# Patient Record
Sex: Male | Born: 1935 | Race: White | Hispanic: No | Smoking: Former smoker
Health system: Southern US, Community
[De-identification: ages and names within clinical notes are randomized; demographics above are authoritative.]

## PROBLEM LIST (undated history)

## (undated) DIAGNOSIS — N529 Male erectile dysfunction, unspecified: Secondary | ICD-10-CM

## (undated) DIAGNOSIS — E785 Hyperlipidemia, unspecified: Secondary | ICD-10-CM

## (undated) DIAGNOSIS — C679 Malignant neoplasm of bladder, unspecified: Secondary | ICD-10-CM

## (undated) DIAGNOSIS — I10 Essential (primary) hypertension: Secondary | ICD-10-CM

## (undated) DIAGNOSIS — R42 Dizziness and giddiness: Secondary | ICD-10-CM

## (undated) DIAGNOSIS — E7800 Pure hypercholesterolemia, unspecified: Secondary | ICD-10-CM

## (undated) DIAGNOSIS — N393 Stress incontinence (female) (male): Secondary | ICD-10-CM

## (undated) DIAGNOSIS — M1990 Unspecified osteoarthritis, unspecified site: Secondary | ICD-10-CM

## (undated) DIAGNOSIS — B029 Zoster without complications: Secondary | ICD-10-CM

## (undated) DIAGNOSIS — N135 Crossing vessel and stricture of ureter without hydronephrosis: Secondary | ICD-10-CM

## (undated) DIAGNOSIS — K219 Gastro-esophageal reflux disease without esophagitis: Secondary | ICD-10-CM

## (undated) DIAGNOSIS — R011 Cardiac murmur, unspecified: Secondary | ICD-10-CM

## (undated) DIAGNOSIS — C61 Malignant neoplasm of prostate: Secondary | ICD-10-CM

## (undated) DIAGNOSIS — F419 Anxiety disorder, unspecified: Secondary | ICD-10-CM

## (undated) HISTORY — DX: Pure hypercholesterolemia, unspecified: E78.00

## (undated) HISTORY — DX: Dizziness and giddiness: R42

## (undated) HISTORY — DX: Malignant neoplasm of bladder, unspecified: C67.9

## (undated) HISTORY — DX: Male erectile dysfunction, unspecified: N52.9

## (undated) HISTORY — DX: Hyperlipidemia, unspecified: E78.5

## (undated) HISTORY — PX: EYE SURGERY: SHX253

## (undated) HISTORY — PX: CYSTOSCOPY: SUR368

## (undated) HISTORY — DX: Malignant neoplasm of prostate: C61

## (undated) HISTORY — DX: Stress incontinence (female) (male): N39.3

## (undated) HISTORY — PX: COLONOSCOPY: SHX174

## (undated) HISTORY — PX: CYSTOSTOMY W/ BLADDER BIOPSY: SHX1431

## (undated) HISTORY — PX: INGUINAL HERNIA REPAIR: SUR1180

## (undated) HISTORY — PX: LYMPHADENECTOMY: SHX15

## (undated) HISTORY — DX: Zoster without complications: B02.9

## (undated) HISTORY — PX: BLADDER TUMOR EXCISION: SHX238

## (undated) HISTORY — DX: Essential (primary) hypertension: I10

## (undated) HISTORY — DX: Crossing vessel and stricture of ureter without hydronephrosis: N13.5

---

## 1991-06-20 DIAGNOSIS — C679 Malignant neoplasm of bladder, unspecified: Secondary | ICD-10-CM

## 1991-06-20 DIAGNOSIS — C61 Malignant neoplasm of prostate: Secondary | ICD-10-CM

## 1991-06-20 HISTORY — DX: Malignant neoplasm of prostate: C61

## 1991-06-20 HISTORY — PX: PROSTATECTOMY: SHX69

## 1991-06-20 HISTORY — DX: Malignant neoplasm of bladder, unspecified: C67.9

## 1997-12-18 ENCOUNTER — Other Ambulatory Visit: Admission: RE | Admit: 1997-12-18 | Discharge: 1997-12-18 | Payer: Self-pay | Admitting: Urology

## 2002-01-30 ENCOUNTER — Encounter (INDEPENDENT_AMBULATORY_CARE_PROVIDER_SITE_OTHER): Payer: Self-pay | Admitting: Specialist

## 2002-01-30 ENCOUNTER — Ambulatory Visit (HOSPITAL_BASED_OUTPATIENT_CLINIC_OR_DEPARTMENT_OTHER): Admission: RE | Admit: 2002-01-30 | Discharge: 2002-01-30 | Payer: Self-pay | Admitting: Urology

## 2003-01-02 HISTORY — PX: CARDIOVASCULAR STRESS TEST: SHX262

## 2003-08-06 ENCOUNTER — Ambulatory Visit (HOSPITAL_BASED_OUTPATIENT_CLINIC_OR_DEPARTMENT_OTHER): Admission: RE | Admit: 2003-08-06 | Discharge: 2003-08-06 | Payer: Self-pay | Admitting: Urology

## 2003-08-06 ENCOUNTER — Encounter (INDEPENDENT_AMBULATORY_CARE_PROVIDER_SITE_OTHER): Payer: Self-pay | Admitting: Specialist

## 2003-08-06 ENCOUNTER — Ambulatory Visit (HOSPITAL_COMMUNITY): Admission: RE | Admit: 2003-08-06 | Discharge: 2003-08-06 | Payer: Self-pay | Admitting: Urology

## 2007-09-17 ENCOUNTER — Encounter: Admission: RE | Admit: 2007-09-17 | Discharge: 2007-09-17 | Payer: Self-pay | Admitting: Urology

## 2007-09-19 ENCOUNTER — Ambulatory Visit (HOSPITAL_BASED_OUTPATIENT_CLINIC_OR_DEPARTMENT_OTHER): Admission: RE | Admit: 2007-09-19 | Discharge: 2007-09-19 | Payer: Self-pay | Admitting: Urology

## 2007-09-19 ENCOUNTER — Encounter (INDEPENDENT_AMBULATORY_CARE_PROVIDER_SITE_OTHER): Payer: Self-pay | Admitting: Urology

## 2007-10-03 ENCOUNTER — Encounter: Admission: RE | Admit: 2007-10-03 | Discharge: 2007-10-03 | Payer: Self-pay | Admitting: Gastroenterology

## 2008-01-20 ENCOUNTER — Ambulatory Visit (HOSPITAL_BASED_OUTPATIENT_CLINIC_OR_DEPARTMENT_OTHER): Admission: RE | Admit: 2008-01-20 | Discharge: 2008-01-20 | Payer: Self-pay | Admitting: Urology

## 2008-01-20 ENCOUNTER — Encounter (INDEPENDENT_AMBULATORY_CARE_PROVIDER_SITE_OTHER): Payer: Self-pay | Admitting: Urology

## 2008-08-13 ENCOUNTER — Encounter (INDEPENDENT_AMBULATORY_CARE_PROVIDER_SITE_OTHER): Payer: Self-pay | Admitting: Urology

## 2008-08-13 ENCOUNTER — Ambulatory Visit (HOSPITAL_BASED_OUTPATIENT_CLINIC_OR_DEPARTMENT_OTHER): Admission: RE | Admit: 2008-08-13 | Discharge: 2008-08-13 | Payer: Self-pay | Admitting: Urology

## 2010-01-20 ENCOUNTER — Ambulatory Visit: Payer: Self-pay | Admitting: Cardiology

## 2010-07-14 ENCOUNTER — Ambulatory Visit: Payer: Self-pay | Admitting: Cardiology

## 2010-10-04 LAB — POCT I-STAT, CHEM 8
BUN: 23 mg/dL (ref 6–23)
Calcium, Ion: 1.23 mmol/L (ref 1.12–1.32)
Creatinine, Ser: 1.4 mg/dL (ref 0.4–1.5)
Hemoglobin: 15.3 g/dL (ref 13.0–17.0)
TCO2: 24 mmol/L (ref 0–100)

## 2010-11-01 NOTE — Op Note (Signed)
NAME:  CHANDLER, SWIDERSKI NO.:  [id number]   MEDICAL RECORD NO.:  [id number]          PATIENT TYPE:  AMB   LOCATION:  [facility]                         FACILITY:  [facility]   PHYSICIAN:  Heloise Purpura, MD      DATE OF BIRTH:  10-Sep-1930   DATE OF PROCEDURE:  01/20/2008  DATE OF DISCHARGE:                               OPERATIVE REPORT   PREOPERATIVE DIAGNOSES:  1. Bladder cancer.  2. Urethral stricture.   POSTOPERATIVE DIAGNOSES:  1. Bladder cancer.  2. Urethral stricture.   PROCEDURE:  1. Cystoscopy.  2. Bilateral retrograde pyelography.  3. Bladder biopsy with removal of small bladder tumor transurethrally.   SURGEON:  Heloise Purpura, MD.   ANESTHESIA:  General.   COMPLICATIONS:  None.   SPECIMENS:  1. Bladder tumor.  2. Posterior bladder biopsy.   DISPOSITION OF SPECIMENS:  To pathology.   INDICATIONS:  Mr. 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  He recently underwent  surveillance cystoscopy in the office which demonstrated an erythematous  area within the bladder concerning for possible carcinoma in situ.  In  addition, he had a bladder washing for cytology that was found to be  suspicious for urothelial carcinoma.  It was therefore recommended that  he undergo a bladder biopsy.  The potential risks, complications, and  alternative options associated with this procedure were discussed with  the patient and informed consent was obtained.   DESCRIPTION OF PROCEDURE:  The patient was taken to the operating room  and a general anesthetic was administered.  He was given preoperative  antibiotics, placed in the dorsal lithotomy position, prepped and draped  in the usual sterile fashion.  Next, a preoperative time-out was  performed.  Cystourethroscopy was then performed.  The patient was noted  to have a small urethral stricture, although this was able to be  bypassed with the 22-French cystoscope  sheath.  The bladder was then  systematically examined and demonstrated some moderate-sized cellules,  as well as moderate trabeculation throughout the bladder.  There was  noted to be an erythematous area in the posterior aspect of the bladder  consistent with the patient's office cystoscopy.  However, in addition,  there was noted to be a very small papillary tumor off the right lateral  aspect of the bladder just above the trigone.  This tumor measured less  than 0.5 cm.  Bilateral retrograde pyelography was then performed and  the ureteral orifices were intubated with a cone-tip catheter.  The left  ureteral orifice was first intubated and contrast was injected which  demonstrated no evidence of filling defects or abnormalities of the  ureter.  The renal collecting system also demonstrated no evidence of  filling defects or dilation.  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  On the right side, contrast was injected through the cone-tip  catheter via the ureteral orifice and again, no ureteral filling defects  or abnormalities were identified.  There was noted to be some splaying  of the collecting system extrinsically, but  no filling defects on the  urothelial surface or other abnormalities except again, for what  appeared to be a bifid collecting system.  Attention then returned to  the bladder and the previously mentioned bladder tumor was  transurethrally resected with the cold cup biopsy forceps including deep  section to ensure muscle was resected below this area.  The previously  mentioned erythematous area was also biopsied.  The Bugbee electrode was  then used to fulgurate each of these areas along with a surrounding  erythema.  The bladder was emptied and re-inspected.  Hemostasis was  ensured.  The bladder was then emptied and the cystoscope was removed.  The patient tolerated the procedure well and without complications.  Although, I would of ideally liked to  administer intravesical mitomycin  C in this situation, it is not currently available due to a regional  shortage.  Therefore, the patient was transferred to the recovery unit  without a catheter and in stable condition.      Heloise Purpura, MD  Electronically Signed     LB/MEDQ  D:  01/20/2008  T:  01/20/2008  Job:  [id number]

## 2010-11-01 NOTE — Op Note (Signed)
NAMEMarland Kitchen  VIBHAV, Robert Bright NO.:  [id number]   MEDICAL RECORD NO.:  [id number]          PATIENT TYPE:  AMB   LOCATION:  [facility]                         FACILITY:  [facility]   PHYSICIAN:  Heloise Purpura, MD      DATE OF BIRTH:  17-Jul-1930   DATE OF PROCEDURE:  09/19/2007  DATE OF DISCHARGE:                               OPERATIVE REPORT   PREOPERATIVE DIAGNOSES:  1. Bladder cancer.  2. 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   POSTOPERATIVE DIAGNOSES:  1. Bladder cancer.  2. 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   PROCEDURES:  1. Cystoscopy.  2. Balloon dilation of bladder neck contracture.  3. Bilateral retrograde pyelography.  4. Transurethral resection of small bladder tumor.  5. Bladder washing for cytology.  6. Eamination under anesthesia.   SURGEON:  Heloise Purpura, M.D.   ANESTHESIA:  General.   COMPLICATIONS:  None.   ESTIMATED BLOOD LOSS:  Minimal.   SPECIMENS:  Left lateral bladder tumor.   DISPOSITION OF SPECIMEN:  To pathology.   INDICATIONS FOR PROCEDURE:  Robert Bright is a 77 year old gentleman with a  history of prostate and bladder cancer.  He last underwent a  transurethral resection of his bladder tumor in 2005.  He also has a  history of a severe bladder neck contracture as a result of his radical  prostatectomy.  He, therefore, is unable to undergo surveillance  cystoscopy in the office.  After evaluating him recently, I recommended  that we proceed with surveillance cystoscopy at this time and he,  therefore, gave informed consent after discussing the risks, benefits,  potential complications, and alternative options.   DESCRIPTION OF PROCEDURE:  The patient was taken to the operating room  and a general anesthetic was administered. He was given preoperative  antibiotics, placed in the dorsal lithotomy position and prepped and  draped in the usual sterile fashion.   Next, a preoperative time-out was  performed.  Urethroscopy was then performed which demonstrated a normal  anterior urethra.  On inspection of the bladder neck there was noted to  be a very tight bladder neck contracture.  A sensor guide wire was  advanced past this bladder neck contracture and into the bladder under  fluoroscopic guidance.  The Ultraxx balloon dilator was then used to  balloon dilate the bladder neck contracture to 24 [place].  This dilated  easily and without resistance.  The balloon was left inflated for  approximately 2-1/2 minutes. The balloon was then deflated and  urethroscopy revealed a patent bladder neck and entry into the bladder  was performed without difficulty.   The bladder was then systematically examined which demonstrated the  ureteral orifices in the expected positions and effluxing clear urine.  There was noted to be a little bit of a mucosal bridge in the midline of  the trigone, likely a postoperative change from his radical  prostatectomy.  On inspection of the bladder, there was noted to be what  appeared to be  a low-grade bladder tumor on the left lateral wall  measuring approximately 1.5 cm.  No other mucosal lesions were  identified. A saline bladder washing was obtained.  The left ureteral  orifice was intubated with a cone tipped catheter and contrast was  injected.  Retrograde pyelography demonstrated no evidence of any  filling defects or dilation within the renal collecting system or  ureter.  An identical procedure was then performed on the contralateral  side and again no filling defects or evidence of dilation was noted in  the renal collecting system or ureter.   Attention then returned to the bladder.  Using the cold cup forceps, the  patient's bladder tumor was transurethrally resected and removed in its  entirety.  The resection site was then fulgurated with the Bugbee  electrode. The bladder was emptied and reinspected and there  was no  evidence of any bleeding. Pelvic exam revealed no pelvic or bladder  masses. A 16 French Foley catheter was then replaced and 40 mg of  Mitomycin and 20 mL of sterile water was instilled into the patient's  bladder for postoperative instillation.  The patient tolerated the  procedure well and without complications.  He was able to be awakened  and transferred to the recovery unit in satisfactory condition.      Heloise Purpura, MD  Electronically Signed     LB/MEDQ  D:  09/19/2007  T:  09/19/2007  Job:  [id number]

## 2010-11-01 NOTE — Op Note (Signed)
NAME:  Robert Bright, Robert Bright NO.:  [id number]   MEDICAL RECORD NO.:  [id number]          PATIENT TYPE:  AMB   LOCATION:  [facility]                         [facility]ACILITY:  [facility]   PHYSICIAN:  Heloise Purpura, MD      DATE OF BIRTH:  [birth year]   DATE OF PROCEDURE:  08/13/2008  DATE OF DISCHARGE:                               OPERATIVE REPORT   PREOPERATIVE DIAGNOSES:  1. Bladder neck contracture.  2. Bladder cancer.   POSTOPERATIVE DIAGNOSES:  1. Bladder neck contracture.  2. Bladder cancer.   PROCEDURES:  1. Cystoscopy.  2. Saline bladder washing for cytology.  3. Bilateral retrograde pyelography with interpretation.  4. Bladder biopsies with fulguration.  5. Balloon dilation of bladder neck contracture.   SURGEON:  Heloise Purpura, MD.   ANESTHESIA:  General.   COMPLICATIONS:  None.   INDICATION:  Robert Bright is a 77 year old gentleman with a history of  prostate cancer, status post a radical retropubic prostatectomy, and  subsequent development of a bladder neck contracture.  He also has a  known history of low-grade TA urothelial carcinoma of the bladder.  Due  to his recurrent bladder neck contracture, he has been unable to undergo  recent office cystoscopic surveillance for his bladder cancer.  Therefore, it was recommended that he undergo balloon dilation of his  bladder neck contracture to allow surveillance cystoscopy and possible  bladder biopsies and evaluation of his urothelium as indicated.  The  potential risks, complications, and alternative treatment options  associated with the above procedures were discussed in detail with the  patient and informed consent was obtained.   DESCRIPTION OF PROCEDURE:  The patient was taken to the operating room  and a general anesthetic was administered.  He was given preoperative  antibiotics, placed in the dorsal lithotomy position, and prepped and  draped in the usual sterile fashion.  Next a preoperative time-out was  performed.  Cystourethroscopy was then attempted with a 22-French  cystoscope sheath.  The anterior urethra appeared normal.  The patient  was noted to have a high bladder neck contracture that would not allow  the 22-French cystoscope sheath to pass into the bladder.  Therefore, a  sensor guidewire was passed through the cystoscope and into the bladder  under fluoroscopic guidance.  The wire was confirmed to be in the  appropriate position and the 24-French Ultraxx balloon dilator was  passed over the wire and positioned at the bladder neck.  It was then  inflated to 14 atmospheres of pressure, which eliminated all waste from  the balloon.  This was left inflated for 5 minutes.  The balloon was  then deflated and the 22-French cystoscope sheath was able to be passed  easily into the bladder.  The bladder was then systematically examined.  The left ureteral orifice was noted to be in the normal anatomic  position and was effluxing clear urine.  The right ureteral orifice  appeared to be in its normal position, although somewhat wide mouthed.  No clear efflux was seen from this orifice.  A systematic examination of  the bladder revealed a  small papillary lesion at the trigone of the  bladder concerning for, although not necessarily diagnostic for a low-  grade bladder tumor.  This mass appeared to be approximately 1 cm.  There was also noted to be a slightly erythematous area over a previous  biopsy resection site off the left lateral bladder wall.  No other  suspicious lesions were noted.  A saline bladder washing was then  obtained.  A cone-tip catheter was then used to intubate the left  ureteral orifice and Omnipaque contrast was injected.  This demonstrated  no filling defects within the renal pelvis or ureter and no other  abnormalities or dilation appreciated.  Attention then turned to what  was thought to be the right ureteral orifice and this was found to be a  mucosal tissue  bridge without a true orifice.  On further inspection,  the actual ureteral orifice was noted to be displaced laterally, which  appeared to be related to his prior bladder tumor resections.  This was  able to be cannulated with the cone-tip catheter and contrast was  injected.  This demonstrated some splaying of the renal collecting  system as has been previously noted.  However, there were not noted to  be any filling defects or other abnormalities of the renal collecting  system or ureter.  Attention then returned to the bladder.  The  previously mentioned papillary lesion was biopsied, which allowed its  removal in its entirety.  The left lateral erythematous area was also  biopsied and both of these were sent for permanent pathologic analysis  as separate specimens.  These areas were then fulgurated with a Bugbee  electrode.  The bladder was then emptied and reinspected.  Hemostasis  appeared excellent.  Based on ease of the dilation of his bladder neck,  it was decided not to leave a Foley catheter.  Although the patient was  suspected to have a possible bladder cancer recurrence, this was not  definite diagnosis and I did not wish to administer postoperative  intravesical chemotherapy.  In addition, there is currently a shortage  of mitomycin C, which would have made this an impossibility at this  time.   DISPOSITION:  Robert Bright will follow up in the next 2-3 weeks for further  postoperative evaluation and to discuss his pathology results.      Heloise Purpura, MD  Electronically Signed     LB/MEDQ  D:  08/13/2008  T:  08/14/2008  Job:  [id number]

## 2010-11-04 NOTE — Op Note (Signed)
Robert Bright, Penley                ACCOUNT NO.:  [id number]   MEDICAL RECORD NO.:  [id number]                    PATIENT TYPE:   LOCATION:                                       FACILITY:   PHYSICIAN:  Claudette Laws, M.D.               DATE OF BIRTH:   DATE OF PROCEDURE:  01/30/2002  DATE OF DISCHARGE:                                 OPERATIVE REPORT   PREOPERATIVE DIAGNOSES:  1. Bladder neck contracture.  2. Hematuria.  3. Remote history of radical prostatectomy, rule out bladder tumor.   POSTOPERATIVE DIAGNOSES:  1. Bladder neck contracture.  2. Hematuria.  3. Remote history of radical prostatectomy, rule out bladder tumor.  4. Papillary bladder tumor posterior wall.   OPERATION:  Cystoscopy and dilate bladder neck contracture cystoscopy and  cold cup biopsy medium size bladder tumor along with Bugbee fulguration  bladder tumor.   SURGEON:  Claudette Laws, M.D.   HISTORY:  This is a 77 year old man who had a radical prostatectomy  approximately 10 years ago for prostate cancer. Since then, he has done well  although recently we noted some hematuria. Urine cytology was suggestive of  abnormal cells; however, cystoscopic evaluation in the office revealed a  bladder neck contracture and therefore he was brought in as an outpatient  for further evaluation.   DESCRIPTION OF PROCEDURE:  The patient was prepped and draped in the dorsal  lithotomy position under intubated general anesthesia. Cystoscopy showed a  normal anterior urethra but a surgically absent prostate and a bladder neck  contracture which I was able to intubate with a 0.38 guidewire. Over the  guidewire, I passed [employer] sounds and we were able to dilate him to a #26  [place]. There was some hang-up at the bladder neck.   Then I passed the 22 French cystoscope along side the guidewire and was able  to intubate the bladder then. Careful inspection revealed a markedly  trabeculated bladder and up  along the posterior wall to the right side was a  papillary bladder tumor. We took pictures and then using the cold cup biopsy  took some appropriate deep biopsies. I then converted to a Bugbee electrode  and at 40 coag fulgurated the area thoroughly. There was no other evidence  of any tumors or suspicious areas.   We then passed a 20 [place]-tip catheter over the guidewire into the  bladder, irrigated freely, urine was clear. A B&O suppository was placed per  rectum for anesthesia and the patient was then taken back to the recovery  room in satisfactory condition.                                               Claudette Laws, M.D.   RFS/MEDQ  D:  01/30/2002  T:  02/01/2002  Job:  [id number]

## 2010-11-04 NOTE — Op Note (Signed)
NAME:  Robert Bright, Robert Bright                         ACCOUNT NO.:  [id number]   MEDICAL RECORD NO.:  [id number]                   PATIENT TYPE:  AMB   LOCATION:  [facility]                                 [facility]ACILITY:  [facility]   PHYSICIAN:  Claudette Laws, M.D.               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   SURGEON:  Claudette Laws, M.D.   ASSISTANT:  Susanne Borders, MD   PROCEDURE:  Cystoscopy, balloon dilation of bladder neck contracture,  transurethral resection of bladder tumor, small, bladder biopsies x2.   ANESTHESIA:  Laryngeal mask airway.   COMPLICATIONS:  None.   SPECIMENS:  1. Bladder dome tumor.  2. Midline bladder biopsy.  3. Left lateral wall bladder biopsy.   ESTIMATED BLOOD LOSS:  Minimal.   DISPOSITION:  To recovery room in stable condition.   BRIEF INDICATIONS FOR PROCEDURE:  Robert Bright is a 77 year old male with a  history of radical retropubic prostatectomy several years ago.  He has a  known bladder neck contracture.  The patient also has a history of a low  grade superficial bladder tumor.  Recently the patient had recurrence of  microhematuria.  He has been unable to undergo flexible cystoscopy in the  office because of his bladder neck contracture. For this reason, he was set  up for balloon dilation of his bladder neck contracture in the operating  room with cystoscopy and possible transurethral resection of bladder tumor.  The patient has consented to this after understanding the risks, benefits,  and alternatives.   DESCRIPTION OF PROCEDURE:  The patient was brought to the operating room and  correctly identified by his identification bracelet. He was given  preoperative antibiotics and general endotracheal anesthesia.  He was placed  in dorsal lithotomy position and prepped and draped in typical sterile  fashion.  A 22 French cystoscope with 12 degree lens was used to enter the  patient's anterior urethra.  At the level of the membranous urethra, there  was an obvious bladder neck contracture at the anastomosis to his bladder.  The cystoscope could not be passed through this tight area.  A 0.38 mm  guidewire was carefully guided through the strictured area and into the  bladder. A balloon dilator was then threaded over the guidewire and inflated  at 15 mL of water for 2 minutes. The balloon was then released and the  balloon dilator was removed.  The cystoscope could then easily be placed  through the bladder neck contracture and into the  bladder.  The ureteral  orifices were identified in their normal anatomic location. The patient did  have some trabeculation throughout his bladder. On the left lateral wall,  there was an area of heaped up mucosa that had the appearance of cystitis  cystica.  There was also an erythematous area of mucosa directly posterior  in the midline.  At the dome of the bladder, there was a small papillary  lesion consistent with a low grade transitional cell carcinoma.  There were  no other noted abnormalities within the bladder.  Each of these areas was  carefully biopsied with the cold cup biopsy forceps. The tumor at the dome  of the bladder was grasped multiple times with the cold cup biopsy forceps.  The area at the dome of the bladder was then carefully fulgurated with the  Bugbee electrode. The entire surface area of this lesion was less than 2 cm.  The other areas were then carefully fulgurated with the Bugbee as well.  After fulguration, there was no bleeding noted  with the flow off. The  patient's bladder was then drained and the cystoscope was replaced within  the bladder and the lesions were carefully inspected for bleeding and none  was identified.  The patient's bladder was then drained and his urethra was  injected with 10 mL of viscus lidocaine. A B&O suppository was administered.  The patient was awakened from his anesthesia without complications and taken  to the post anesthesia care unit in stable condition.  Please note that Dr.  Etta Bright was present and participated in all aspects of this case as he is the  primary surgeon.     Susanne Borders, MD                           Claudette Laws, M.D.    DR/MEDQ  D:  08/06/2003  T:  08/06/2003  Job:  [id number]

## 2010-11-10 ENCOUNTER — Telehealth: Payer: Self-pay | Admitting: Cardiology

## 2010-11-10 NOTE — Telephone Encounter (Signed)
No answer

## 2010-11-10 NOTE — Telephone Encounter (Signed)
WANTS TO KNOW IF YOU CAN GET HIM A AUDIOLOGY REFERRAL AT [facility] FOR HIM.

## 2010-11-10 NOTE — Telephone Encounter (Signed)
Spoke with patient, will call in am to schedule

## 2010-11-17 ENCOUNTER — Telehealth: Payer: Self-pay | Admitting: *Deleted

## 2010-11-17 NOTE — Telephone Encounter (Signed)
Adv. Patient of audiology appt. 11/23/2010 at 3:00 with [facility] located [address].

## 2010-11-23 ENCOUNTER — Ambulatory Visit: Payer: Self-pay | Admitting: Unknown Physician Specialty

## 2010-12-27 ENCOUNTER — Ambulatory Visit: Payer: Medicare Other | Attending: Cardiology | Admitting: Audiology

## 2010-12-27 DIAGNOSIS — H903 Sensorineural hearing loss, bilateral: Secondary | ICD-10-CM | POA: Insufficient documentation

## 2011-01-16 ENCOUNTER — Encounter: Payer: Self-pay | Admitting: Cardiology

## 2011-01-17 ENCOUNTER — Encounter: Payer: Self-pay | Admitting: Cardiology

## 2011-01-18 ENCOUNTER — Encounter: Payer: Self-pay | Admitting: Cardiology

## 2011-01-18 ENCOUNTER — Ambulatory Visit (INDEPENDENT_AMBULATORY_CARE_PROVIDER_SITE_OTHER): Payer: Medicare Other | Admitting: Cardiology

## 2011-01-18 ENCOUNTER — Ambulatory Visit (INDEPENDENT_AMBULATORY_CARE_PROVIDER_SITE_OTHER): Payer: Medicare Other | Admitting: *Deleted

## 2011-01-18 DIAGNOSIS — C61 Malignant neoplasm of prostate: Secondary | ICD-10-CM

## 2011-01-18 DIAGNOSIS — E878 Other disorders of electrolyte and fluid balance, not elsewhere classified: Secondary | ICD-10-CM

## 2011-01-18 DIAGNOSIS — I119 Hypertensive heart disease without heart failure: Secondary | ICD-10-CM

## 2011-01-18 DIAGNOSIS — E785 Hyperlipidemia, unspecified: Secondary | ICD-10-CM

## 2011-01-18 DIAGNOSIS — I10 Essential (primary) hypertension: Secondary | ICD-10-CM

## 2011-01-18 DIAGNOSIS — E7800 Pure hypercholesterolemia, unspecified: Secondary | ICD-10-CM

## 2011-01-18 DIAGNOSIS — Z8551 Personal history of malignant neoplasm of bladder: Secondary | ICD-10-CM | POA: Insufficient documentation

## 2011-01-18 DIAGNOSIS — Z8546 Personal history of malignant neoplasm of prostate: Secondary | ICD-10-CM | POA: Insufficient documentation

## 2011-01-18 LAB — CBC WITH DIFFERENTIAL/PLATELET
Basophils Relative: 0.4 % (ref 0.0–3.0)
Eosinophils Relative: 3.3 % (ref 0.0–5.0)
HCT: 44 % (ref 39.0–52.0)
Hemoglobin: 14.8 g/dL (ref 13.0–17.0)
Lymphs Abs: 2.3 10*3/uL (ref 0.7–4.0)
MCV: 90.3 fl (ref 78.0–100.0)
Monocytes Absolute: 0.7 10*3/uL (ref 0.1–1.0)
Neutro Abs: 3.8 10*3/uL (ref 1.4–7.7)
Neutrophils Relative %: 53.5 % (ref 43.0–77.0)
RBC: 4.87 Mil/uL (ref 4.22–5.81)
WBC: 7.1 10*3/uL (ref 4.5–10.5)

## 2011-01-18 NOTE — Assessment & Plan Note (Signed)
The patient is seen for followup of his essential hypertension.  He has been feeling well.  He is not experiencing any dizziness or syncope.  He denies any chest pain or shortness of breath.  He is not having any side effects from his medication

## 2011-01-18 NOTE — Progress Notes (Signed)
Harriette Bouillon Date of Birth:  05/24/1931 [facility] / [facility] [address]   [address] [place], [place]  [address] [phone]           Fax   [phone]  History of Present Illness: This pleasant gentleman is seen for a six-month followup office visit.  He has a past history of essential hypertension and hypercholesterolemia.  He has not been expressing any recent chest pain or shortness of breath.  He does not have known ischemic heart disease and had a normal nuclear stress test in 2004.  Recently he's been having some problems with nasal obstruction of his left nostril.  We checked it and appears to have some swelling of his nasal turbinates and he may benefit from seeing an ENT physician.  He knows Dr. Suzanna Obey and he may call us to refer him to Dr. Jearld Fenton which will be fine.  At first the patient will try some over-the-counter Claritin to see if that will help.  Current Outpatient Prescriptions  Medication Sig Dispense Refill  . amLODipine (NORVASC) 5 MG tablet Take 5 mg by mouth daily.        [name] aspirin 81 MG tablet Take 81 mg by mouth daily.        [name] atorvastatin (LIPITOR) 80 MG tablet Take 80 mg by mouth daily.        [name] CALCIUM PO Take by mouth.        . Cholecalciferol (VITAMIN D PO) Take 1,000 mg by mouth daily.        . ferrous sulfate 325 (65 FE) MG tablet Take 325 mg by mouth daily.        . hydrochlorothiazide 25 MG tablet Take 25 mg by mouth every other day.        [name] LORazepam (ATIVAN) 0.5 MG tablet Take 0.5 mg by mouth as needed.        . MECLIZINE HCL PO Take by mouth.        . metoprolol tartrate (LOPRESSOR) 25 MG tablet Take 25 mg by mouth as needed.        . Multiple Vitamin (MULTIVITAMIN PO) Take by mouth.        . valsartan (DIOVAN) 80 MG tablet Take 80 mg by mouth daily.          Allergies  Allergen Reactions  . Altace     cough    Patient Active Problem List  Diagnoses  . Hypercholesterolemia  . Benign hypertensive heart  disease without heart failure  . Hx of bladder cancer  . History of prostate cancer    History  Smoking status  . Former Smoker  . Quit date: 01/16/1986  Smokeless tobacco  . Not on file    History  Alcohol Use No    No family history on file.  Review of Systems: Constitutional: no fever chills diaphoresis or fatigue or change in weight.  Head and neck: no hearing loss, no epistaxis, no photophobia or visual disturbance. Respiratory: No cough, shortness of breath or wheezing. Cardiovascular: No chest pain peripheral edema, palpitations. Gastrointestinal: No abdominal distention, no abdominal pain, no change in bowel habits hematochezia or melena. Genitourinary: No dysuria, no frequency, no urgency, no nocturia. Musculoskeletal:No arthralgias, no back pain, no gait disturbance or myalgias. Neurological: No dizziness, no headaches, no numbness, no seizures, no syncope, no weakness, no tremors. Hematologic: No lymphadenopathy, no easy bruising. Psychiatric: No confusion, no hallucinations, no sleep disturbance.    Physical Exam:  Filed Vitals:   01/18/11 0839  BP: 110/65  Pulse: 66  The general appearance reveals a well-developed well-nourished gentleman in no distress.Pupils equal and reactive.   Extraocular Movements are full.  There is no scleral icterus.The nose reveals that the turbinates appear to be somewhat swollen and there appears to be limited room for air passage particularly on the left  The mouth and pharynx are normal.  The neck is supple.  The carotids reveal no bruits.  The jugular venous pressure is normal.  The thyroid is not enlarged.  There is no lymphadenopathy.The chest is clear to percussion and auscultation. There are no rales or rhonchi. Expansion of the chest is symmetrical.  The precordium is quiet.  The first heart sound is normal.  The second heart sound is physiologically split.  There is no murmur gallop rub or click.  There is no abnormal lift or  heave.  The abdomen is soft and nontender. Bowel sounds are normal. The liver and spleen are not enlarged. There Are no abdominal masses. There are no bruits.  The pedal pulses are good.  There is no phlebitis or edema.  There is no cyanosis or clubbing.  Strength is normal and symmetrical in all extremities.  There is no lateralizing weakness.  There are no sensory deficits.       Assessment / Plan:  Continue same medication.  Lab work is pending.  Recheck in 6 months for office visit and fasting lab work

## 2011-01-18 NOTE — Assessment & Plan Note (Signed)
The patient has a history of bladder cancer as well as previous prostate cancer and both of these are being followed by his urologist Dr. Laverle Patter.  The patient states that his recent reports have been good.

## 2011-01-18 NOTE — Assessment & Plan Note (Signed)
The patient has a history of hypercholesterolemia.  He is on Lipitor 80 mg daily.  He is not having any myalgias or other side effects from his statin therapy.

## 2011-01-19 LAB — COMPREHENSIVE METABOLIC PANEL
Alkaline Phosphatase: 48 U/L (ref 39–117)
BUN: 29 mg/dL — ABNORMAL HIGH (ref 6–23)
Glucose, Bld: 78 mg/dL (ref 70–99)
Total Bilirubin: 0.8 mg/dL (ref 0.3–1.2)

## 2011-01-30 ENCOUNTER — Telehealth: Payer: Self-pay | Admitting: *Deleted

## 2011-01-30 NOTE — Telephone Encounter (Signed)
Advised patient of labs and mailed copy.Patient will have lipids done at Dec. appt

## 2011-02-24 ENCOUNTER — Other Ambulatory Visit: Payer: Self-pay | Admitting: Cardiology

## 2011-02-24 DIAGNOSIS — E7800 Pure hypercholesterolemia, unspecified: Secondary | ICD-10-CM

## 2011-02-24 DIAGNOSIS — I119 Hypertensive heart disease without heart failure: Secondary | ICD-10-CM

## 2011-03-14 LAB — POCT I-STAT, CHEM 8
Creatinine, Ser: 1.5
Glucose, Bld: 85
Hemoglobin: 15
Potassium: 4.3

## 2011-03-17 LAB — POCT I-STAT 4, (NA,K, GLUC, HGB,HCT): Glucose, Bld: 88

## 2011-05-22 ENCOUNTER — Ambulatory Visit: Payer: Medicare Other | Admitting: *Deleted

## 2011-05-22 ENCOUNTER — Ambulatory Visit (INDEPENDENT_AMBULATORY_CARE_PROVIDER_SITE_OTHER): Payer: Medicare Other | Admitting: Cardiology

## 2011-05-22 ENCOUNTER — Encounter: Payer: Self-pay | Admitting: Cardiology

## 2011-05-22 ENCOUNTER — Other Ambulatory Visit: Payer: Medicare Other | Admitting: *Deleted

## 2011-05-22 ENCOUNTER — Ambulatory Visit: Payer: Medicare Other | Admitting: Cardiology

## 2011-05-22 ENCOUNTER — Other Ambulatory Visit (INDEPENDENT_AMBULATORY_CARE_PROVIDER_SITE_OTHER): Payer: Medicare Other | Admitting: *Deleted

## 2011-05-22 VITALS — BP 134/70 | HR 66 | Wt 171.0 lb

## 2011-05-22 DIAGNOSIS — N529 Male erectile dysfunction, unspecified: Secondary | ICD-10-CM

## 2011-05-22 DIAGNOSIS — E7800 Pure hypercholesterolemia, unspecified: Secondary | ICD-10-CM

## 2011-05-22 DIAGNOSIS — F411 Generalized anxiety disorder: Secondary | ICD-10-CM

## 2011-05-22 DIAGNOSIS — E785 Hyperlipidemia, unspecified: Secondary | ICD-10-CM

## 2011-05-22 DIAGNOSIS — I119 Hypertensive heart disease without heart failure: Secondary | ICD-10-CM

## 2011-05-22 DIAGNOSIS — Z8546 Personal history of malignant neoplasm of prostate: Secondary | ICD-10-CM

## 2011-05-22 DIAGNOSIS — F419 Anxiety disorder, unspecified: Secondary | ICD-10-CM

## 2011-05-22 LAB — BASIC METABOLIC PANEL
GFR: 50.34 mL/min — ABNORMAL LOW (ref >60.00)
Glucose, Bld: 90 mg/dL (ref 70–99)
Potassium: 4.3 mEq/L (ref 3.5–5.1)
Sodium: 141 mEq/L (ref 135–145)

## 2011-05-22 LAB — HEPATIC FUNCTION PANEL
ALT: 24 U/L (ref 0–53)
Albumin: 3.9 g/dL (ref 3.5–5.2)
Total Bilirubin: 0.4 mg/dL (ref 0.3–1.2)
Total Protein: 7.1 g/dL (ref 6.0–8.3)

## 2011-05-22 LAB — LIPID PANEL
Cholesterol: 195 mg/dL (ref 0–200)
HDL: 59.1 mg/dL (ref >39.00)
VLDL: 28.8 mg/dL (ref 0.0–40.0)

## 2011-05-22 MED ORDER — SILDENAFIL CITRATE 50 MG PO TABS: 50.0000 mg | ORAL_TABLET | Freq: Every day | ORAL | Status: DC | PRN

## 2011-05-22 MED ORDER — LORAZEPAM 0.5 MG PO TABS: 0.5000 mg | ORAL_TABLET | ORAL | Status: DC | PRN

## 2011-05-22 MED ORDER — HYDROCHLOROTHIAZIDE 25 MG PO TABS: 25.0000 mg | ORAL_TABLET | ORAL | Status: DC

## 2011-05-22 MED ORDER — AMLODIPINE BESYLATE 5 MG PO TABS: 5.0000 mg | ORAL_TABLET | Freq: Every day | ORAL | Status: DC

## 2011-05-22 MED ORDER — ATORVASTATIN CALCIUM 40 MG PO TABS: 40.0000 mg | ORAL_TABLET | Freq: Every day | ORAL | Status: DC

## 2011-05-22 MED ORDER — VALSARTAN 80 MG PO TABS: 80.0000 mg | ORAL_TABLET | Freq: Every day | ORAL | Status: DC

## 2011-05-22 NOTE — Patient Instructions (Addendum)
Will obtain labs today and call you with the results Decrease Lipitor to 40 mg daily. Your physician wants you to follow-up in: 4 months with fasting labs You will receive a reminder letter in the mail two months in advance. If you don't receive a letter, please call our office to schedule the follow-up appointment.

## 2011-05-22 NOTE — Progress Notes (Signed)
Robert Bright Date of Birth:  Nov 21, 1930 [facility] / [facility] [address]   [address] [place], [place]  [address] [phone]           Fax   [phone]  HPI: This pleasant 77 year old gentleman is seen for a scheduled followup office visit.  He has a history of essential hypertension and history of hypercholesterolemia.  Since last visit has had no chest pain.  He's had no dizzy spells or shortness of breath.  He walks daily for exercise.  Current Outpatient Prescriptions  Medication Sig Dispense Refill  . amLODipine (NORVASC) 5 MG tablet Take 1 tablet (5 mg total) by mouth daily.  30 tablet  11  . aspirin 81 MG tablet Take 81 mg by mouth daily.        [name] atorvastatin (LIPITOR) 40 MG tablet Take 1 tablet (40 mg total) by mouth daily.  30 tablet  11  . CALCIUM PO Take by mouth.        . Cholecalciferol (VITAMIN D PO) Take 1,000 mg by mouth daily.        . ferrous sulfate 325 (65 FE) MG tablet Take 325 mg by mouth daily.        . hydrochlorothiazide (HYDRODIURIL) 25 MG tablet Take 1 tablet (25 mg total) by mouth every other day.  30 tablet  11  . LORazepam (ATIVAN) 0.5 MG tablet Take 0.5 mg by mouth as needed.        . MECLIZINE HCL PO Take by mouth.        . metoprolol tartrate (LOPRESSOR) 25 MG tablet Take 25 mg by mouth as needed.        . Multiple Vitamin (MULTIVITAMIN PO) Take by mouth.        . valsartan (DIOVAN) 80 MG tablet Take 1 tablet (80 mg total) by mouth daily.  30 tablet  11    Allergies  Allergen Reactions  . Altace     cough    Patient Active Problem List  Diagnoses  . Hypercholesterolemia  . Benign hypertensive heart disease without heart failure  . Hx of bladder cancer  . History of prostate cancer    History  Smoking status  . Former Smoker  . Quit date: 01/16/1986  Smokeless tobacco  . Not on file    History  Alcohol Use No    No family history on file.  Review of Systems: The patient denies any heat or cold  intolerance.  No weight gain or weight loss.  The patient denies headaches or blurry vision.  There is no cough or sputum production.  The patient denies dizziness.  There is no hematuria or hematochezia.  The patient denies any muscle aches or arthritis.  The patient denies any rash.  The patient denies frequent falling or instability.  There is no history of depression or anxiety.  All other systems were reviewed and are negative.   Physical Exam: Filed Vitals:   05/22/11 0901  BP: 134/70  Pulse: 66   Gen. appearance reveals a well-developed well-nourished gentleman in no distress.The head and neck exam reveals pupils equal and reactive.  Extraocular movements are full.  There is no scleral icterus.  The mouth and pharynx are normal.  The neck is supple.  The carotids reveal no bruits.  The jugular venous pressure is normal.  The  thyroid is not enlarged.  There is no lymphadenopathy.  The chest is clear to percussion and auscultation.  There are  no rales or rhonchi.  Expansion of the chest is symmetrical.  The precordium is quiet.  The first heart sound is normal.  The second heart sound is physiologically split.  There is no murmur gallop rub or click.  There is no abnormal lift or heave.  The abdomen is soft and nontender.  The bowel sounds are normal.  The liver and spleen are not enlarged.  There are no abdominal masses.  There are no abdominal bruits.  Extremities reveal good pedal pulses.  There is no phlebitis or edema.  There is no cyanosis or clubbing.  Strength is normal and symmetrical in all extremities.  There is no lateralizing weakness.  There are no sensory deficits.  The skin is warm and dry.  There is no rash.     Assessment / Plan: Continue same medication except we are decreasing his Lipitor from 80 mg to 40 mg daily.  Also trial of Viagra. Recheck in 4 months for followup office visit and fasting lab work

## 2011-05-22 NOTE — Assessment & Plan Note (Signed)
Patient is on Lipitor 40 mg daily.  He denies any myalgias or side effects from the Lipitor.

## 2011-05-22 NOTE — Assessment & Plan Note (Signed)
The patient has had some symptoms of erectile dysfunction.  He is requesting Viagra and we gave her a prescription for Viagra 50 mg tablets #6 with refills

## 2011-05-22 NOTE — Assessment & Plan Note (Signed)
No headaches or dizzy spells.  No symptoms of CHF.

## 2011-05-23 ENCOUNTER — Telehealth: Payer: Self-pay | Admitting: *Deleted

## 2011-05-23 NOTE — Telephone Encounter (Signed)
Message copied by Burnell Blanks on Tue May 23, 2011  5:55 PM ------      Message from: Cassell Clement      Created: Tue May 23, 2011  4:10 PM       Please report.  The kidney function is stable.  The liver tests are good.  The LDL cholesterol was borderline high at 107.  Continue same medication and watch diet careful

## 2011-05-23 NOTE — Telephone Encounter (Signed)
Advised of labs 

## 2011-05-31 ENCOUNTER — Telehealth: Payer: Self-pay | Admitting: Cardiology

## 2011-05-31 NOTE — Telephone Encounter (Signed)
Spoke with patient and he was concerned about his daughter and steroid injection.  He had already found out the answer when called him back.

## 2011-05-31 NOTE — Telephone Encounter (Signed)
He has questions about the steriods that where shipped in to [facility]

## 2011-06-05 ENCOUNTER — Telehealth: Payer: Self-pay | Admitting: Cardiology

## 2011-06-05 NOTE — Telephone Encounter (Signed)
New message:  Needs pres for ear drops called into [facility] on [address].  His number is [phone]

## 2011-06-07 NOTE — Telephone Encounter (Signed)
Just received this message today and called patient.  He stated  Dr. Patty Sermons had given him neomycin ear drops several years ago for earache.  Patient stated he no longer had earache, so not to worry about calling in ear drops.  Apologized no one got back to him sooner and if he ever called in and didn't receive call back same day to call again

## 2011-06-17 ENCOUNTER — Ambulatory Visit (INDEPENDENT_AMBULATORY_CARE_PROVIDER_SITE_OTHER): Payer: Medicare Other

## 2011-06-17 DIAGNOSIS — R05 Cough: Secondary | ICD-10-CM

## 2011-06-17 DIAGNOSIS — J811 Chronic pulmonary edema: Secondary | ICD-10-CM

## 2011-06-17 DIAGNOSIS — R937 Abnormal findings on diagnostic imaging of other parts of musculoskeletal system: Secondary | ICD-10-CM

## 2011-06-17 DIAGNOSIS — R059 Cough, unspecified: Secondary | ICD-10-CM

## 2011-09-10 ENCOUNTER — Encounter (HOSPITAL_COMMUNITY): Payer: Self-pay

## 2011-09-10 ENCOUNTER — Emergency Department (HOSPITAL_COMMUNITY)
Admission: EM | Admit: 2011-09-10 | Discharge: 2011-09-10 | Disposition: A | Discharge status: Home / Self Care | Payer: Medicare Other | Attending: Emergency Medicine | Admitting: Emergency Medicine

## 2011-09-10 DIAGNOSIS — R339 Retention of urine, unspecified: Secondary | ICD-10-CM | POA: Insufficient documentation

## 2011-09-10 DIAGNOSIS — I10 Essential (primary) hypertension: Secondary | ICD-10-CM | POA: Insufficient documentation

## 2011-09-10 DIAGNOSIS — E7800 Pure hypercholesterolemia, unspecified: Secondary | ICD-10-CM | POA: Insufficient documentation

## 2011-09-10 DIAGNOSIS — Z9889 Other specified postprocedural states: Secondary | ICD-10-CM | POA: Insufficient documentation

## 2011-09-10 DIAGNOSIS — E785 Hyperlipidemia, unspecified: Secondary | ICD-10-CM | POA: Insufficient documentation

## 2011-09-10 LAB — URINALYSIS, ROUTINE W REFLEX MICROSCOPIC
Glucose, UA: NEGATIVE mg/dL
Leukocytes, UA: NEGATIVE
Protein, ur: NEGATIVE mg/dL
Specific Gravity, Urine: 1.021 (ref 1.005–1.030)
pH: 6.5 (ref 5.0–8.0)

## 2011-09-10 LAB — URINE MICROSCOPIC-ADD ON

## 2011-09-10 NOTE — Discharge Instructions (Signed)
Acute Urinary Retention, Male You have been seen by a caregiver today because of your inability to urinate (pass your water). This is a common problem in elderly males. As men age their prostates become larger and block the flow of urine from the bladder. This is usually a problem that has come on gradually. It is often first noticed by having to get up at night to urinate. This is because as the prostate enlarges it is more difficult to empty the bladder completely. Treatment may involve a one time catheterization to empty the bladder. This is putting in a tube to drain your urine. Then you and your personal caregiver can decide at your earliest convenience how to handle this problem in the future. It may also be a problem that may not recur for years. Sometimes this problem can be caused by medications. In this case, all that is often necessary is to discontinue the offending agent. If you are to leave the foley catheter (a long, narrow, hollow tube) in and go home with a drainage system, you will need to discuss the best course of action with your caregiver. While the catheter is in, maintain a good intake of fluids. Keep the drainage bag emptied and lower than your catheter. This is so contaminated (infected) urine will not be flowing back into your bladder. This could lead to a urinary tract infection. Only take over-the-counter or prescription medicines for pain, discomfort, or fever as directed by your caregiver.  SEEK IMMEDIATE MEDICAL CARE IF:  You develop chills, fever, or show signs of generalized illness that occurs prior to seeing your caregiver. Document Released: 09/11/2000 Document Revised: 05/25/2011 Document Reviewed: 05/27/2008 [facility] Patient Information 2012 [place], [place]. You do not have a urinary tract infection at this time.  Please call Dr. Jake Seats office first thing on Monday morning to be seen, your nurse should have provided you with a leg bag and try to have to switch from  the overnight bedside back to leg bag

## 2011-09-10 NOTE — ED Notes (Signed)
Leg  Bag and instructions provided.

## 2011-09-10 NOTE — ED Provider Notes (Signed)
Medical screening examination/treatment/procedure(s) were performed by non-physician practitioner and as supervising physician I was immediately available for consultation/collaboration.   Elysa Womac, MD 09/10/11 0654 

## 2011-09-10 NOTE — ED Provider Notes (Signed)
History     CSN: [id number]  Arrival date & time 09/10/11  0146   First MD Initiated Contact with Patient 09/10/11 0158      Chief Complaint  Patient presents with  . Urinary Retention    (Consider location/radiation/quality/duration/timing/severity/associated sxs/prior treatment) HPI Comments: Robert Bright is a 78 year old gentleman, who 4 days ago, had a cystoscopy.  It was found that he had several small tumors at the bladder neck.  These were excised and biopsied.  He has been urinating freely.  Since that time until tonight when he had an acute onset of inability to urinate  The history is provided by the patient.    Past Medical History  Diagnosis Date  . Vertigo   . Hypertension     with a component of whitecoat syndrome  . Hyperlipidemia   . Hypercholesterolemia   . Bladder cancer 1993    Being followed by Dr. Laverle Patter  . Prostate cancer 1993    Hx ostatectomyof remote treated with radial pr    Past Surgical History  Procedure Date  . Prostatectomy 1993  . Cardiovascular stress test 01-02-2003    EF 68%  . Inguinal hernia repair     History reviewed. No pertinent family history.  History  Substance Use Topics  . Smoking status: Former Smoker    Quit date: 01/16/1986  . Smokeless tobacco: Not on file  . Alcohol Use: No      Review of Systems  Constitutional: Negative for fever.  Cardiovascular: Negative for leg swelling.  Gastrointestinal: Positive for abdominal distention.  Genitourinary: Positive for decreased urine volume. Negative for hematuria.  Musculoskeletal: Negative for myalgias and back pain.    Allergies  Altace  Home Medications   Current Outpatient Rx  Name Route Sig Dispense Refill  . AMLODIPINE BESYLATE 5 MG PO TABS Oral Take 1 tablet (5 mg total) by mouth daily. 30 tablet 11    Dispense as written.  . ASPIRIN 81 MG PO TABS Oral Take 81 mg by mouth daily.      . ATORVASTATIN CALCIUM 40 MG PO TABS Oral Take 1 tablet (40 mg total) by  mouth daily. 30 tablet 11  . CALCIUM PO Oral Take by mouth.      [name] VITAMIN D PO Oral Take 1,000 mg by mouth daily.      [name] FERROUS SULFATE 325 (65 FE) MG PO TABS Oral Take 325 mg by mouth daily.      [name] HYDROCHLOROTHIAZIDE 25 MG PO TABS Oral Take 1 tablet (25 mg total) by mouth every other day. 30 tablet 11  . LORAZEPAM 0.5 MG PO TABS Oral Take 1 tablet (0.5 mg total) by mouth as needed. 30 tablet 5  . MULTIVITAMIN PO Oral Take by mouth.      . VALSARTAN 80 MG PO TABS Oral Take 1 tablet (80 mg total) by mouth daily. 30 tablet 11    BP 176/100  Pulse 97  Temp(Src) 97.6 F (36.4 C) (Oral)  Resp 18  SpO2 100%  Physical Exam  Constitutional: He is oriented to person, place, and time. He appears well-developed and well-nourished.  HENT:  Head: Normocephalic.  Eyes: Pupils are equal, round, and reactive to light.  Neck: Normal range of motion.  Cardiovascular: Normal rate.   Pulmonary/Chest: Effort normal.  Abdominal: He exhibits distension.  Musculoskeletal: Normal range of motion.  Neurological: He is alert and oriented to person, place, and time.  Skin: Skin is warm and dry.    ED Course  Procedures (including critical care time)   Labs Reviewed  URINALYSIS, ROUTINE W REFLEX MICROSCOPIC   No results found.   No diagnosis found.    MDM  Urinary  retention.  We'll place a Foley teaching patient how to change from overnight back to leg bag and have him follow up with Dr. Laverle Patter, his urologist on Monday Since Foley catheter is in place.  Patient is no longer having any abdominal distention or discomfort        Arman Filter, NP 09/10/11 0354

## 2011-09-21 ENCOUNTER — Telehealth: Payer: Self-pay | Admitting: Cardiology

## 2011-09-21 NOTE — Telephone Encounter (Signed)
New msg Pt said he has been having pain on his left side. He thinks it might be a hernia. Please call

## 2011-09-21 NOTE — Telephone Encounter (Signed)
Per Dr. Patty Sermons, pt advised to follow up with urologist.  Pt agrees.

## 2011-10-26 ENCOUNTER — Ambulatory Visit (INDEPENDENT_AMBULATORY_CARE_PROVIDER_SITE_OTHER): Payer: Medicare Other | Admitting: Cardiology

## 2011-10-26 ENCOUNTER — Encounter: Payer: Self-pay | Admitting: Cardiology

## 2011-10-26 VITALS — BP 134/78 | HR 57 | Ht 70.0 in | Wt 169.0 lb

## 2011-10-26 DIAGNOSIS — Z8551 Personal history of malignant neoplasm of bladder: Secondary | ICD-10-CM

## 2011-10-26 DIAGNOSIS — E7800 Pure hypercholesterolemia, unspecified: Secondary | ICD-10-CM

## 2011-10-26 DIAGNOSIS — R109 Unspecified abdominal pain: Secondary | ICD-10-CM

## 2011-10-26 DIAGNOSIS — I119 Hypertensive heart disease without heart failure: Secondary | ICD-10-CM

## 2011-10-26 LAB — CBC WITH DIFFERENTIAL/PLATELET
Basophils Relative: 0.3 % (ref 0.0–3.0)
Eosinophils Absolute: 0.4 10*3/uL (ref 0.0–0.7)
Lymphocytes Relative: 29.9 % (ref 12.0–46.0)
MCHC: 33.4 g/dL (ref 30.0–36.0)
Neutrophils Relative %: 52.1 % (ref 43.0–77.0)
RBC: 4.74 Mil/uL (ref 4.22–5.81)
WBC: 7.1 10*3/uL (ref 4.5–10.5)

## 2011-10-26 LAB — HEPATIC FUNCTION PANEL
ALT: 24 U/L (ref 0–53)
AST: 26 U/L (ref 0–37)
Bilirubin, Direct: 0.1 mg/dL (ref 0.0–0.3)
Total Bilirubin: 0.7 mg/dL (ref 0.3–1.2)

## 2011-10-26 LAB — LIPID PANEL
LDL Cholesterol: 43 mg/dL (ref 0–99)
Total CHOL/HDL Ratio: 2

## 2011-10-26 LAB — BASIC METABOLIC PANEL
BUN: 31 mg/dL — ABNORMAL HIGH (ref 6–23)
Chloride: 101 mEq/L (ref 96–112)
Potassium: 3.1 mEq/L — ABNORMAL LOW (ref 3.5–5.1)

## 2011-10-26 NOTE — Assessment & Plan Note (Signed)
Patient is on Lipitor for his cholesterol.  He is not having any myalgias from Lipitor.  Blood work today is pending

## 2011-10-26 NOTE — Assessment & Plan Note (Signed)
The patient has not been having any headaches or dizzy spells or symptoms from his blood pressure

## 2011-10-26 NOTE — Progress Notes (Signed)
Robert Bright Date of Birth:  [birth year] [facility] [address] [place], [place]  [address] [phone]         Fax   [phone]  History of Present Illness: This 78 year old gentleman is seen for a followup office visit.  He has a history of essential hypertension and a history of hypercholesterolemia.  He does not have any history of chest pain or ischemic heart disease.  He had a normal nuclear stress test in 2004.  He does have a past history of prostate cancer and a more recent history of bladder cancer.  Dr. Laverle Patter is his urologist.  Patient recently has been experiencing some low suprapubic abdominal discomfort.  Current Outpatient Prescriptions  Medication Sig Dispense Refill  . amLODipine (NORVASC) 5 MG tablet Take 1 tablet (5 mg total) by mouth daily.  30 tablet  11  . aspirin 81 MG tablet Take 81 mg by mouth daily.        [name] atorvastatin (LIPITOR) 40 MG tablet Take 1 tablet (40 mg total) by mouth daily.  30 tablet  11  . CALCIUM PO Take by mouth.        . Cholecalciferol (VITAMIN D PO) Take 1,000 mg by mouth daily.        . ferrous sulfate 325 (65 FE) MG tablet Take 325 mg by mouth daily.        . hydrochlorothiazide (HYDRODIURIL) 25 MG tablet Take 1 tablet (25 mg total) by mouth every other day.  30 tablet  11  . LORazepam (ATIVAN) 0.5 MG tablet Take 1 tablet (0.5 mg total) by mouth as needed.  30 tablet  5  . Multiple Vitamin (MULTIVITAMIN PO) Take by mouth.        . valsartan (DIOVAN) 80 MG tablet Take 1 tablet (80 mg total) by mouth daily.  30 tablet  11  . DISCONTD: metoprolol tartrate (LOPRESSOR) 25 MG tablet Take 25 mg by mouth as needed.          Allergies  Allergen Reactions  . Ramipril     cough    Patient Active Problem List  Diagnoses  . Hypercholesterolemia  . Benign hypertensive heart disease without heart failure  . Hx of bladder cancer  . History of prostate cancer    History  Smoking status  . Former Smoker  .  Quit date: 01/16/1986  Smokeless tobacco  . Not on file    History  Alcohol Use No    No family history on file.  Review of Systems: Constitutional: no fever chills diaphoresis or fatigue or change in weight.  Head and neck: no hearing loss, no epistaxis, no photophobia or visual disturbance. Respiratory: No cough, shortness of breath or wheezing. Cardiovascular: No chest pain peripheral edema, palpitations. Gastrointestinal: No abdominal distention, no abdominal pain, no change in bowel habits hematochezia or melena. Genitourinary: No dysuria, no frequency, no urgency, no nocturia. Musculoskeletal:No arthralgias, no back pain, no gait disturbance or myalgias. Neurological: No dizziness, no headaches, no numbness, no seizures, no syncope, no weakness, no tremors. Hematologic: No lymphadenopathy, no easy bruising. Psychiatric: No confusion, no hallucinations, no sleep disturbance.    Physical Exam: Filed Vitals:   10/26/11 1046  BP: 134/78  Pulse: 57   the general appearance reveals a well-developed well-nourished gentleman in no distress.The head and neck exam reveals pupils equal and reactive.  Extraocular movements are full.  There is no scleral icterus.  The mouth and pharynx are normal.  The neck  is supple.  The carotids reveal no bruits.  The jugular venous pressure is normal.  The  thyroid is not enlarged.  There is no lymphadenopathy.  The chest is clear to percussion and auscultation.  There are no rales or rhonchi.  Expansion of the chest is symmetrical.  The precordium is quiet.  The first heart sound is normal.  The second heart sound is physiologically split.  There is no murmur gallop rub or click.  There is no abnormal lift or heave.  The abdomen is soft and nontender.  The bowel sounds are normal.  The liver and spleen are not enlarged.  There are no abdominal masses.  There are no abdominal bruits.  Extremities reveal good pedal pulses.  There is no phlebitis or edema.   There is no cyanosis or clubbing.  Strength is normal and symmetrical in all extremities.  There is no lateralizing weakness.  There are no sensory deficits.  The skin is warm and dry.  There is no rash.     Assessment / Plan: Continue same medication.  If lower pelvic discomfort persists he will call Dr. Laverle Patter and they may plan to get a CT scan of his pelvis.  We are checking lab work today including a CBC and fasting chemistries and lipid panel

## 2011-10-26 NOTE — Assessment & Plan Note (Signed)
The patient has been experiencing lower abdominal discomfort.  No change in bowel movements.  He did have a recent problem with transient bladder obstruction requiring a Foley catheter.  His last colonoscopy was 4 years ago by Dr. Evette Cristal.  He states that he had some x-rays of his left hip about 6 weeks ago at [facility] orthoped[facility] center.

## 2011-10-26 NOTE — Patient Instructions (Signed)
Your physician recommends that you continue on your current medications as directed. Please refer to the Current Medication list given to you today.  Your physician recommends that you return for lab work in: today    Your physician recommends that you schedule a follow-up appointment in: 4 months with fasting labs

## 2011-10-28 NOTE — Progress Notes (Signed)
Quick Note:  Please report to patient. The recent labs are stable. Continue same medication and careful diet. No anemia. WBC normal. Kidneys drier. Drink more water. Lipids good. Potassium too low. Add Kdur 20 one daily. ______

## 2011-11-01 ENCOUNTER — Telehealth: Payer: Self-pay | Admitting: *Deleted

## 2011-11-01 DIAGNOSIS — E876 Hypokalemia: Secondary | ICD-10-CM

## 2011-11-01 MED ORDER — POTASSIUM CHLORIDE CRYS ER 20 MEQ PO TBCR: 20.0000 meq | EXTENDED_RELEASE_TABLET | Freq: Every day | ORAL | Status: DC

## 2011-11-01 NOTE — Telephone Encounter (Signed)
Advised patient and sent Rx to pharmacy. 

## 2011-11-01 NOTE — Telephone Encounter (Signed)
Message copied by Burnell Blanks on Wed Nov 01, 2011  4:52 PM ------      Message from: Cassell Clement      Created: Sat Oct 28, 2011  5:38 PM       Please report to patient.  The recent labs are stable. Continue same medication and careful diet. No anemia. WBC normal. Kidneys drier.  Drink more water. Lipids good.      Potassium too low.  Add Kdur 20 one daily.

## 2011-11-02 ENCOUNTER — Telehealth: Payer: Self-pay | Admitting: Cardiology

## 2011-11-02 NOTE — Telephone Encounter (Signed)
Please return call to patient at [phone]   Patient has an ear ache and would like a prescription called in.  He can be reached at [phone].

## 2011-11-02 NOTE — Telephone Encounter (Signed)
Pt c/o earache and wants [place] to call in med--per dr brackbill--please have pt go to nearest urgent care--pt agrees--nt

## 2012-02-20 ENCOUNTER — Other Ambulatory Visit: Payer: Medicare Other

## 2012-02-20 ENCOUNTER — Ambulatory Visit: Payer: Medicare Other | Admitting: Cardiology

## 2012-03-19 ENCOUNTER — Other Ambulatory Visit: Payer: Medicare Other

## 2012-03-19 ENCOUNTER — Ambulatory Visit: Payer: Medicare Other | Admitting: Cardiology

## 2012-05-02 ENCOUNTER — Other Ambulatory Visit (INDEPENDENT_AMBULATORY_CARE_PROVIDER_SITE_OTHER): Payer: Medicare Other

## 2012-05-02 ENCOUNTER — Encounter: Payer: Self-pay | Admitting: Cardiology

## 2012-05-02 ENCOUNTER — Ambulatory Visit (INDEPENDENT_AMBULATORY_CARE_PROVIDER_SITE_OTHER): Payer: Medicare Other | Admitting: Cardiology

## 2012-05-02 VITALS — BP 128/70 | HR 74 | Resp 18 | Ht 70.0 in | Wt 169.0 lb

## 2012-05-02 DIAGNOSIS — I119 Hypertensive heart disease without heart failure: Secondary | ICD-10-CM

## 2012-05-02 DIAGNOSIS — E7800 Pure hypercholesterolemia, unspecified: Secondary | ICD-10-CM

## 2012-05-02 DIAGNOSIS — R109 Unspecified abdominal pain: Secondary | ICD-10-CM

## 2012-05-02 DIAGNOSIS — M5430 Sciatica, unspecified side: Secondary | ICD-10-CM | POA: Insufficient documentation

## 2012-05-02 LAB — HEPATIC FUNCTION PANEL
ALT: 19 U/L (ref 0–53)
AST: 24 U/L (ref 0–37)
Albumin: 4.2 g/dL (ref 3.5–5.2)
Alkaline Phosphatase: 48 U/L (ref 39–117)
Bilirubin, Direct: 0.1 mg/dL (ref 0.0–0.3)
Total Bilirubin: 0.7 mg/dL (ref 0.3–1.2)
Total Protein: 7.5 g/dL (ref 6.0–8.3)

## 2012-05-02 LAB — BASIC METABOLIC PANEL
BUN: 27 mg/dL — ABNORMAL HIGH (ref 6–23)
CO2: 24 mEq/L (ref 19–32)
Calcium: 9.3 mg/dL (ref 8.4–10.5)
Chloride: 105 mEq/L (ref 96–112)
Creatinine, Ser: 1.5 mg/dL (ref 0.4–1.5)
GFR: 48.66 mL/min — ABNORMAL LOW (ref >60.00)
Glucose, Bld: 90 mg/dL (ref 70–99)
Potassium: 4.1 mEq/L (ref 3.5–5.1)
Sodium: 137 mEq/L (ref 135–145)

## 2012-05-02 LAB — LIPID PANEL
Cholesterol: 182 mg/dL (ref 0–200)
LDL Cholesterol: 100 mg/dL — ABNORMAL HIGH (ref 0–99)
Triglycerides: 120 mg/dL (ref 0.0–149.0)
VLDL: 24 mg/dL (ref 0.0–40.0)

## 2012-05-02 NOTE — Progress Notes (Signed)
Quick Note:  Please report to patient. The recent labs are stable. Continue same medication and careful diet. ______ 

## 2012-05-02 NOTE — Assessment & Plan Note (Signed)
The patient has a history of hypercholesterolemia.  He is on Lipitor 40 mg daily.  Is not having any myalgias.

## 2012-05-02 NOTE — Assessment & Plan Note (Signed)
The patient has been having some discomfort in his left thigh radiating down to the inner aspect of the left calf.  The pain is worse when he first gets up after sitting a long time.  Walking actually makes it better.  He noted that his orthopedist in the past had x-rayed his left hip and that x-ray apparently was unremarkable.  He is requesting a followup appointment to see Dr. Simonne Come in January and we will request that.

## 2012-05-02 NOTE — Patient Instructions (Addendum)
Your physician recommends that you continue on your current medications as directed. Please refer to the Current Medication list given to you today.   Will obtain labs today and call you with the results (lp/bmet/hfp)  Appointment with Dr Simonne Come June 24, 2012 at 10:00 am, will receive a call 24 to 48 hours prior for a reminder.  If you need to reschedule the pone number is [phone]  Your physician wants you to follow-up in: 6 months You will receive a reminder letter in the mail two months in advance. If you don't receive a letter, please call our office to schedule the follow-up appointment.

## 2012-05-02 NOTE — Assessment & Plan Note (Signed)
The patient has not been experiencing any exertional chest pain. No symptoms of CHF. No dizziness or syncope. 

## 2012-05-02 NOTE — Progress Notes (Signed)
Robert Bright Date of Birth:  July 03, 1930 [facility] [address] [place], [place]  [address] [phone]         Fax   [phone]  History of Present Illness: This 78 year old gentleman is seen for a followup office visit. He has a history of essential hypertension and a history of hypercholesterolemia. He does not have any history of chest pain or ischemic heart disease. He had a normal nuclear stress test in 2004. He does have a past history of prostate cancer and a more recent history of bladder cancer. Dr. Laverle Patter is his urologist.  Recently the patient has been experiencing some sciatica pain involving his left leg with radiation down to the left calf.   Current Outpatient Prescriptions  Medication Sig Dispense Refill  . amLODipine (NORVASC) 5 MG tablet Take 1 tablet (5 mg total) by mouth daily.  30 tablet  11  . aspirin 81 MG tablet Take 81 mg by mouth daily.        [name] atorvastatin (LIPITOR) 40 MG tablet Take 1 tablet (40 mg total) by mouth daily.  30 tablet  11  . CALCIUM PO Take by mouth.        . Cholecalciferol (VITAMIN D PO) Take 1,000 mg by mouth daily.        . ferrous sulfate 325 (65 FE) MG tablet Take 325 mg by mouth daily.        . hydrochlorothiazide (HYDRODIURIL) 25 MG tablet Take 1 tablet (25 mg total) by mouth every other day.  30 tablet  11  . LORazepam (ATIVAN) 0.5 MG tablet Take 1 tablet (0.5 mg total) by mouth as needed.  30 tablet  5  . Multiple Vitamin (MULTIVITAMIN PO) Take by mouth.        . potassium chloride SA (K-DUR,KLOR-CON) 20 MEQ tablet Take 1 tablet (20 mEq total) by mouth daily.  30 tablet  5  . valsartan (DIOVAN) 80 MG tablet Take 1 tablet (80 mg total) by mouth daily.  30 tablet  11  . [DISCONTINUED] metoprolol tartrate (LOPRESSOR) 25 MG tablet Take 25 mg by mouth as needed.          Allergies  Allergen Reactions  . Ramipril     cough    Patient Active Problem List  Diagnosis  . Hypercholesterolemia  . Benign  hypertensive heart disease without heart failure  . Hx of bladder cancer  . History of prostate cancer  . Sciatica neuralgia    History  Smoking status  . Former Smoker  . Quit date: 01/16/1986  Smokeless tobacco  . Not on file    History  Alcohol Use No    No family history on file.  Review of Systems: Constitutional: no fever chills diaphoresis or fatigue or change in weight.  Head and neck: no hearing loss, no epistaxis, no photophobia or visual disturbance. Respiratory: No cough, shortness of breath or wheezing. Cardiovascular: No chest pain peripheral edema, palpitations. Gastrointestinal: No abdominal distention, no abdominal pain, no change in bowel habits hematochezia or melena. Genitourinary: No dysuria, no frequency, no urgency, no nocturia. Musculoskeletal:No arthralgias, no back pain, no gait disturbance or myalgias. Neurological: No dizziness, no headaches, no numbness, no seizures, no syncope, no weakness, no tremors. Hematologic: No lymphadenopathy, no easy bruising. Psychiatric: No confusion, no hallucinations, no sleep disturbance.    Physical Exam: Filed Vitals:   05/02/12 1209  BP: 128/70  Pulse: 74  Resp: 18   the general appearance reveals a  well-developed well-nourished elderly gentleman in no distress.The head and neck exam reveals pupils equal and reactive.  Extraocular movements are full.  There is no scleral icterus.  The mouth and pharynx are normal.  The neck is supple.  The carotids reveal no bruits.  The jugular venous pressure is normal.  The  thyroid is not enlarged.  There is no lymphadenopathy.  The chest is clear to percussion and auscultation.  There are no rales or rhonchi.  Expansion of the chest is symmetrical.  The precordium is quiet.  The first heart sound is normal.  The second heart sound is physiologically split.  There is no murmur gallop rub or click.  There is no abnormal lift or heave.  The abdomen is soft and nontender.  The  bowel sounds are normal.  The liver and spleen are not enlarged.  There are no abdominal masses.  There are no abdominal bruits.  Extremities reveal good pedal pulses.  There is no phlebitis or edema.  There is no cyanosis or clubbing.  Strength is normal and symmetrical in all extremities.  There is no lateralizing weakness.  There are no sensory deficits.  The skin is warm and dry.  There is no rash.     Assessment / Plan: There is no evidence of any peripheral arterial insufficiency.  He has good pedal pulses.  We will refer him to see Dr. Leslee Home in January at his request. He is not due for a colonoscopy yet but will be due for one in another year and he would like to see Dr. Leone Payor who sees his wife. From the cardiac standpoint we will continue same medication.  Lab work today is pending.  Recheck in 6 months for office visit and lab work

## 2012-05-03 ENCOUNTER — Telehealth: Payer: Self-pay | Admitting: *Deleted

## 2012-05-03 NOTE — Telephone Encounter (Signed)
Message copied by Burnell Blanks on Fri May 03, 2012 12:33 PM ------      Message from: Cassell Clement      Created: Thu May 02, 2012  8:46 PM       Please report to patient.  The recent labs are stable. Continue same medication and careful diet.

## 2012-05-03 NOTE — Telephone Encounter (Signed)
Advised patient of lab results and mailed copy 

## 2012-05-17 ENCOUNTER — Other Ambulatory Visit: Payer: Self-pay | Admitting: *Deleted

## 2012-05-17 ENCOUNTER — Telehealth: Payer: Self-pay | Admitting: Cardiology

## 2012-05-17 DIAGNOSIS — E876 Hypokalemia: Secondary | ICD-10-CM

## 2012-05-17 MED ORDER — VALSARTAN 80 MG PO TABS: 80.0000 mg | ORAL_TABLET | Freq: Every day | ORAL | Status: DC

## 2012-05-17 MED ORDER — ATORVASTATIN CALCIUM 40 MG PO TABS: 40.0000 mg | ORAL_TABLET | Freq: Every day | ORAL | Status: DC

## 2012-05-17 MED ORDER — POTASSIUM CHLORIDE CRYS ER 20 MEQ PO TBCR: 20.0000 meq | EXTENDED_RELEASE_TABLET | Freq: Every day | ORAL | Status: DC

## 2012-05-17 MED ORDER — HYDROCHLOROTHIAZIDE 25 MG PO TABS: 25.0000 mg | ORAL_TABLET | ORAL | Status: DC

## 2012-05-17 MED ORDER — AMLODIPINE BESYLATE 5 MG PO TABS: 5.0000 mg | ORAL_TABLET | Freq: Every day | ORAL | Status: DC

## 2012-05-17 NOTE — Telephone Encounter (Signed)
New Problem:    Patient called in needing a refill of all of his medications sent in to [facility] on [address] phone- [phone].  Please call back if you have any questions.

## 2012-07-17 ENCOUNTER — Telehealth: Payer: Self-pay | Admitting: Cardiology

## 2012-07-17 NOTE — Telephone Encounter (Signed)
New PRoblem:    Patient called in needing a refill of the patient's LORazepam (ATIVAN) 0.5 MG tablet.

## 2012-07-18 ENCOUNTER — Other Ambulatory Visit: Payer: Self-pay | Admitting: *Deleted

## 2012-07-18 DIAGNOSIS — F419 Anxiety disorder, unspecified: Secondary | ICD-10-CM

## 2012-07-18 MED ORDER — LORAZEPAM 0.5 MG PO TABS: 0.5000 mg | ORAL_TABLET | ORAL | Status: DC | PRN

## 2012-07-19 NOTE — Telephone Encounter (Signed)
Advised patient Rx called in yesterday. Advised to call back if any problems

## 2012-08-07 ENCOUNTER — Telehealth: Payer: Self-pay | Admitting: Cardiology

## 2012-08-07 DIAGNOSIS — Z139 Encounter for screening, unspecified: Secondary | ICD-10-CM

## 2012-08-07 DIAGNOSIS — K5900 Constipation, unspecified: Secondary | ICD-10-CM

## 2012-08-07 DIAGNOSIS — R109 Unspecified abdominal pain: Secondary | ICD-10-CM

## 2012-08-07 DIAGNOSIS — E876 Hypokalemia: Secondary | ICD-10-CM

## 2012-08-07 NOTE — Telephone Encounter (Signed)
New problem    Referral to Dr. Leone Payor because his wife sees him .

## 2012-08-07 NOTE — Telephone Encounter (Signed)
Needs colonoscopy and would like to see him. Advised would call in am when they are open

## 2012-08-08 NOTE — Telephone Encounter (Signed)
March 18 at 3:00  Patient has has some stomach discomfort and constipation. Will call again tomorrow

## 2012-08-09 ENCOUNTER — Encounter: Payer: Self-pay | Admitting: Internal Medicine

## 2012-08-09 MED ORDER — POTASSIUM CHLORIDE CRYS ER 20 MEQ PO TBCR: 20.0000 meq | EXTENDED_RELEASE_TABLET | Freq: Every day | ORAL | Status: DC

## 2012-08-09 MED ORDER — HYDROCHLOROTHIAZIDE 25 MG PO TABS: 25.0000 mg | ORAL_TABLET | ORAL | Status: DC

## 2012-08-09 MED ORDER — ATORVASTATIN CALCIUM 40 MG PO TABS: 40.0000 mg | ORAL_TABLET | Freq: Every day | ORAL | Status: DC

## 2012-08-09 NOTE — Telephone Encounter (Signed)
Advised patient of appointment

## 2012-08-09 NOTE — Telephone Encounter (Signed)
March 20 at 1:45 appointment

## 2012-08-17 ENCOUNTER — Ambulatory Visit (INDEPENDENT_AMBULATORY_CARE_PROVIDER_SITE_OTHER): Payer: Medicare Other | Admitting: Internal Medicine

## 2012-08-17 VITALS — BP 138/70 | HR 61 | Temp 98.1°F | Resp 18 | Ht 70.0 in | Wt 176.9 lb

## 2012-08-17 DIAGNOSIS — B029 Zoster without complications: Secondary | ICD-10-CM

## 2012-08-17 DIAGNOSIS — H5710 Ocular pain, unspecified eye: Secondary | ICD-10-CM

## 2012-08-17 DIAGNOSIS — R519 Headache, unspecified: Secondary | ICD-10-CM

## 2012-08-17 DIAGNOSIS — R51 Headache: Secondary | ICD-10-CM

## 2012-08-17 DIAGNOSIS — H10029 Other mucopurulent conjunctivitis, unspecified eye: Secondary | ICD-10-CM

## 2012-08-17 DIAGNOSIS — H10022 Other mucopurulent conjunctivitis, left eye: Secondary | ICD-10-CM

## 2012-08-17 DIAGNOSIS — H5712 Ocular pain, left eye: Secondary | ICD-10-CM

## 2012-08-17 MED ORDER — HYDROCODONE-ACETAMINOPHEN 5-325 MG PO TABS: 1.0000 | ORAL_TABLET | Freq: Four times a day (QID) | ORAL | Status: DC | PRN

## 2012-08-17 MED ORDER — MOXIFLOXACIN HCL 0.5 % OP SOLN: OPHTHALMIC | Status: DC

## 2012-08-17 MED ORDER — VALACYCLOVIR HCL 1 G PO TABS: 1000.0000 mg | ORAL_TABLET | Freq: Three times a day (TID) | ORAL | Status: DC

## 2012-08-17 MED ORDER — GANCICLOVIR 0.15 % OP GEL: OPHTHALMIC | Status: DC

## 2012-08-17 NOTE — Progress Notes (Signed)
  Subjective:    Patient ID: Robert Bright, male    DOB: [birth year], 79 y.o.   MRN: [id number]  HPI 3-4d ago pain started leftt side of face, orbit and scalp. Has gotten worse and now lids swollen with rash on scalp and face He called his eye doc on Wed appt made for next week. He had shingles vaccine 2007. Lesions are painful.   Review of Systems Dr. Marrion Coy    Objective:   Physical Exam  Vitals reviewed. Constitutional: He appears well-developed and well-nourished.  Eyes: EOM are normal. Pupils are equal, round, and reactive to light. Right eye exhibits no discharge. Left eye exhibits chemosis, discharge and exudate. Left eye exhibits no hordeolum. No foreign body present in the left eye. Left conjunctiva is injected. No scleral icterus. Left eye exhibits normal extraocular motion and no nystagmus.    Linear corneal uptake inferiorly, Purulent discharge   Opthaine,fluricein, woods lamp, ocuflox   Left face, orbit, lids red swollen moderately, some eye discharge       Assessment & Plan:  Shingles left face and orbit Corneal uptake appears to be abrasion Purulent conjunctivitis Pain face Valtrex 1g tid/Contact his opthamology group/vigamox q 4hrs Close f/up [facility] eye doc [facility] returned call and Dr. Jettie Pagan prescribed Zirgan drops 5x day and to see him Monday am

## 2012-08-17 NOTE — Patient Instructions (Signed)

## 2012-08-21 ENCOUNTER — Telehealth: Payer: Self-pay | Admitting: Cardiology

## 2012-08-21 NOTE — Telephone Encounter (Signed)
Since pt is not currently taking a beta blocker he is ok to use the prescribed eye drops.

## 2012-08-21 NOTE — Telephone Encounter (Signed)
New Problem:    Patient called in because he has shingles that affected his eye pressure and had to go to his optimologist.  Patient was prescribed eye drops (combigan solution .2%) that warn that he needs to be careful if he is taking beta blockers.  Please call back.

## 2012-08-25 ENCOUNTER — Telehealth: Payer: Self-pay

## 2012-08-25 NOTE — Telephone Encounter (Signed)
Pt states he saw dr guest recently for shingles and received an rx. It is helping a lot but he is almost out and still has some areas that arent totally gone. States he is healing and very happy with the rx. States he has one refill left and wants to know if he should request to fill it or rtc for a recheck?  Best: [phone]  bf

## 2012-08-26 NOTE — Telephone Encounter (Signed)
There is no benefit to continuing the Valtrex, is he asking for renewal on the Hydrocodone, fine to take this prn.

## 2012-08-26 NOTE — Telephone Encounter (Signed)
Pt Robert Bright and Angela p/up pt's call. She advised pt that 7 days of the Valtrex should have been enough. Also advised pt that he should continue the drops ophthalmologist Rxd until he returns for his f/up with the ophthalmologist and they can decide whether it is necessary to continue beyond that time. Pt agreed.

## 2012-09-05 ENCOUNTER — Ambulatory Visit: Payer: Medicare Other | Admitting: Internal Medicine

## 2013-03-20 ENCOUNTER — Ambulatory Visit (INDEPENDENT_AMBULATORY_CARE_PROVIDER_SITE_OTHER): Payer: Medicare Other | Admitting: Internal Medicine

## 2013-03-20 ENCOUNTER — Encounter: Payer: Self-pay | Admitting: Internal Medicine

## 2013-03-20 VITALS — BP 140/80 | HR 60 | Ht 70.0 in | Wt 172.1 lb

## 2013-03-20 DIAGNOSIS — K219 Gastro-esophageal reflux disease without esophagitis: Secondary | ICD-10-CM

## 2013-03-20 MED ORDER — LANSOPRAZOLE 15 MG PO CPDR: 15.0000 mg | DELAYED_RELEASE_CAPSULE | Freq: Every day | ORAL | Status: DC

## 2013-03-20 NOTE — Patient Instructions (Addendum)
We have sent the following medications to your pharmacy for you to pick up at your convenience: Lansoprazole  Please follow up as needed.  Call our office in around 2 months for an appointment for your wife.  I appreciate the opportunity to care for you.

## 2013-03-20 NOTE — Progress Notes (Signed)
Subjective:    Patient ID: Robert Bright, male    DOB: 10/04/1930, 79 y.o.   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 He has noted that if he bends over at times she will have hot fluid regurgitate into his esophagus which causes him burning chest pain. Otherwise he does not have dysphagia, unintentional weight loss or bleeding or anemia them aware of. He does have occasional constipation and this is relieved with milk of magnesia. Allergies  Allergen Reactions  . Ramipril     cough   Outpatient Prescriptions Prior to Visit  Medication Sig Dispense Refill  . amLODipine (NORVASC) 5 MG tablet Take 1 tablet (5 mg total) by mouth daily.  30 tablet  11  . aspirin 81 MG tablet Take 81 mg by mouth daily.        [name] atorvastatin (LIPITOR) 40 MG tablet Take 1 tablet (40 mg total) by mouth daily.  30 tablet  11  . CALCIUM PO Take by mouth.        . Cholecalciferol (VITAMIN D PO) Take 1,000 mg by mouth daily.        . ferrous sulfate 325 (65 FE) MG tablet Take 325 mg by mouth daily.        . hydrochlorothiazide (HYDRODIURIL) 25 MG tablet Take 1 tablet (25 mg total) by mouth every other day.  30 tablet  11  . LORazepam (ATIVAN) 0.5 MG tablet Take 1 tablet (0.5 mg total) by mouth as needed.  30 tablet  5  . moxifloxacin (VIGAMOX) 0.5 % ophthalmic solution One-two drops left eye q 2hrs till better  3 mL  1  . Multiple Vitamin (MULTIVITAMIN PO) Take by mouth.        . potassium chloride SA (K-DUR,KLOR-CON) 20 MEQ tablet Take 1 tablet (20 mEq total) by mouth daily.  30 tablet  11  . valsartan (DIOVAN) 80 MG tablet Take 1 tablet (80 mg total) by mouth daily.  30 tablet  11  . Ganciclovir (ZIRGAN) 0.15 % GEL One drop q 4hrs 5 doses per day  5 Tube  1  . HYDROcodone-acetaminophen (NORCO/VICODIN) 5-325 MG per tablet Take 1 tablet by mouth every 6 (six) hours as needed for pain.  30 tablet  0  . valACYclovir  (VALTREX) 1000 MG tablet Take 1 tablet (1,000 mg total) by mouth 3 (three) times daily.  30 tablet  1   No facility-administered medications prior to visit.   Past Medical History  Diagnosis Date  . Vertigo   . Hypertension     with a component of whitecoat syndrome  . Hyperlipidemia   . Hypercholesterolemia   . Bladder cancer 1993    Being followed by Dr. Laverle Patter  . Prostate cancer 1993    Hx ostatectomyof remote treated with radial pr  . Shingles    Past Surgical History  Procedure Laterality Date  . Prostatectomy  1993  . Cardiovascular stress test  01-02-2003    EF 68%  . Inguinal hernia repair    . Colonoscopy     History   Social History  . Marital Status: Married    Spouse Name: N/A    Number of Children: N/A  . Years of Education: N/A   Social History Main Topics  . Smoking status: Former Smoker    Quit date: 01/16/1986  . Smokeless tobacco: Never Used  . Alcohol Use: No  . Drug  Use: No  . Sexual Activity: None   Other Topics Concern  . 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 GERD (gastroesophageal reflux disease)    I will start lansoprazole 15 mg daily. This will help his reflux but he is on a baby aspirin daily need 87 years old who is at increased risk of ulcer disease so I think that's a reasonable prophylactic agent. This is the main reason for starting it. He'll return to me as needed.

## 2013-04-08 ENCOUNTER — Encounter: Payer: Self-pay | Admitting: Cardiology

## 2013-04-08 ENCOUNTER — Ambulatory Visit (INDEPENDENT_AMBULATORY_CARE_PROVIDER_SITE_OTHER): Payer: Medicare Other | Admitting: Cardiology

## 2013-04-08 VITALS — BP 143/74 | HR 50 | Wt 170.0 lb

## 2013-04-08 DIAGNOSIS — F419 Anxiety disorder, unspecified: Secondary | ICD-10-CM

## 2013-04-08 DIAGNOSIS — F411 Generalized anxiety disorder: Secondary | ICD-10-CM

## 2013-04-08 DIAGNOSIS — E785 Hyperlipidemia, unspecified: Secondary | ICD-10-CM

## 2013-04-08 DIAGNOSIS — Z8551 Personal history of malignant neoplasm of bladder: Secondary | ICD-10-CM

## 2013-04-08 DIAGNOSIS — E7800 Pure hypercholesterolemia, unspecified: Secondary | ICD-10-CM

## 2013-04-08 DIAGNOSIS — E876 Hypokalemia: Secondary | ICD-10-CM

## 2013-04-08 DIAGNOSIS — I119 Hypertensive heart disease without heart failure: Secondary | ICD-10-CM

## 2013-04-08 LAB — LIPID PANEL
HDL: 58.4 mg/dL (ref >39.00)
Triglycerides: 107 mg/dL (ref 0.0–149.0)

## 2013-04-08 LAB — BASIC METABOLIC PANEL
Calcium: 9.7 mg/dL (ref 8.4–10.5)
Creatinine, Ser: 1.5 mg/dL (ref 0.4–1.5)
GFR: 46.71 mL/min — ABNORMAL LOW (ref >60.00)
Glucose, Bld: 87 mg/dL (ref 70–99)
Sodium: 137 mEq/L (ref 135–145)

## 2013-04-08 LAB — HEPATIC FUNCTION PANEL
ALT: 25 U/L (ref 0–53)
AST: 30 U/L (ref 0–37)
Albumin: 4.1 g/dL (ref 3.5–5.2)
Alkaline Phosphatase: 45 U/L (ref 39–117)
Total Bilirubin: 0.9 mg/dL (ref 0.3–1.2)

## 2013-04-08 MED ORDER — POTASSIUM CHLORIDE CRYS ER 20 MEQ PO TBCR: 20.0000 meq | EXTENDED_RELEASE_TABLET | Freq: Every day | ORAL | Status: DC

## 2013-04-08 MED ORDER — VALSARTAN 160 MG PO TABS: 160.0000 mg | ORAL_TABLET | Freq: Every day | ORAL | Status: DC

## 2013-04-08 MED ORDER — HYDROCHLOROTHIAZIDE 25 MG PO TABS: 25.0000 mg | ORAL_TABLET | ORAL | Status: DC

## 2013-04-08 MED ORDER — AMLODIPINE BESYLATE 5 MG PO TABS: 5.0000 mg | ORAL_TABLET | Freq: Every day | ORAL | Status: DC

## 2013-04-08 MED ORDER — ATORVASTATIN CALCIUM 40 MG PO TABS
40.0000 mg | ORAL_TABLET | Freq: Every day | ORAL | Status: DC
Start: 2013-04-08 — End: 2013-07-10

## 2013-04-08 MED ORDER — LORAZEPAM 0.5 MG PO TABS: 0.5000 mg | ORAL_TABLET | ORAL | Status: DC | PRN

## 2013-04-08 NOTE — Assessment & Plan Note (Signed)
Patient has a history of bladder cancer.  His most recent cystoscopy showed no new tumors.

## 2013-04-08 NOTE — Progress Notes (Signed)
Quick Note:  Please report to patient. The recent labs are stable. Continue same medication and careful diet. ______ 

## 2013-04-08 NOTE — Progress Notes (Signed)
Robert Bright Date of Birth:  [birth year] [address] [place], [place]  [address] [phone]         Fax   [phone]  History of Present Illness: This 79 year old gentleman is seen for a followup office visit. He has a history of essential hypertension and a history of hypercholesterolemia. He does not have any history of chest pain or ischemic heart disease. He had a normal nuclear stress test in 2004. He does have a past history of prostate cancer and a more recent history of bladder cancer. Dr. Laverle Patter is his urologist.    Current Outpatient Prescriptions  Medication Sig Dispense Refill  . amLODipine (NORVASC) 5 MG tablet Take 1 tablet (5 mg total) by mouth daily.  30 tablet  11  . aspirin 81 MG tablet Take 81 mg by mouth daily.        [name] atorvastatin (LIPITOR) 40 MG tablet Take 1 tablet (40 mg total) by mouth daily.  30 tablet  11  . ferrous sulfate 325 (65 FE) MG tablet Take 325 mg by mouth daily.        . hydrochlorothiazide (HYDRODIURIL) 25 MG tablet Take 1 tablet (25 mg total) by mouth every other day.  30 tablet  11  . LORazepam (ATIVAN) 0.5 MG tablet Take 1 tablet (0.5 mg total) by mouth as needed.  30 tablet  5  . moxifloxacin (VIGAMOX) 0.5 % ophthalmic solution One-two drops left eye q 2hrs till better  3 mL  1  . Multiple Vitamin (MULTIVITAMIN PO) Take by mouth.        . potassium chloride SA (K-DUR,KLOR-CON) 20 MEQ tablet Take 1 tablet (20 mEq total) by mouth daily.  30 tablet  11  . valsartan (DIOVAN) 160 MG tablet Take 1 tablet (160 mg total) by mouth daily.  30 tablet  11  . [DISCONTINUED] metoprolol tartrate (LOPRESSOR) 25 MG tablet Take 25 mg by mouth as needed.         No current facility-administered medications for this visit.    Allergies  Allergen Reactions  . Ramipril     cough    Patient Active Problem List   Diagnosis Date Noted  . Sciatica neuralgia 05/02/2012  . Hypercholesterolemia 01/18/2011  . Benign hypertensive  heart disease without heart failure 01/18/2011  . Hx of bladder cancer 01/18/2011  . History of prostate cancer 01/18/2011    History  Smoking status  . Former Smoker  . Quit date: 01/16/1986  Smokeless tobacco  . Never Used    History  Alcohol Use No    No family history on file.  Review of Systems: Constitutional: no fever chills diaphoresis or fatigue or change in weight.  Head and neck: no hearing loss, no epistaxis, no photophobia or visual disturbance. Respiratory: No cough, shortness of breath or wheezing. Cardiovascular: No chest pain peripheral edema, palpitations. Gastrointestinal: No abdominal distention, no abdominal pain, no change in bowel habits hematochezia or melena. Genitourinary: No dysuria, no frequency, no urgency, no nocturia. Musculoskeletal:No arthralgias, no back pain, no gait disturbance or myalgias. Neurological: No dizziness, no headaches, no numbness, no seizures, no syncope, no weakness, no tremors. Hematologic: No lymphadenopathy, no easy bruising. Psychiatric: No confusion, no hallucinations, no sleep disturbance.    Physical Exam: Filed Vitals:   04/08/13 1353  BP: 143/74  Pulse: 50   the general appearance reveals a well-developed well-nourished elderly gentleman in no distress.The head and neck exam reveals pupils equal and reactive.  Extraocular  movements are full.  There is no scleral icterus.  The mouth and pharynx are normal.  The neck is supple.  The carotids reveal no bruits.  The jugular venous pressure is normal.  The  thyroid is not enlarged.  There is no lymphadenopathy.  The chest is clear to percussion and auscultation.  There are no rales or rhonchi.  Expansion of the chest is symmetrical.  The precordium is quiet.  The first heart sound is normal.  The second heart sound is physiologically split.  There is no murmur gallop rub or click.  There is no abnormal lift or heave.  The abdomen is soft and nontender.  The bowel sounds are  normal.  The liver and spleen are not enlarged.  There are no abdominal masses.  There are no abdominal bruits.  Extremities reveal good pedal pulses.  There is no phlebitis or edema.  There is no cyanosis or clubbing.  Strength is normal and symmetrical in all extremities.  There is no lateralizing weakness.  There are no sensory deficits.  The skin is warm and dry.  There is no rash.     Assessment / Plan: Continue on same medication except increase Diovan up 260 mg daily.  Recheck in 6 months for followup office visit lipid panel hepatic function panel and basal metabolic panel

## 2013-04-08 NOTE — Assessment & Plan Note (Signed)
His blood pressure has been running slightly high.  We will increase his Diovan up to 160 mg daily.

## 2013-04-08 NOTE — Assessment & Plan Note (Signed)
Patient has a history of hypercholesterolemia.  He has been taking Lipitor.  Is not having any myalgias.  We are checking lab work today results pending

## 2013-04-08 NOTE — Patient Instructions (Signed)
Will obtain labs today and call you with the results (lp/bmet/hfp)  INCREASE YOUR DIOVAN TO 160 MG DAILY  Your physician wants you to follow-up in: 6 months with fasting labs (lp/bmet/hfp) You will receive a reminder letter in the mail two months in advance. If you don't receive a letter, please call our office to schedule the follow-up appointment.

## 2013-04-09 ENCOUNTER — Telehealth: Payer: Self-pay | Admitting: *Deleted

## 2013-04-09 NOTE — Telephone Encounter (Signed)
Mailed copy of labs and left message to call if any questions  

## 2013-04-09 NOTE — Telephone Encounter (Signed)
Message copied by Burnell Blanks on Wed Apr 09, 2013 11:27 AM ------      Message from: Cassell Clement      Created: Tue Apr 08, 2013  8:34 PM       Please report to patient.  The recent labs are stable. Continue same medication and careful diet. ------

## 2013-04-23 ENCOUNTER — Telehealth: Payer: Self-pay | Admitting: *Deleted

## 2013-04-23 NOTE — Telephone Encounter (Signed)
Patient returned call and would like to continue NB Lipitor, gets it free

## 2013-04-23 NOTE — Telephone Encounter (Signed)
Received a fax from [facility] requesting generic substitution for generic Lipitor. Reviewed by  Dr. Patty Sermons and ok to change if the patients would like to change. Left message to call back

## 2013-05-05 ENCOUNTER — Ambulatory Visit: Payer: Medicare Other | Admitting: Cardiology

## 2013-07-10 ENCOUNTER — Other Ambulatory Visit: Payer: Self-pay | Admitting: *Deleted

## 2013-07-10 MED ORDER — ATORVASTATIN CALCIUM 40 MG PO TABS: 40.0000 mg | ORAL_TABLET | Freq: Every day | ORAL | Status: DC

## 2013-10-14 ENCOUNTER — Encounter: Payer: Self-pay | Admitting: Cardiology

## 2013-10-14 ENCOUNTER — Other Ambulatory Visit: Payer: Medicare Other

## 2013-10-14 ENCOUNTER — Ambulatory Visit (INDEPENDENT_AMBULATORY_CARE_PROVIDER_SITE_OTHER): Payer: Medicare Other | Admitting: Cardiology

## 2013-10-14 VITALS — BP 130/68 | HR 57 | Ht 70.0 in | Wt 169.0 lb

## 2013-10-14 DIAGNOSIS — I4510 Unspecified right bundle-branch block: Secondary | ICD-10-CM

## 2013-10-14 DIAGNOSIS — E7800 Pure hypercholesterolemia, unspecified: Secondary | ICD-10-CM

## 2013-10-14 DIAGNOSIS — I119 Hypertensive heart disease without heart failure: Secondary | ICD-10-CM

## 2013-10-14 DIAGNOSIS — E785 Hyperlipidemia, unspecified: Secondary | ICD-10-CM

## 2013-10-14 LAB — BASIC METABOLIC PANEL
BUN: 24 mg/dL — AB (ref 6–23)
CALCIUM: 9.4 mg/dL (ref 8.4–10.5)
CO2: 24 mEq/L (ref 19–32)
Chloride: 105 mEq/L (ref 96–112)
Creatinine, Ser: 1.6 mg/dL — ABNORMAL HIGH (ref 0.4–1.5)
GFR: 45.62 mL/min — AB (ref >60.00)
GLUCOSE: 77 mg/dL (ref 70–99)
POTASSIUM: 3.6 meq/L (ref 3.5–5.1)
Sodium: 138 mEq/L (ref 135–145)

## 2013-10-14 LAB — HEPATIC FUNCTION PANEL
ALT: 20 U/L (ref 0–53)
AST: 22 U/L (ref 0–37)
Albumin: 4.1 g/dL (ref 3.5–5.2)
Alkaline Phosphatase: 54 U/L (ref 39–117)
BILIRUBIN TOTAL: 0.7 mg/dL (ref 0.3–1.2)
Bilirubin, Direct: 0.1 mg/dL (ref 0.0–0.3)
TOTAL PROTEIN: 7.3 g/dL (ref 6.0–8.3)

## 2013-10-14 LAB — LIPID PANEL
CHOLESTEROL: 134 mg/dL (ref 0–200)
HDL: 54.2 mg/dL (ref >39.00)
LDL Cholesterol: 61 mg/dL (ref 0–99)
Total CHOL/HDL Ratio: 2
Triglycerides: 93 mg/dL (ref 0.0–149.0)
VLDL: 18.6 mg/dL (ref 0.0–40.0)

## 2013-10-14 NOTE — Assessment & Plan Note (Signed)
Patient has a history of hypercholesterolemia.  He is on Lipitor 40 mg daily.  He is not having any myalgias.

## 2013-10-14 NOTE — Assessment & Plan Note (Signed)
Blood pressure is staying normal on current therapy.  No chest pain or shortness of breath or palpitations

## 2013-10-14 NOTE — Assessment & Plan Note (Signed)
EKG today shows right bundle branch block.  We do not have any prior tracings for comparison.  He has not been having any dizzy spells or syncope

## 2013-10-14 NOTE — Patient Instructions (Signed)
Will obtain labs today and call you with the results (lp/bmet/hfp)  Your physician recommends that you continue on your current medications as directed. Please refer to the Current Medication list given to you today.  Your physician wants you to follow-up in: 6 months with fasting labs (lp/bmet/hfp)  You will receive a reminder letter in the mail two months in advance. If you don't receive a letter, please call our office to schedule the follow-up appointment.  

## 2013-10-14 NOTE — Progress Notes (Signed)
Robert Bright Date of Birth:  08-03-1930 [facility] [address] [place], [place]  [address] [phone]        Fax   [phone]   History of Present Illness: This 80 year old gentleman is seen for a followup office visit. He has a history of essential hypertension and a history of hypercholesterolemia. He does not have any history of chest pain or ischemic heart disease. He had a normal nuclear stress test in 2004. He does have a past history of prostate cancer and a more recent history of bladder cancer. Dr. Alinda Bright is his urologist.  Since last visit he has been feeling well except he has developed a fresh cold with coryza.    Current Outpatient Prescriptions  Medication Sig Dispense Refill  . ALPHAGAN P 0.1 % SOLN As directed      . amLODipine (NORVASC) 5 MG tablet Take 1 tablet (5 mg total) by mouth daily.  30 tablet  11  . aspirin 81 MG tablet Take 81 mg by mouth daily.        [name] atorvastatin (LIPITOR) 40 MG tablet Take 1 tablet (40 mg total) by mouth daily.  30 tablet  10  . ferrous sulfate 325 (65 FE) MG tablet Take 325 mg by mouth daily.        . hydrochlorothiazide (HYDRODIURIL) 25 MG tablet Take 1 tablet (25 mg total) by mouth every other day.  30 tablet  11  . LORazepam (ATIVAN) 0.5 MG tablet Take 1 tablet (0.5 mg total) by mouth as needed.  30 tablet  5  . moxifloxacin (VIGAMOX) 0.5 % ophthalmic solution One-two drops left eye q 2hrs till better  3 mL  1  . Multiple Vitamin (MULTIVITAMIN PO) Take by mouth.        . potassium chloride SA (K-DUR,KLOR-CON) 20 MEQ tablet Take 1 tablet (20 mEq total) by mouth daily.  30 tablet  11  . valsartan (DIOVAN) 160 MG tablet Take 1 tablet (160 mg total) by mouth daily.  30 tablet  11  . VIAGRA 100 MG tablet As needed      . sildenafil (VIAGRA) 50 MG tablet Take 1 tablet (50 mg total) by mouth daily as needed for erectile dysfunction.  10 tablet  0  . [DISCONTINUED] metoprolol tartrate (LOPRESSOR) 25 MG  tablet Take 25 mg by mouth as needed.         No current facility-administered medications for this visit.    Allergies  Allergen Reactions  . Ramipril     cough    Patient Active Problem List   Diagnosis Date Noted  . Sciatica neuralgia 05/02/2012  . Hypercholesterolemia 01/18/2011  . Benign hypertensive heart disease without heart failure 01/18/2011  . Hx of bladder cancer 01/18/2011  . History of prostate cancer 01/18/2011    History  Smoking status  . Former Smoker  . Quit date: 01/16/1986  Smokeless tobacco  . Never Used    History  Alcohol Use No    No family history on file.  Review of Systems: Constitutional: no fever chills diaphoresis or fatigue or change in weight.  Head and neck: no hearing loss, no epistaxis, no photophobia or visual disturbance. Respiratory: No cough, shortness of breath or wheezing. Cardiovascular: No chest pain peripheral edema, palpitations. Gastrointestinal: No abdominal distention, no abdominal pain, no change in bowel habits hematochezia or melena. Genitourinary: No dysuria, no frequency, no urgency, no nocturia. Musculoskeletal:No arthralgias, no back pain, no gait disturbance  or myalgias. Neurological: No dizziness, no headaches, no numbness, no seizures, no syncope, no weakness, no tremors. Hematologic: No lymphadenopathy, no easy bruising. Psychiatric: No confusion, no hallucinations, no sleep disturbance.    Physical Exam: Filed Vitals:   10/14/13 1034  BP: 130/68  Pulse: 57   the general appearance reveals a well-developed well-nourished gentleman in no distress.The head and neck exam reveals pupils equal and reactive.  Extraocular movements are full.  There is no scleral icterus.  The mouth and pharynx are normal.  The neck is supple.  The carotids reveal no bruits.  The jugular venous pressure is normal.  The  thyroid is not enlarged.  There is no lymphadenopathy.  The chest is clear to percussion and auscultation.   There are no rales or rhonchi.  Expansion of the chest is symmetrical.  The precordium is quiet.  The first heart sound is normal.  The second heart sound is physiologically split.  There is no murmur gallop rub or click.  There is no abnormal lift or heave.  The abdomen is soft and nontender.  The bowel sounds are normal.  The liver and spleen are not enlarged.  There are no abdominal masses.  There are no abdominal bruits.  Extremities reveal good pedal pulses.  There is no phlebitis or edema.  There is no cyanosis or clubbing.  Strength is normal and symmetrical in all extremities.  There is no lateralizing weakness.  There are no sensory deficits.  The skin is warm and dry.  There is no rash.  EKG shows normal sinus rhythm with right bundle branch block.  No acute ischemic changes.  No prior tracings for comparison.   Assessment / Plan: 1. essential hypertension controlled on medication 2. Hyperlipidemia 3. right bundle branch block asymptomatic  Plan: Continue current medication.  Lab work today pending.  Recheck in 6 months for office visit lipid panel hepatic function panel and basal metabolic panel. At his request gave him a prescription for Prevnar 13 pneumococcal vaccine

## 2013-10-15 NOTE — Progress Notes (Signed)
Quick Note:  Please report to patient. The recent labs are stable. Continue same medication and careful diet. ______ 

## 2014-03-05 ENCOUNTER — Other Ambulatory Visit: Payer: Self-pay | Admitting: *Deleted

## 2014-03-05 DIAGNOSIS — I119 Hypertensive heart disease without heart failure: Secondary | ICD-10-CM

## 2014-03-05 DIAGNOSIS — F419 Anxiety disorder, unspecified: Secondary | ICD-10-CM

## 2014-03-05 MED ORDER — LORAZEPAM 0.5 MG PO TABS: 0.5000 mg | ORAL_TABLET | ORAL | Status: DC | PRN

## 2014-03-05 MED ORDER — AMLODIPINE BESYLATE 5 MG PO TABS: 5.0000 mg | ORAL_TABLET | Freq: Every day | ORAL | Status: DC

## 2014-03-05 MED ORDER — VALSARTAN 160 MG PO TABS: 160.0000 mg | ORAL_TABLET | Freq: Every day | ORAL | Status: DC

## 2014-04-20 ENCOUNTER — Ambulatory Visit (INDEPENDENT_AMBULATORY_CARE_PROVIDER_SITE_OTHER): Payer: Medicare Other | Admitting: Cardiology

## 2014-04-20 ENCOUNTER — Encounter: Payer: Self-pay | Admitting: Cardiology

## 2014-04-20 VITALS — BP 142/64 | HR 50 | Ht 70.0 in | Wt 161.0 lb

## 2014-04-20 DIAGNOSIS — F419 Anxiety disorder, unspecified: Secondary | ICD-10-CM

## 2014-04-20 DIAGNOSIS — E7800 Pure hypercholesterolemia, unspecified: Secondary | ICD-10-CM

## 2014-04-20 DIAGNOSIS — I119 Hypertensive heart disease without heart failure: Secondary | ICD-10-CM

## 2014-04-20 DIAGNOSIS — E785 Hyperlipidemia, unspecified: Secondary | ICD-10-CM

## 2014-04-20 DIAGNOSIS — E876 Hypokalemia: Secondary | ICD-10-CM

## 2014-04-20 DIAGNOSIS — R634 Abnormal weight loss: Secondary | ICD-10-CM

## 2014-04-20 LAB — HEPATIC FUNCTION PANEL
ALT: 21 U/L (ref 0–53)
AST: 21 U/L (ref 0–37)
Albumin: 3.8 g/dL (ref 3.5–5.2)
Alkaline Phosphatase: 55 U/L (ref 39–117)
Bilirubin, Direct: 0.2 mg/dL (ref 0.0–0.3)
Total Bilirubin: 1 mg/dL (ref 0.2–1.2)
Total Protein: 7.6 g/dL (ref 6.0–8.3)

## 2014-04-20 LAB — BASIC METABOLIC PANEL
BUN: 20 mg/dL (ref 6–23)
CALCIUM: 9.6 mg/dL (ref 8.4–10.5)
CO2: 24 mEq/L (ref 19–32)
Chloride: 103 mEq/L (ref 96–112)
Creatinine, Ser: 1.5 mg/dL (ref 0.4–1.5)
GFR: 48.41 mL/min — AB (ref >60.00)
Glucose, Bld: 87 mg/dL (ref 70–99)
POTASSIUM: 4 meq/L (ref 3.5–5.1)
SODIUM: 134 meq/L — AB (ref 135–145)

## 2014-04-20 LAB — CBC WITH DIFFERENTIAL/PLATELET
Basophils Absolute: 0 10*3/uL (ref 0.0–0.1)
Basophils Relative: 0.2 % (ref 0.0–3.0)
EOS PCT: 3.5 % (ref 0.0–5.0)
Eosinophils Absolute: 0.3 10*3/uL (ref 0.0–0.7)
HCT: 42 % (ref 39.0–52.0)
Hemoglobin: 13.8 g/dL (ref 13.0–17.0)
LYMPHS PCT: 28.6 % (ref 12.0–46.0)
Lymphs Abs: 2.4 10*3/uL (ref 0.7–4.0)
MCHC: 32.8 g/dL (ref 30.0–36.0)
MCV: 92.1 fl (ref 78.0–100.0)
MONOS PCT: 8.4 % (ref 3.0–12.0)
Monocytes Absolute: 0.7 10*3/uL (ref 0.1–1.0)
NEUTROS PCT: 59.3 % (ref 43.0–77.0)
Neutro Abs: 5 10*3/uL (ref 1.4–7.7)
PLATELETS: 201 10*3/uL (ref 150.0–400.0)
RBC: 4.57 Mil/uL (ref 4.22–5.81)
RDW: 14.2 % (ref 11.5–15.5)
WBC: 8.4 10*3/uL (ref 4.0–10.5)

## 2014-04-20 LAB — LIPID PANEL
CHOLESTEROL: 136 mg/dL (ref 0–200)
HDL: 57.8 mg/dL (ref >39.00)
LDL CALC: 55 mg/dL (ref 0–99)
NonHDL: 78.2
Total CHOL/HDL Ratio: 2
Triglycerides: 116 mg/dL (ref 0.0–149.0)
VLDL: 23.2 mg/dL (ref 0.0–40.0)

## 2014-04-20 MED ORDER — POTASSIUM CHLORIDE CRYS ER 20 MEQ PO TBCR: 20.0000 meq | EXTENDED_RELEASE_TABLET | Freq: Every day | ORAL | Status: DC

## 2014-04-20 MED ORDER — LORAZEPAM 0.5 MG PO TABS: 0.5000 mg | ORAL_TABLET | ORAL | Status: DC | PRN

## 2014-04-20 MED ORDER — VALSARTAN 160 MG PO TABS: 160.0000 mg | ORAL_TABLET | Freq: Every day | ORAL | Status: DC

## 2014-04-20 MED ORDER — AMLODIPINE BESYLATE 5 MG PO TABS: 5.0000 mg | ORAL_TABLET | Freq: Every day | ORAL | Status: DC

## 2014-04-20 MED ORDER — ATORVASTATIN CALCIUM 40 MG PO TABS: 40.0000 mg | ORAL_TABLET | Freq: Every day | ORAL | Status: DC

## 2014-04-20 MED ORDER — HYDROCHLOROTHIAZIDE 25 MG PO TABS: 25.0000 mg | ORAL_TABLET | ORAL | Status: DC

## 2014-04-20 NOTE — Assessment & Plan Note (Signed)
Patient has a past history of hypercholesterolemia and is on Lipitor 40 mg daily.  We are checking lab work today fasting.

## 2014-04-20 NOTE — Assessment & Plan Note (Signed)
His weight is down 8 pounds since last visit.  He thinks that some of this may be related to some dental problems with a new partial plate.  We are checking lab work including CBC today.  He has not been aware of any change in bowel habits hematochezia or melena.

## 2014-04-20 NOTE — Progress Notes (Signed)
Robert Bright Date of Birth:  03-16-1931 [facility] [address] [place], [place]  [address] [phone]        Fax   [phone]   History of Present Illness: This 80 year old gentleman is seen for a followup office visit. He has a history of essential hypertension and a history of hypercholesterolemia. He does not have any history of chest pain or ischemic heart disease. He had a normal nuclear stress test in 2004. He does have a past history of prostate cancer and a more recent history of bladder cancer. [name] is his urologist.  Since last visit he has been feeling well . He has had unexplained 8 pound weight loss.   Current Outpatient Prescriptions  Medication Sig Dispense Refill  . ALPHAGAN P 0.1 % SOLN As directed    . amLODipine (NORVASC) 5 MG tablet Take 1 tablet (5 mg total) by mouth daily. 30 tablet 11  . aspirin 81 MG tablet Take 81 mg by mouth daily.      [name] atorvastatin (LIPITOR) 40 MG tablet Take 1 tablet (40 mg total) by mouth daily. 30 tablet 11  . ferrous sulfate 325 (65 FE) MG tablet Take 325 mg by mouth daily.      . hydrochlorothiazide (HYDRODIURIL) 25 MG tablet Take 1 tablet (25 mg total) by mouth every other day. 30 tablet 11  . LORazepam (ATIVAN) 0.5 MG tablet Take 1 tablet (0.5 mg total) by mouth as needed. 30 tablet 5  . moxifloxacin (VIGAMOX) 0.5 % ophthalmic solution One-two drops left eye q 2hrs till better 3 mL 1  . Multiple Vitamin (MULTIVITAMIN PO) Take by mouth.      . valsartan (DIOVAN) 160 MG tablet Take 1 tablet (160 mg total) by mouth daily. 30 tablet 11  . VIAGRA 100 MG tablet As needed    . potassium chloride SA (K-DUR,KLOR-CON) 20 MEQ tablet Take 1 tablet (20 mEq total) by mouth daily. 30 tablet 11  . sildenafil (VIAGRA) 50 MG tablet Take 1 tablet (50 mg total) by mouth daily as needed for erectile dysfunction. 10 tablet 0  . [DISCONTINUED] metoprolol tartrate (LOPRESSOR) 25 MG tablet Take 25 mg by mouth as  needed.       No current facility-administered medications for this visit.    Allergies  Allergen Reactions  . Ramipril     cough    Patient Active Problem List   Diagnosis Date Noted  . Weight loss, unintentional 04/20/2014  . Right bundle branch block 10/14/2013  . Sciatica neuralgia 05/02/2012  . Hypercholesterolemia 01/18/2011  . Benign hypertensive heart disease without heart failure 01/18/2011  . Hx of bladder cancer 01/18/2011  . History of prostate cancer 01/18/2011    History  Smoking status  . Former Smoker  . Quit date: 01/16/1986  Smokeless tobacco  . Never Used    History  Alcohol Use No    No family history on file.  Review of Systems: Constitutional: no fever chills diaphoresis or fatigue or change in weight.  Head and neck: no hearing loss, no epistaxis, no photophobia or visual disturbance. Respiratory: No cough, shortness of breath or wheezing. Cardiovascular: No chest pain peripheral edema, palpitations. Gastrointestinal: No abdominal distention, no abdominal pain, no change in bowel habits hematochezia or melena. Genitourinary: No dysuria, no frequency, no urgency, no nocturia. Musculoskeletal:No arthralgias, no back pain, no gait disturbance or myalgias. Neurological: No dizziness, no headaches, no numbness, no seizures, no syncope, no weakness, no  tremors. Hematologic: No lymphadenopathy, no easy bruising. Psychiatric: No confusion, no hallucinations, no sleep disturbance.    Physical Exam: Filed Vitals:   04/20/14 1500  BP: 142/64  Pulse: 50   the general appearance reveals a well-developed well-nourished gentleman in no distress.The head and neck exam reveals pupils equal and reactive.  Extraocular movements are full.  There is no scleral icterus.  The mouth and pharynx are normal.  The neck is supple.  The carotids reveal no bruits.  The jugular venous pressure is normal.  The  thyroid is not enlarged.  There is no lymphadenopathy.  The  chest is clear to percussion and auscultation.  There are no rales or rhonchi.  Expansion of the chest is symmetrical.  The precordium is quiet.  The first heart sound is normal.  The second heart sound is physiologically split.  There is no murmur gallop rub or click.  There is no abnormal lift or heave.  The abdomen is soft and nontender.  The bowel sounds are normal.  The liver and spleen are not enlarged.  There are no abdominal masses.  There are no abdominal bruits.  Extremities reveal good pedal pulses.  There is no phlebitis or edema.  There is no cyanosis or clubbing.  Strength is normal and symmetrical in all extremities.  There is no lateralizing weakness.  There are no sensory deficits.  The skin is warm and dry.  There is no rash.     Assessment / Plan: 1. essential hypertension controlled on medication 2. Hyperlipidemia 3. right bundle branch block asymptomatic 4. Weight loss, unintentional  Plan: Continue current medication.  Lab work today pending.  Recheck in 6 months for office visit lipid panel hepatic function panel and basal metabolic panel.

## 2014-04-20 NOTE — Assessment & Plan Note (Signed)
No chest pain or shortness of breath. 

## 2014-04-20 NOTE — Patient Instructions (Signed)
Will obtain labs today and call you with the results (lp/bmet/hfp/cbc)  Your physician recommends that you continue on your current medications as directed. Please refer to the Current Medication list given to you today.  Your physician wants you to follow-up in: 6 months with fasting labs (lp/bmet/hfp) You will receive a reminder letter in the mail two months in advance. If you don't receive a letter, please call our office to schedule the follow-up appointment.

## 2014-04-21 NOTE — Progress Notes (Signed)
Quick Note:  Please report to patient. The recent labs are stable. Continue same medication and careful diet. ______ 

## 2014-04-26 ENCOUNTER — Other Ambulatory Visit: Payer: Self-pay | Admitting: Cardiology

## 2014-05-02 ENCOUNTER — Other Ambulatory Visit: Payer: Self-pay | Admitting: Cardiology

## 2014-05-20 ENCOUNTER — Telehealth: Payer: Self-pay | Admitting: *Deleted

## 2014-05-20 NOTE — Telephone Encounter (Signed)
New message   Left vm for pt concerning flu shot.

## 2014-07-03 ENCOUNTER — Telehealth: Payer: Self-pay | Admitting: Cardiology

## 2014-07-03 NOTE — Telephone Encounter (Signed)
New Message        Pt calling wanting to know when he is due to see [name] again. No recall in Epic. Please call back and advise.

## 2014-07-03 NOTE — Telephone Encounter (Signed)
Left message should be getting recall letter in March

## 2014-11-02 ENCOUNTER — Ambulatory Visit: Payer: Self-pay | Admitting: Cardiology

## 2014-12-15 ENCOUNTER — Ambulatory Visit (INDEPENDENT_AMBULATORY_CARE_PROVIDER_SITE_OTHER): Payer: Medicare Other | Admitting: Cardiology

## 2014-12-15 ENCOUNTER — Encounter: Payer: Self-pay | Admitting: Cardiology

## 2014-12-15 VITALS — BP 122/86 | HR 53 | Ht 70.0 in | Wt 163.1 lb

## 2014-12-15 DIAGNOSIS — Z8551 Personal history of malignant neoplasm of bladder: Secondary | ICD-10-CM

## 2014-12-15 DIAGNOSIS — E785 Hyperlipidemia, unspecified: Secondary | ICD-10-CM

## 2014-12-15 DIAGNOSIS — I119 Hypertensive heart disease without heart failure: Secondary | ICD-10-CM | POA: Diagnosis not present

## 2014-12-15 DIAGNOSIS — I4510 Unspecified right bundle-branch block: Secondary | ICD-10-CM | POA: Diagnosis not present

## 2014-12-15 DIAGNOSIS — E7800 Pure hypercholesterolemia, unspecified: Secondary | ICD-10-CM

## 2014-12-15 LAB — LIPID PANEL
Cholesterol: 133 mg/dL (ref 0–200)
HDL: 51.4 mg/dL (ref >39.00)
LDL CALC: 64 mg/dL (ref 0–99)
NONHDL: 81.6
Total CHOL/HDL Ratio: 3
Triglycerides: 90 mg/dL (ref 0.0–149.0)
VLDL: 18 mg/dL (ref 0.0–40.0)

## 2014-12-15 LAB — BASIC METABOLIC PANEL
BUN: 29 mg/dL — AB (ref 6–23)
CALCIUM: 9.6 mg/dL (ref 8.4–10.5)
CO2: 26 meq/L (ref 19–32)
CREATININE: 1.53 mg/dL — AB (ref 0.40–1.50)
Chloride: 104 mEq/L (ref 96–112)
GFR: 46.52 mL/min — ABNORMAL LOW (ref >60.00)
GLUCOSE: 89 mg/dL (ref 70–99)
Potassium: 4.3 mEq/L (ref 3.5–5.1)
Sodium: 136 mEq/L (ref 135–145)

## 2014-12-15 LAB — CBC WITH DIFFERENTIAL/PLATELET
Basophils Absolute: 0 10*3/uL (ref 0.0–0.1)
Basophils Relative: 0.2 % (ref 0.0–3.0)
EOS ABS: 0.3 10*3/uL (ref 0.0–0.7)
Eosinophils Relative: 4 % (ref 0.0–5.0)
HCT: 41.2 % (ref 39.0–52.0)
Hemoglobin: 13.6 g/dL (ref 13.0–17.0)
LYMPHS ABS: 2.1 10*3/uL (ref 0.7–4.0)
Lymphocytes Relative: 27.1 % (ref 12.0–46.0)
MCHC: 33.1 g/dL (ref 30.0–36.0)
MCV: 90.4 fl (ref 78.0–100.0)
MONO ABS: 0.7 10*3/uL (ref 0.1–1.0)
Monocytes Relative: 9.1 % (ref 3.0–12.0)
Neutro Abs: 4.6 10*3/uL (ref 1.4–7.7)
Neutrophils Relative %: 59.6 % (ref 43.0–77.0)
Platelets: 174 10*3/uL (ref 150.0–400.0)
RBC: 4.56 Mil/uL (ref 4.22–5.81)
RDW: 14.6 % (ref 11.5–15.5)
WBC: 7.8 10*3/uL (ref 4.0–10.5)

## 2014-12-15 LAB — HEPATIC FUNCTION PANEL
ALT: 15 U/L (ref 0–53)
AST: 18 U/L (ref 0–37)
Albumin: 4.1 g/dL (ref 3.5–5.2)
Alkaline Phosphatase: 53 U/L (ref 39–117)
Bilirubin, Direct: 0 mg/dL (ref 0.0–0.3)
TOTAL PROTEIN: 7.4 g/dL (ref 6.0–8.3)
Total Bilirubin: 0.6 mg/dL (ref 0.2–1.2)

## 2014-12-15 MED ORDER — DOXYCYCLINE HYCLATE 100 MG PO CAPS: 100.0000 mg | ORAL_CAPSULE | Freq: Two times a day (BID) | ORAL | Status: DC

## 2014-12-15 NOTE — Progress Notes (Signed)
Cardiology Office Note   Date:  12/15/2014   ID:  Robert Bright, DOB 1931-02-05, MRN [id number]  PCP:  Warren Danes, MD  Cardiologist: Darlin Coco MD  Chief Complaint  Patient presents with  . Labs Only      History of Present Illness: Robert Bright is a 81 y.o. male who presents for a six-month follow-up visit.  [name] He has a history of essential hypertension and a history of hypercholesterolemia. He does not have any history of chest pain or ischemic heart disease. He had a normal nuclear stress test in 2004. He does have a past history of prostate cancer and a more recent history of bladder cancer. [name] is his urologist. Since last visit he has been feeling well . his appetite is stable and he has gained 2 pounds since last visit .  he will be [place] to [place] later this summer and will be doing a lot of yard work on his property there.  At his request we gave him a prescription for doxycycline milligrams twice a day to be used in case of tick bites.  The area is highly infested with ticks.  Past Medical History  Diagnosis Date  . Vertigo   . Hypertension     with a component of whitecoat syndrome  . Hyperlipidemia   . Hypercholesterolemia   . Bladder cancer 1993    Being followed by [name]  . Prostate cancer 1993    Hx ostatectomyof remote treated with radial pr  . Shingles     Past Surgical History  Procedure Laterality Date  . Prostatectomy  1993  . Cardiovascular stress test  01-02-2003    EF 68%  . Inguinal hernia repair    . Colonoscopy       Current Outpatient Prescriptions  Medication Sig Dispense Refill  . amLODipine (NORVASC) 5 MG tablet Take 1 tablet (5 mg total) by mouth daily. 30 tablet 11  . aspirin 81 MG tablet Take 81 mg by mouth daily.      [name] atorvastatin (LIPITOR) 40 MG tablet Take 1 tablet (40 mg total) by mouth daily. 30 tablet 11  . ferrous sulfate 325 (65 FE) MG tablet Take 325 mg by mouth  daily.      . hydrochlorothiazide (HYDRODIURIL) 25 MG tablet Take 1 tablet (25 mg total) by mouth every other day. 30 tablet 11  . LORazepam (ATIVAN) 0.5 MG tablet Take 0.5 mg by mouth at bedtime as needed for anxiety (sleep).    . moxifloxacin (VIGAMOX) 0.5 % ophthalmic solution One-two drops left eye q 2hrs till better 3 mL 1  . Multiple Vitamin (MULTIVITAMIN PO) Take 1 tablet by mouth daily.     . potassium chloride SA (K-DUR,KLOR-CON) 20 MEQ tablet Take 1 tablet (20 mEq total) by mouth daily. 30 tablet 11  . valsartan (DIOVAN) 160 MG tablet Take 1 tablet (160 mg total) by mouth daily. 30 tablet 11  . doxycycline (VIBRAMYCIN) 100 MG capsule Take 1 capsule (100 mg total) by mouth 2 (two) times daily. 20 capsule 0  . sildenafil (VIAGRA) 50 MG tablet Take 1 tablet (50 mg total) by mouth daily as needed for erectile dysfunction. 10 tablet 0  . [DISCONTINUED] metoprolol tartrate (LOPRESSOR) 25 MG tablet Take 25 mg by mouth as needed.       No current facility-administered medications for this visit.    Allergies:   Ramipril    Social History:  The patient  reports that he quit smoking about 28 years ago. He has never used smokeless tobacco. He reports that he does not drink alcohol or use illicit drugs.   Family History:  The patient's family history includes Arthritis/Rheumatoid in his father; Dementia in his mother; Healthy in his sister and sister.    ROS:  Please see the history of present illness.   Otherwise, review of systems are positive for none.   All other systems are reviewed and negative.    PHYSICAL EXAM: VS:  BP 122/86 mmHg  Pulse 53  Ht 5\' 10"  (1.778 m)  Wt 163 lb 1.9 oz (73.991 kg)  BMI 23.41 kg/m2 , BMI Body mass index is 23.41 kg/(m^2). GEN: Well nourished, well developed, in no acute distress HEENT: normal Neck: no JVD, carotid bruits, or masses Cardiac: RRR; no murmurs, rubs, or gallops,no edema  Respiratory:  clear to auscultation bilaterally, normal work of  breathing GI: soft, nontender, nondistended, + BS MS: no deformity or atrophy Skin: warm and dry, no rash Neuro:  Strength and sensation are intact Psych: euthymic mood, full affect   EKG:  EKG is ordered today. The ekg ordered today demonstrates  normal sinus rhythm with occasional PACs, right bundle branch block, LVH with secondary ST-T wave changes.  Since previous tracing of 10/14/13, no significant change.  ent Labs: 04/20/2014: ALT 21; BUN 20; Creatinine, Ser 1.5; Hemoglobin 13.8; Platelets 201.0; Potassium 4.0; Sodium 134*    Lipid Panel    Component Value Date/Time   CHOL 136 04/20/2014 1554   TRIG 116.0 04/20/2014 1554   HDL 57.80 04/20/2014 1554   CHOLHDL 2 04/20/2014 1554   VLDL 23.2 04/20/2014 1554   LDLCALC 55 04/20/2014 1554      Wt Readings from Last 3 Encounters:  12/15/14 163 lb 1.9 oz (73.991 kg)  04/20/14 161 lb (73.029 kg)  10/14/13 169 lb (76.658 kg)        ASSESSMENT AND PLAN:  1. essential hypertension controlled on medication 2. Hyperlipidemia 3. right bundle branch block asymptomatic 4. Weight loss, , Resolved 5.  History of prostate cancer and history of bladder cancer, both followed by [name]  Plan: Continue current medication. Lab work today pending. Recheck in 6 months for office visit lipid panel hepatic function panel and basal metabolic panel.   Current medicines are reviewed at length with the patient today.  The patient does not have concerns regarding medicines.  The following changes have been made:  no change  Labs/ tests ordered today include:   Orders Placed This Encounter  Procedures  . CBC with Differential/Platelet  . Lipid panel  . Hepatic function panel  . Basic metabolic panel  . EKG 12-Lead     Signed, Darlin Coco MD 12/15/2014 1:29 PM    [facility] [place], [place],   [address] Phone: [phone]; Fax: [phone]

## 2014-12-15 NOTE — Progress Notes (Signed)
Quick Note:  Please report to patient. The recent labs are stable. Continue same medication and careful diet. ______ 

## 2014-12-15 NOTE — Patient Instructions (Addendum)
Medication Instructions:  Rx for Doxycycline 100 mg twice a day for 10 days printed   Labwork: Lp/bmet/hfp/cbc  Testing/Procedures: none  Follow-Up: Your physician wants you to follow-up in: 6 months with fasting labs (lp/bmet/hfp)  You will receive a reminder letter in the mail two months in advance. If you don't receive a letter, please call our office to schedule the follow-up appointment.

## 2015-02-26 ENCOUNTER — Other Ambulatory Visit: Payer: Self-pay | Admitting: *Deleted

## 2015-02-26 DIAGNOSIS — I119 Hypertensive heart disease without heart failure: Secondary | ICD-10-CM

## 2015-02-26 MED ORDER — VALSARTAN 160 MG PO TABS: 160.0000 mg | ORAL_TABLET | Freq: Every day | ORAL | Status: DC

## 2015-02-26 MED ORDER — AMLODIPINE BESYLATE 5 MG PO TABS: 5.0000 mg | ORAL_TABLET | Freq: Every day | ORAL | Status: DC

## 2015-02-26 MED ORDER — ATORVASTATIN CALCIUM 40 MG PO TABS: 40.0000 mg | ORAL_TABLET | Freq: Every day | ORAL | Status: DC

## 2015-03-23 ENCOUNTER — Other Ambulatory Visit: Payer: Self-pay | Admitting: Cardiology

## 2015-03-23 DIAGNOSIS — F419 Anxiety disorder, unspecified: Secondary | ICD-10-CM

## 2015-03-24 NOTE — Telephone Encounter (Signed)
[name] printed Rx, called to pharmacy with refill Verbal ok given by  [name]

## 2015-04-29 ENCOUNTER — Other Ambulatory Visit: Payer: Self-pay | Admitting: Cardiology

## 2015-06-22 ENCOUNTER — Telehealth: Payer: Self-pay | Admitting: Cardiology

## 2015-06-22 DIAGNOSIS — E7800 Pure hypercholesterolemia, unspecified: Secondary | ICD-10-CM

## 2015-06-22 NOTE — Telephone Encounter (Signed)
New message    Patient wants to discuss new Physicians. Patient aware that nurse is not in the office today.

## 2015-06-23 NOTE — Telephone Encounter (Signed)
Left message to call back  

## 2015-06-23 NOTE — Telephone Encounter (Signed)
Spoke with patient and moved his appointment up to be on the same day with his wife

## 2015-07-02 ENCOUNTER — Other Ambulatory Visit (INDEPENDENT_AMBULATORY_CARE_PROVIDER_SITE_OTHER): Payer: Medicare Other | Admitting: *Deleted

## 2015-07-02 DIAGNOSIS — E7800 Pure hypercholesterolemia, unspecified: Secondary | ICD-10-CM

## 2015-07-02 LAB — BASIC METABOLIC PANEL
BUN: 25 mg/dL (ref 7–25)
CHLORIDE: 106 mmol/L (ref 98–110)
CO2: 24 mmol/L (ref 20–31)
Calcium: 9.4 mg/dL (ref 8.6–10.3)
Creat: 1.63 mg/dL — ABNORMAL HIGH (ref 0.70–1.11)
GLUCOSE: 76 mg/dL (ref 65–99)
POTASSIUM: 4.1 mmol/L (ref 3.5–5.3)
SODIUM: 138 mmol/L (ref 135–146)

## 2015-07-02 LAB — HEPATIC FUNCTION PANEL
ALBUMIN: 3.9 g/dL (ref 3.6–5.1)
ALK PHOS: 49 U/L (ref 40–115)
ALT: 19 U/L (ref 9–46)
AST: 23 U/L (ref 10–35)
BILIRUBIN DIRECT: 0.2 mg/dL (ref <=0.2)
BILIRUBIN TOTAL: 0.7 mg/dL (ref 0.2–1.2)
Indirect Bilirubin: 0.5 mg/dL (ref 0.2–1.2)
Total Protein: 6.8 g/dL (ref 6.1–8.1)

## 2015-07-02 LAB — LIPID PANEL
CHOL/HDL RATIO: 2.5 ratio (ref <=5.0)
Cholesterol: 138 mg/dL (ref 125–200)
HDL: 55 mg/dL (ref >=40)
LDL Cholesterol: 62 mg/dL (ref <130)
Triglycerides: 103 mg/dL (ref <150)
VLDL: 21 mg/dL (ref <30)

## 2015-07-02 NOTE — Addendum Note (Signed)
Addended by: Eulis Foster on: 07/02/2015 08:56 AM   Modules accepted: Orders

## 2015-07-03 NOTE — Progress Notes (Signed)
Quick Note:  Please make copy of labs for patient visit. ______ 

## 2015-07-06 ENCOUNTER — Other Ambulatory Visit: Payer: Self-pay | Admitting: Cardiology

## 2015-07-07 ENCOUNTER — Ambulatory Visit (INDEPENDENT_AMBULATORY_CARE_PROVIDER_SITE_OTHER): Payer: Medicare Other | Admitting: Cardiology

## 2015-07-07 ENCOUNTER — Encounter: Payer: Self-pay | Admitting: Cardiology

## 2015-07-07 DIAGNOSIS — I119 Hypertensive heart disease without heart failure: Secondary | ICD-10-CM

## 2015-07-07 DIAGNOSIS — E7800 Pure hypercholesterolemia, unspecified: Secondary | ICD-10-CM | POA: Diagnosis not present

## 2015-07-07 DIAGNOSIS — I4510 Unspecified right bundle-branch block: Secondary | ICD-10-CM

## 2015-07-07 NOTE — Progress Notes (Signed)
Cardiology Office Note   Date:  07/07/2015   ID:  Robert Bright, DOB 1931/05/28, MRN [id number]  PCP:  Warren Danes, MD  Cardiologist: Darlin Coco MD  Chief Complaint  Patient presents with  . routine follow up    Denies chest pain,shortness of breath, le edema, any claudication      History of Present Illness: Robert Bright is a 82 y.o. male who presents for scheduled 6 month follow-up visit.  He is a retired IT trainer.[name] He has a history of essential hypertension and a history of hypercholesterolemia. He does not have any history of chest pain or ischemic heart disease. He had a normal nuclear stress test in 2004. He does have a past history of prostate cancer and a more recent history of bladder cancer. [name] is his urologist. Since last visit he has been feeling well .  When we last saw him [place]  However he states that the events to many doctors visits scheduled that they never got to the beach.  He is disappointed in that. From the cardiac standpoint he has not been experiencing chest pain or shortness of breath.  We reviewed his lab work which is satisfactory.  Past Medical History  Diagnosis Date  . Vertigo   . Hypertension     with a component of whitecoat syndrome  . Hyperlipidemia   . Hypercholesterolemia   . Bladder cancer [facility]) 1993    Being followed by [name]  . Prostate cancer ([place]) 1993    Hx ostatectomyof remote treated with radial pr  . Shingles     Past Surgical History  Procedure Laterality Date  . Prostatectomy  1993  . Cardiovascular stress test  01-02-2003    EF 68%  . Inguinal hernia repair    . Colonoscopy       Current Outpatient Prescriptions  Medication Sig Dispense Refill  . amLODipine (NORVASC) 5 MG tablet Take 1 tablet (5 mg total) by mouth daily. 90 tablet 3  . aspirin 81 MG tablet Take 81 mg by mouth daily.      [name] atorvastatin (LIPITOR) 40  MG tablet Take 1 tablet (40 mg total) by mouth daily. 90 tablet 3  . doxycycline (VIBRAMYCIN) 100 MG capsule Take 100 mg by mouth 2 (two) times daily as needed (tick bites).    . ferrous sulfate 325 (65 FE) MG tablet Take 325 mg by mouth daily.      . hydrochlorothiazide (HYDRODIURIL) 25 MG tablet Take 1 tablet (25 mg total) by mouth every other day. 30 tablet 11  . LORazepam (ATIVAN) 0.5 MG tablet Take 0.5 mg by mouth daily as needed for anxiety.    . moxifloxacin (VIGAMOX) 0.5 % ophthalmic solution One-two drops left eye q 2hrs till better 3 mL 1  . Multiple Vitamin (MULTIVITAMIN PO) Take 1 tablet by mouth daily.     . potassium chloride SA (K-DUR,KLOR-CON) 20 MEQ tablet TAKE 1 TABLET BY MOUTH EVERY DAY 90 tablet 0  . valsartan (DIOVAN) 160 MG tablet Take 1 tablet (160 mg total) by mouth daily. 90 tablet 3  . sildenafil (VIAGRA) 50 MG tablet Take 1 tablet (50 mg total) by mouth daily as needed for erectile dysfunction. 10 tablet 0  . [DISCONTINUED] metoprolol tartrate (LOPRESSOR) 25 MG tablet Take 25 mg by mouth as needed.       No current facility-administered medications for this visit.  Allergies:   Ramipril    Social History:  The patient  reports that he quit smoking about 29 years ago. He has never used smokeless tobacco. He reports that he does not drink alcohol or use illicit drugs.   Family History:  The patient's family history includes Arthritis/Rheumatoid in his father; Dementia in his mother; Healthy in his sister and sister.    ROS:  Please see the history of present illness.   Otherwise, review of systems are positive for none.   All other systems are reviewed and negative.    PHYSICAL EXAM: VS:  BP 118/60 mmHg  Pulse 60  Ht 5\' 10"  (1.778 m)  Wt 171 lb 12.8 oz (77.928 kg)  BMI 24.65 kg/m2 , BMI Body mass index is 24.65 kg/(m^2). GEN: Well nourished, well developed, in no acute distress HEENT: normal Neck: no JVD, carotid bruits, or masses Cardiac: RRR; no  murmurs, rubs, or gallops,no edema  Respiratory:  clear to auscultation bilaterally, normal work of breathing GI: soft, nontender, nondistended, + BS MS: no deformity or atrophy Skin: warm and dry, no rash Neuro:  Strength and sensation are intact Psych: euthymic mood, full affect   EKG:  EKG is not ordered today.    Recent Labs: 12/15/2014: Hemoglobin 13.6; Platelets 174.0 07/02/2015: ALT 19; BUN 25; Creat 1.63*; Potassium 4.1; Sodium 138    Lipid Panel    Component Value Date/Time   CHOL 138 07/02/2015 0857   TRIG 103 07/02/2015 0857   HDL 55 07/02/2015 0857   CHOLHDL 2.5 07/02/2015 0857   VLDL 21 07/02/2015 0857   LDLCALC 62 07/02/2015 0857      Wt Readings from Last 3 Encounters:  07/07/15 171 lb 12.8 oz (77.928 kg)  12/15/14 163 lb 1.9 oz (73.991 kg)  04/20/14 161 lb (73.029 kg)        ASSESSMENT AND PLAN:  1. essential hypertension controlled on medication 2. Hyperlipidemia 3. right bundle branch block asymptomatic 4. Weight loss, , Resolved 5. History of prostate cancer and history of bladder cancer, both followed by [name]  Plan: Continue current medication. Lab work today pending. Recheck in 6 months for office visit lipid panel hepatic function panel and basal metabolic panel.   Current medicines are reviewed at length with the patient today.  The patient does not have concerns regarding medicines.  The following changes have been made:  no change  Labs/ tests ordered today include:   Orders Placed This Encounter  Procedures  . Lipid panel  . Hepatic function panel  . Basic metabolic panel    Disposition: Continue current medication.  Recheck in 6 months for office visit with Dr. Acie Fredrickson.  Berna Spare MD 07/07/2015 6:25 PM    [address], [place], [place]  [address] Phone: [phone]; Fax: [phone]

## 2015-07-07 NOTE — Patient Instructions (Signed)
Medication Instructions:  Your physician recommends that you continue on your current medications as directed. Please refer to the Current Medication list given to you today.  Labwork: NONE  Testing/Procedures: NONE  Follow-Up: Your physician wants you to follow-up in: 6 months with fasting labs (lp/bmet/hfp) WITH DR Acie Fredrickson  You will receive a reminder letter in the mail two months in advance. If you don't receive a letter, please call our office to schedule the follow-up appointment.  If you need a refill on your cardiac medications before your next appointment, please call your pharmacy.

## 2015-07-09 ENCOUNTER — Telehealth: Payer: Self-pay | Admitting: Cardiology

## 2015-07-09 NOTE — Telephone Encounter (Signed)
Spoke with patient regarding follow up appointment/refills with Dr Acie Fredrickson

## 2015-07-09 NOTE — Telephone Encounter (Signed)
Left message to call back  

## 2015-07-09 NOTE — Telephone Encounter (Signed)
F/u  Pt returning RN phone call 

## 2015-07-09 NOTE — Telephone Encounter (Signed)
New message      Talk to the nurse about medical care after Dr Wendall Papa

## 2015-07-28 ENCOUNTER — Ambulatory Visit: Payer: Medicare Other | Admitting: Cardiology

## 2015-10-01 ENCOUNTER — Other Ambulatory Visit: Payer: Self-pay

## 2016-01-04 ENCOUNTER — Encounter: Payer: Self-pay | Admitting: Cardiovascular Disease

## 2016-01-04 ENCOUNTER — Ambulatory Visit (INDEPENDENT_AMBULATORY_CARE_PROVIDER_SITE_OTHER): Payer: Medicare Other | Admitting: Cardiovascular Disease

## 2016-01-04 VITALS — BP 140/80 | HR 46 | Ht 70.0 in | Wt 170.4 lb

## 2016-01-04 DIAGNOSIS — I119 Hypertensive heart disease without heart failure: Secondary | ICD-10-CM | POA: Diagnosis not present

## 2016-01-04 DIAGNOSIS — I10 Essential (primary) hypertension: Secondary | ICD-10-CM

## 2016-01-04 DIAGNOSIS — E7800 Pure hypercholesterolemia, unspecified: Secondary | ICD-10-CM

## 2016-01-04 LAB — COMPREHENSIVE METABOLIC PANEL
ALBUMIN: 4 g/dL (ref 3.6–5.1)
ALK PHOS: 49 U/L (ref 40–115)
ALT: 16 U/L (ref 9–46)
AST: 20 U/L (ref 10–35)
BILIRUBIN TOTAL: 0.7 mg/dL (ref 0.2–1.2)
BUN: 28 mg/dL — ABNORMAL HIGH (ref 7–25)
CALCIUM: 9.5 mg/dL (ref 8.6–10.3)
CO2: 22 mmol/L (ref 20–31)
Chloride: 105 mmol/L (ref 98–110)
Creat: 1.74 mg/dL — ABNORMAL HIGH (ref 0.70–1.11)
Glucose, Bld: 82 mg/dL (ref 65–99)
Potassium: 4.4 mmol/L (ref 3.5–5.3)
Sodium: 138 mmol/L (ref 135–146)
TOTAL PROTEIN: 7 g/dL (ref 6.1–8.1)

## 2016-01-04 LAB — LIPID PANEL
CHOLESTEROL: 137 mg/dL (ref 125–200)
HDL: 61 mg/dL (ref >=40)
LDL Cholesterol: 57 mg/dL (ref <130)
TRIGLYCERIDES: 96 mg/dL (ref <150)
Total CHOL/HDL Ratio: 2.2 Ratio (ref <=5.0)
VLDL: 19 mg/dL (ref <30)

## 2016-01-04 MED ORDER — VIAGRA 50 MG PO TABS
50.0000 mg | ORAL_TABLET | Freq: Every day | ORAL | Status: DC | PRN
Start: 2016-01-04 — End: 2021-04-05

## 2016-01-04 MED ORDER — VALSARTAN 160 MG PO TABS: 160.0000 mg | ORAL_TABLET | Freq: Every day | ORAL | Status: DC

## 2016-01-04 MED ORDER — ATORVASTATIN CALCIUM 40 MG PO TABS: 40.0000 mg | ORAL_TABLET | Freq: Every day | ORAL | Status: DC

## 2016-01-04 MED ORDER — POTASSIUM CHLORIDE CRYS ER 20 MEQ PO TBCR: 20.0000 meq | EXTENDED_RELEASE_TABLET | Freq: Every day | ORAL | Status: DC

## 2016-01-04 MED ORDER — HYDROCHLOROTHIAZIDE 25 MG PO TABS: 25.0000 mg | ORAL_TABLET | ORAL | Status: DC

## 2016-01-04 MED ORDER — AMLODIPINE BESYLATE 5 MG PO TABS: 5.0000 mg | ORAL_TABLET | Freq: Every day | ORAL | Status: DC

## 2016-01-04 NOTE — Patient Instructions (Signed)
Medication Instructions:  None  Labwork: CMET and Lipids today  Testing/Procedures: None  Follow-Up: Your physician wants you to follow-up in: 6 months with Dr. Nahser. You will receive a reminder letter in the mail two months in advance. If you don't receive a letter, please call our office to schedule the follow-up appointment.   Any Other Special Instructions Will Be Listed Below (If Applicable).     If you need a refill on your cardiac medications before your next appointment, please call your pharmacy.   

## 2016-01-04 NOTE — Progress Notes (Signed)
Cardiology Office Note   Date:  01/04/2016   ID:  Robert Bright, DOB 07-30-1930, MRN [id number]  PCP:  No PCP Per Patient  Cardiologist:  Previous Robert Coco MD,  Now Robert Bright   Chief Complaint  Patient presents with  . Follow-up    hyperlipidemia      Notes from Dr. Jama Bright Robert Bright is a 82 y.o. male who presents for scheduled 6 month follow-up visit.  He is a retired IT trainer.[name] He has a history of essential hypertension and a history of hypercholesterolemia. He does not have any history of chest pain or ischemic heart disease. He had a normal nuclear stress test in 2004. He does have a past history of prostate cancer and a more recent history of bladder cancer. Dr. Alinda Bright is his urologist. Since last visit he has been feeling well .  When we last saw him he was [place]  However he states that the events to many doctors visits scheduled that they never got to the beach.  He is disappointed in that. From the cardiac standpoint he has not been experiencing chest pain or shortness of breath.  We reviewed his lab work which is satisfactory.  January 04, 2016:  Seen for the first time today .  Transfer from Dr. 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 Vertigo   . Hypertension     with a component of whitecoat syndrome  . Hyperlipidemia   . Hypercholesterolemia   . Bladder cancer [facility]) 1993    Being followed by Dr. Alinda Bright  . Prostate cancer ([place]) 1993    Hx ostatectomyof remote treated with radial pr  . Shingles   . ED (erectile dysfunction)   . Stress incontinence   . Ureteral stricture     Past Surgical History  Procedure  Laterality Date  . Prostatectomy  1993  . Cardiovascular stress test  01-02-2003    EF 68%  . Inguinal hernia repair    . Colonoscopy    . Cystostomy w/ bladder biopsy    . Cystoscopy    . Eye surgery    . Lymphadenectomy    . Bladder tumor excision       Current Outpatient Prescriptions  Medication Sig Dispense Refill  . amLODipine (NORVASC) 5 MG tablet Take 1 tablet (5 mg total) by mouth daily. 90 tablet 3  . aspirin 81 MG tablet Take 81 mg by mouth daily.      [name] atorvastatin (LIPITOR) 40 MG tablet Take 1 tablet (40 mg total) by mouth daily. 90 tablet 3  . brimonidine-timolol (COMBIGAN) 0.2-0.5 % ophthalmic solution Place 1 drop into both eyes every 12 (twelve) hours.    . cholecalciferol (VITAMIN D) 1000 units tablet Take 1,000 Units by mouth daily.    [name] doxycycline (VIBRAMYCIN) 100 MG capsule Take 100 mg by mouth 2 (two) times daily as needed (tick bites).    . ferrous sulfate 325 (65 FE) MG tablet Take 325 mg by mouth daily.      . hydrochlorothiazide (HYDRODIURIL) 25 MG tablet Take 1 tablet (25 mg total) by  mouth every other day. 30 tablet 11  . LORazepam (ATIVAN) 0.5 MG tablet Take 0.5 mg by mouth daily as needed for anxiety.    . moxifloxacin (VIGAMOX) 0.5 % ophthalmic solution One-two drops left eye q 2hrs till better 3 mL 1  . Multiple Vitamin (MULTIVITAMIN PO) Take 1 tablet by mouth daily.     . potassium chloride SA (K-DUR,KLOR-CON) 20 MEQ tablet TAKE 1 TABLET BY MOUTH EVERY DAY 90 tablet 0  . sildenafil (VIAGRA) 50 MG tablet Take 1 tablet (50 mg total) by mouth daily as needed for erectile dysfunction. 10 tablet 0  . valsartan (DIOVAN) 160 MG tablet Take 1 tablet (160 mg total) by mouth daily. 90 tablet 3  . [DISCONTINUED] metoprolol tartrate (LOPRESSOR) 25 MG tablet Take 25 mg by mouth as needed.       No current facility-administered medications for this visit.    Allergies:   Ramipril    Social History:  The patient  reports that he quit smoking about 29 years  ago. He has never used smokeless tobacco. He reports that he does not drink alcohol or use illicit drugs.   Family History:  The patient's family history includes Arthritis/Rheumatoid in his father; Dementia in his mother; Healthy in his sister and sister.    ROS:  Please see the history of present illness.   Otherwise, review of systems are positive for none.   All other systems are reviewed and negative.    PHYSICAL EXAM: VS:  BP 140/80 mmHg  Pulse 46  Ht 5\' 10"  (1.778 m)  Wt 170 lb 6.4 oz (77.293 kg)  BMI 24.45 kg/m2 , BMI Body mass index is 24.45 kg/(m^2). GEN: Well nourished, well developed, in no acute distress HEENT: normal Neck: no JVD, carotid bruits, or masses Cardiac: RRR; no murmurs, rubs, or gallops,no edema  Respiratory:  clear to auscultation bilaterally, normal work of breathing GI: soft, nontender, nondistended, + BS MS: no deformity or atrophy Skin: warm and dry, no rash Neuro:  Strength and sensation are intact Psych: euthymic mood, full affect   EKG:  EKG is ordered today.   Sinus brady at 46.   RBBB. No ST or T wave changes.     Recent Labs: 07/02/2015: ALT 19; BUN 25; Creat 1.63*; Potassium 4.1; Sodium 138    Lipid Panel    Component Value Date/Time   CHOL 138 07/02/2015 0857   TRIG 103 07/02/2015 0857   HDL 55 07/02/2015 0857   CHOLHDL 2.5 07/02/2015 0857   VLDL 21 07/02/2015 0857   LDLCALC 62 07/02/2015 0857      Wt Readings from Last 3 Encounters:  01/04/16 170 lb 6.4 oz (77.293 kg)  07/07/15 171 lb 12.8 oz (77.928 kg)  12/15/14 163 lb 1.9 oz (73.991 kg)        ASSESSMENT AND PLAN:  1. essential hypertension:    Stable  2. Hyperlipidemia -   Will check labs today  3. right bundle branch block asymptomatic  4. Sinus bradycardia:   Stable no symptoms    Current medicines are reviewed at length with the patient today.  The patient does not have concerns regarding medicines.  The following changes have been made:  no  change  Labs/ tests ordered today include:   No orders of the defined types were placed in this encounter.

## 2016-04-07 ENCOUNTER — Other Ambulatory Visit: Payer: Self-pay | Admitting: Urology

## 2016-04-19 ENCOUNTER — Encounter (HOSPITAL_COMMUNITY): Payer: Self-pay

## 2016-04-19 NOTE — Patient Instructions (Addendum)
Robert Bright  04/19/2016   Your procedure is scheduled on: 04/24/2016    Report to [facility]  Entrance take [facility]  elevators to 3rd floor to  [name] at     130pm  Call this number if you have problems the morning of surgery [phone]   Remember: ONLY 1 PERSON MAY GO WITH YOU TO SHORT STAY TO GET  READY MORNING OF YOUR SURGERY.  Do not eat food or drink liquids :After Midnight.     Take these medicines the morning of surgery with A SIP OF WATER: Amlodiopine ( Norvasc) , Ativan if needed                                 You may not have any metal on your body including hair pins and              piercings  Do not wear jewelry, , lotions, powders or perfumes, deodorant                        Men may shave face and neck.   Do not bring valuables to the hospital. Robert Bright.  Contacts, dentures or bridgework may not be worn into surgery.       Patients discharged the day of surgery will not be allowed to drive home.  Name and phone number of your driver:  Special Instructions: N/A              Please read over the following fact sheets you were given: _____________________________________________________________________             [facility] - Preparing for Surgery Before surgery, you can play an important role.  Because skin is not sterile, your skin needs to be as free of germs as possible.  You can reduce the number of germs on your skin by washing with CHG (chlorahexidine gluconate) soap before surgery.  CHG is an antiseptic cleaner which kills germs and bonds with the skin to continue killing germs even after washing. Please DO NOT use if you have an allergy to CHG or antibacterial soaps.  If your skin becomes reddened/irritated stop using the CHG and inform your nurse when you arrive at Short Stay. Do not shave (including legs and underarms) for at least 48 hours prior to the first  CHG shower.  You may shave your face/neck. Please follow these instructions carefully:  1.  Shower with CHG Soap the night before surgery and the  morning of Surgery.  2.  If you choose to wash your hair, wash your hair first as usual with your  normal  shampoo.  3.  After you shampoo, rinse your hair and body thoroughly to remove the  shampoo.                           4.  Use CHG as you would any other liquid soap.  You can apply chg directly  to the skin and wash                       Gently with a scrungie or clean washcloth.  5.  Apply the CHG Soap to your body ONLY FROM THE NECK DOWN.   Do not use on face/ open                           Wound or open sores. Avoid contact with eyes, ears mouth and genitals (private parts).                       Wash face,  Genitals (private parts) with your normal soap.             6.  Wash thoroughly, paying special attention to the area where your surgery  will be performed.  7.  Thoroughly rinse your body with warm water from the neck down.  8.  DO NOT shower/wash with your normal soap after using and rinsing off  the CHG Soap.                9.  Pat yourself dry with a clean towel.            10.  Wear clean pajamas.            11.  Place clean sheets on your bed the night of your first shower and do not  sleep with pets. Day of Surgery : Do not apply any lotions/deodorants the morning of surgery.  Please wear clean clothes to the hospital/surgery center.  FAILURE TO FOLLOW THESE INSTRUCTIONS MAY RESULT IN THE CANCELLATION OF YOUR SURGERY PATIENT SIGNATURE_________________________________  NURSE SIGNATURE__________________________________  ________________________________________________________________________    CLEAR LIQUID DIET   Foods Allowed                                                                     Foods Excluded  Coffee and tea, regular and decaf                             liquids that you cannot  Plain Jell-O in any flavor                                              see through such as: Fruit ices (not with fruit pulp)                                     milk, soups, orange juice  Iced Popsicles                                    All solid food Carbonated beverages, regular and diet                                    Cranberry, grape and apple juices Sports drinks like Gatorade Lightly seasoned clear broth or consume(fat free) Sugar, honey syrup  Sample Menu Breakfast  Lunch                                     Supper Cranberry juice                    Beef broth                            Chicken broth Jell-O                                     Grape juice                           Apple juice Coffee or tea                        Jell-O                                      Popsicle                                                Coffee or tea                        Coffee or tea  _____________________________________________________________________

## 2016-04-20 ENCOUNTER — Encounter (HOSPITAL_COMMUNITY): Payer: Self-pay

## 2016-04-20 ENCOUNTER — Encounter (HOSPITAL_COMMUNITY)
Admission: RE | Admit: 2016-04-20 | Discharge: 2016-04-20 | Disposition: A | Discharge status: Home / Self Care | Payer: Medicare Other | Source: Ambulatory Visit | Attending: Urology | Admitting: Urology

## 2016-04-20 DIAGNOSIS — Z01818 Encounter for other preprocedural examination: Secondary | ICD-10-CM | POA: Diagnosis present

## 2016-04-20 DIAGNOSIS — C679 Malignant neoplasm of bladder, unspecified: Secondary | ICD-10-CM | POA: Insufficient documentation

## 2016-04-20 HISTORY — DX: Cardiac murmur, unspecified: R01.1

## 2016-04-20 HISTORY — DX: Gastro-esophageal reflux disease without esophagitis: K21.9

## 2016-04-20 HISTORY — DX: Anxiety disorder, unspecified: F41.9

## 2016-04-20 LAB — CBC
HCT: 42.8 % (ref 39.0–52.0)
Hemoglobin: 14.2 g/dL (ref 13.0–17.0)
MCH: 29.8 pg (ref 26.0–34.0)
MCHC: 33.2 g/dL (ref 30.0–36.0)
MCV: 89.9 fL (ref 78.0–100.0)
PLATELETS: 217 10*3/uL (ref 150–400)
RBC: 4.76 MIL/uL (ref 4.22–5.81)
RDW: 13.8 % (ref 11.5–15.5)
WBC: 10.8 10*3/uL — AB (ref 4.0–10.5)

## 2016-04-20 LAB — BASIC METABOLIC PANEL
ANION GAP: 8 (ref 5–15)
BUN: 25 mg/dL — ABNORMAL HIGH (ref 6–20)
CALCIUM: 9.5 mg/dL (ref 8.9–10.3)
CO2: 24 mmol/L (ref 22–32)
Chloride: 104 mmol/L (ref 101–111)
Creatinine, Ser: 1.63 mg/dL — ABNORMAL HIGH (ref 0.61–1.24)
GFR calc Af Amer: 43 mL/min — ABNORMAL LOW (ref >60)
GFR calc non Af Amer: 37 mL/min — ABNORMAL LOW (ref >60)
GLUCOSE: 98 mg/dL (ref 65–99)
Potassium: 4.3 mmol/L (ref 3.5–5.1)
Sodium: 136 mmol/L (ref 135–145)

## 2016-04-20 NOTE — Progress Notes (Addendum)
EKG- 01/04/16- [facility]  LOV- 01/04/16- Cardiology- [facility]

## 2016-04-20 NOTE — Progress Notes (Signed)
BMP done 04/20/16 faxed via [facility] to Dr Alinda Money.

## 2016-04-21 NOTE — H&P (Signed)
Office Visit Report     04/05/2016   --------------------------------------------------------------------------------   Robert Bright  MRN: [id number]  PRIMARY CARE:  Marcello Moores A. Mare Ferrari, MD  DOB: 09-19-1930, 82 year old Male  REFERRING:    SSN: [id number]  PROVIDER:  Raynelle Bring, M.D.    LOCATION:  [facility] [phone]   --------------------------------------------------------------------------------   CC/HPI: Bladder cancer   He follows up today for surveillance of his low risk bladder cancer. He has a known tumor just beyond the right ureteral orifice that has been monitored. He denies any hematuria or change in his overall medical history since his last visit.     ALLERGIES: No Allergies    MEDICATIONS: Viagra 100 mg tablet 1 tablet PO prn as directed  Alphagan P 0.1 % Ophthalmic Solution Ophthalmic  Aspirin 81 MG TABS Oral  Combigan 0.2-0.5 % Ophthalmic Solution Ophthalmic  Daily Multiple Vitamins TABS Oral  Diovan 160 MG Oral Tablet Oral  HydroCHLOROthiazide 25 MG Oral Tablet Oral  Lipitor 40 MG Oral Tablet Oral  LORazepam 0.5 MG Oral Tablet Oral  Norvasc 5 MG Oral Tablet Oral  Vitamin D TABS Oral     GU PSH: Cysto Bladder Ureth Biopsy - 2009 Cysto Dilate Stricture (M or F) - 2010 Cystoscopy - 12/08/2015 Cystoscopy TURBT <2 cm - 2010 Radical Prostatectomy - 2008      PSH Notes: Eye Surgery, Cystoscopy For Urethral Stricture, Cystoscopy With Fulguration Small Lesion (5-32mm), Cystoscopy With Biopsy, Prostatect Retropubic Radical W/ Bilat Pelv Lymphadenectomy, Cystoscopy Bladder Tumor, Cystoscopy Bladder Tumor   NON-GU PSH: None   GU PMH: Bladder Cancer, overlapping sites - 12/08/2015, Bladder Cancer, overlapping sites, Cancer of overlapping sites of bladder - 07/28/2015 Prostate Cancer, History - 12/08/2015 Bladder-neck obstruction, Bladder neck contracture - 07/28/2015 ED, arterial insufficiency, Erectile dysfunction due to arterial  insufficiency - 07/28/2015 Prostate Cancer, Prostate cancer - 07/28/2015 Stress Incontinence, M/F, Male stress incontinence - 2014 Urethral Stricture, Unspec, Urethral stricture - 2014      PMH Notes:  1) Prostate cancer: He is status post a radical retropubic prostatectomy by Dr. Laurence Ferrari in 1993. His PSA has thus far been undetectable.   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   2003: Initial TURBT by Dr. Joelyn Oms  2005: Recurrence - TURBT by Dr. Joelyn Oms  April 2009: Recurrence - TURBT, Post-op [facility] -- Low grade Ta  July 2009: Recurrence - TURBT -- Low grade Ta  Feb 2010: Recurrence - TURBT -- Low grade Ta  Mar 2013: Recurrence with fulguration of two small papillary tumors  Jul 2013: Recurrence with fulguration of two small papillary tumors  Jul 2014: Recurrence with fulguration of one small papillary tumor  Jun 2015: Recurrence with fulguration of small papillary tumor   Last upper tract evaluation: Feb 2010 (RPGs - negative)   3) Bladder neck contracture / urethral stricture: He has had difficulty undergoing surveillance office cystoscopy as a result of a bladder neck contracture likely related to his prostatectomy. He underwent balloon dilation of his bladder neck at his TURBT in April 2009 which has since made office cystoscopy possible. He required a repeat dilation in February 2010. He has been able to tolerate dilation with filiform and followers in the office. He was noted to have a small membranous urethral stricture in March 2013 requiring dilation and intermittently since then.   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   5) Incontinence: He did develop symptoms of stress incontinence following dilation of his bladder neck in April 2009 which subsequently improved.     NON-GU PMH: Anxiety, Anxiety - 2014 Personal history of other diseases of  the circulatory system, History of hypertension - 2014 Personal history of other endocrine, nutritional and metabolic disease, History of hypercholesterolemia - 2014    FAMILY HISTORY: Breast Cancer - Daughter No pertinent family history - Other   SOCIAL HISTORY: Marital Status: Married Current Smoking Status: Patient has never smoked.  Does not drink anymore.  Does not use drugs. Drinks 2 caffeinated drinks per day.     Notes: Never A Smoker, Tobacco Use   REVIEW OF SYSTEMS:    GU Review Male:   Patient reports leakage of urine. Patient denies frequent urination, hard to postpone urination, burning/ pain with urination, get up at night to urinate, stream starts and stops, trouble starting your streams, and have to strain to urinate .  Gastrointestinal (Upper):   Patient denies nausea and vomiting.  Gastrointestinal (Lower):   Patient denies diarrhea and constipation.  Constitutional:   Patient denies fever, night sweats, weight loss, and fatigue.  Skin:   Patient denies skin rash/ lesion and itching.  Eyes:   Patient denies blurred vision and double vision.  Ears/ Nose/ Throat:   Patient denies sore throat and sinus problems.  Hematologic/Lymphatic:   Patient denies swollen glands and easy bruising.  Cardiovascular:   Patient denies leg swelling and chest pains.  Respiratory:   Patient denies cough and shortness of breath.  Endocrine:   Patient denies excessive thirst.  Musculoskeletal:   Patient denies back pain and joint pain.  Neurological:   Patient denies headaches and dizziness.  Psychologic:   Patient reports anxiety. Patient denies depression.   Notes: Patient reports that he did not sleep much last nigh, worrying about coming in today for his cysto.   VITAL SIGNS: None   GU PHYSICAL EXAMINATION:    Urethral Meatus: Normal size. No lesion, no wart, no polyp, no balanitis, no discharge. Normal location.    MULTI-SYSTEM PHYSICAL EXAMINATION:    Constitutional:  Well-nourished. No physical deformities. Normally developed. Good grooming.  Respiratory: No labored breathing, no use of accessory muscles.   Cardiovascular: Normal temperature, normal extremity pulses, no swelling, no varicosities.     PAST DATA REVIEWED:  Source Of History:  Patient   12/01/15 03/18/15 02/26/14 01/01/13 12/20/11 09/07/11 06/15/10 06/23/09  PSA  Total PSA 0.04  0.02  <0.01  <0.01  <0.01  0.16  0.00  <0.04     PROCEDURES:         Flexible Cystoscopy - 52000  Indication:  Risks, benefits, and potential complications of the procedure were discussed with the patient including infection, bleeding, voiding discomfort, urinary retention, fever, chills, sepsis, and others. All questions were answered. Informed consent was obtained. Sterile technique and intraurethral analgesia were used.  Meatus:  Normal size. Normal location. Normal condition.  Urethra:  No strictures.  External Sphincter:  Normal.  Verumontanum:  Verumontanum Surgically Absent.  Prostate:  Prostate Surgically Absent.  Bladder Neck:  Stable bladder neck contracture that was able to be bypassed with the flexible cystoscope.  Ureteral Orifices:  Normal location. Normal size. Normal shape. Effluxed clear urine.  Bladder:  He continues to have an area of low-grade appearing papillary tumor just beyond the right ureteral orifice. This is mildly progressed from his last office cystoscopy.  Chaperone: The procedure was well-tolerated and without complications. Instructions were given to call the office immediately if questions or problems.         Urinalysis - 81003 Dipstick Dipstick Cont'd  Specimen: Voided Bilirubin: Neg  Color: Yellow Ketones: Neg  Appearance: Clear Blood: Neg  Specific Gravity: 1.020 Protein: Trace  pH: 6.0 Urobilinogen: 0.2  Glucose: Neg Nitrites: Neg    Leukocyte Esterase: Neg    ASSESSMENT:      ICD-10 Details  1 GU:   Bladder Cancer, overlapping sites - C67.8    PLAN:            Orders Labs Urine Cytology          Schedule Return Visit: Other See Visit Notes             Note: Will call to schedule surgery          Document Letter(s):  Created for Patient: Clinical Summary         Notes:   1. Low risk bladder cancer: He does appear to have a slowly progressive low-grade bladder cancer recurrence. I am concerned that with continued monitoring this may become involved with his right ureteral orifice. I therefore recommended that we proceed with cystoscopy and transurethral resection at this time. A bladder washing has been obtained for cytology to ensure this is not a high-grade lesion. He will be scheduled for cystoscopy and transurethral resection as well as bilateral retrograde pyelography to assess his upper urinary tract. We have reviewed the potential risks, complications, and the expected recovery process associated with this procedure. He gives informed consent.   2. Prostate cancer: His next PSA would be due for June 2018.    E & M CODE: I spent at least 15 minutes face to face with the patient, more than 50% of that time was spent on counseling and/or coordinating care.     * Signed by Raynelle Bring, M.D. on 04/05/16 at 5:32 PM (EDT)*

## 2016-04-24 ENCOUNTER — Encounter (HOSPITAL_COMMUNITY)
Admission: RE | Disposition: A | Discharge status: Home / Self Care | Payer: Self-pay | Source: Ambulatory Visit | Attending: Urology

## 2016-04-24 ENCOUNTER — Ambulatory Visit (HOSPITAL_COMMUNITY): Payer: Medicare Other | Admitting: Certified Registered Nurse Anesthetist

## 2016-04-24 ENCOUNTER — Ambulatory Visit (HOSPITAL_COMMUNITY)
Admission: RE | Admit: 2016-04-24 | Discharge: 2016-04-24 | Disposition: A | Discharge status: Home / Self Care | Payer: Medicare Other | Source: Ambulatory Visit | Attending: Urology | Admitting: Urology

## 2016-04-24 ENCOUNTER — Encounter (HOSPITAL_COMMUNITY): Payer: Self-pay | Admitting: *Deleted

## 2016-04-24 DIAGNOSIS — K219 Gastro-esophageal reflux disease without esophagitis: Secondary | ICD-10-CM | POA: Diagnosis not present

## 2016-04-24 DIAGNOSIS — Z87891 Personal history of nicotine dependence: Secondary | ICD-10-CM | POA: Insufficient documentation

## 2016-04-24 DIAGNOSIS — E7800 Pure hypercholesterolemia, unspecified: Secondary | ICD-10-CM | POA: Insufficient documentation

## 2016-04-24 DIAGNOSIS — N529 Male erectile dysfunction, unspecified: Secondary | ICD-10-CM | POA: Insufficient documentation

## 2016-04-24 DIAGNOSIS — Z8546 Personal history of malignant neoplasm of prostate: Secondary | ICD-10-CM | POA: Insufficient documentation

## 2016-04-24 DIAGNOSIS — I10 Essential (primary) hypertension: Secondary | ICD-10-CM | POA: Insufficient documentation

## 2016-04-24 DIAGNOSIS — C679 Malignant neoplasm of bladder, unspecified: Secondary | ICD-10-CM | POA: Insufficient documentation

## 2016-04-24 DIAGNOSIS — Z7982 Long term (current) use of aspirin: Secondary | ICD-10-CM | POA: Diagnosis not present

## 2016-04-24 HISTORY — PX: TRANSURETHRAL RESECTION OF BLADDER TUMOR: SHX2575

## 2016-04-24 HISTORY — PX: CYSTOSCOPY W/ RETROGRADES: SHX1426

## 2016-04-24 SURGERY — TURBT (TRANSURETHRAL RESECTION OF BLADDER TUMOR)
Anesthesia: General | Site: Ureter

## 2016-04-24 MED ORDER — ONDANSETRON HCL 4 MG/2ML IJ SOLN
INTRAMUSCULAR | Status: DC | PRN
  Administered 2016-04-24: 4 mg via INTRAVENOUS

## 2016-04-24 MED ORDER — PROPOFOL 10 MG/ML IV BOLUS
INTRAVENOUS | Status: AC
  Filled 2016-04-24: qty 20

## 2016-04-24 MED ORDER — SODIUM CHLORIDE 0.9 % IV SOLN
INTRAVENOUS | Status: DC | PRN
  Administered 2016-04-24: 18 mL

## 2016-04-24 MED ORDER — EPHEDRINE SULFATE 50 MG/ML IJ SOLN
INTRAMUSCULAR | Status: DC | PRN
  Administered 2016-04-24 (×2): 10 mg via INTRAVENOUS

## 2016-04-24 MED ORDER — SODIUM CHLORIDE 0.9 % IR SOLN
Status: DC | PRN
  Administered 2016-04-24: 3000 mL

## 2016-04-24 MED ORDER — CEFAZOLIN SODIUM-DEXTROSE 2-4 GM/100ML-% IV SOLN
2.0000 g | INTRAVENOUS | Status: AC
  Administered 2016-04-24: 2 g via INTRAVENOUS
  Filled 2016-04-24: qty 100

## 2016-04-24 MED ORDER — MIDAZOLAM HCL 5 MG/5ML IJ SOLN
INTRAMUSCULAR | Status: DC | PRN
  Administered 2016-04-24: 1 mg via INTRAVENOUS

## 2016-04-24 MED ORDER — EPHEDRINE 5 MG/ML INJ
INTRAVENOUS | Status: AC
  Filled 2016-04-24: qty 10

## 2016-04-24 MED ORDER — CEFAZOLIN SODIUM-DEXTROSE 2-4 GM/100ML-% IV SOLN
INTRAVENOUS | Status: AC
  Filled 2016-04-24: qty 100

## 2016-04-24 MED ORDER — FENTANYL CITRATE (PF) 100 MCG/2ML IJ SOLN: 25.0000 ug | INTRAMUSCULAR | Status: DC | PRN

## 2016-04-24 MED ORDER — FENTANYL CITRATE (PF) 100 MCG/2ML IJ SOLN
INTRAMUSCULAR | Status: AC
  Filled 2016-04-24: qty 2

## 2016-04-24 MED ORDER — LIDOCAINE HCL (CARDIAC) 20 MG/ML IV SOLN
INTRAVENOUS | Status: DC | PRN
  Administered 2016-04-24: 40 mg via INTRAVENOUS

## 2016-04-24 MED ORDER — FENTANYL CITRATE (PF) 100 MCG/2ML IJ SOLN
INTRAMUSCULAR | Status: DC | PRN
Start: 2016-04-24 — End: 2016-04-24
  Administered 2016-04-24 (×2): 50 ug via INTRAVENOUS

## 2016-04-24 MED ORDER — LIDOCAINE 2% (20 MG/ML) 5 ML SYRINGE
INTRAMUSCULAR | Status: AC
  Filled 2016-04-24: qty 5

## 2016-04-24 MED ORDER — LACTATED RINGERS IV SOLN
INTRAVENOUS | Status: DC
  Administered 2016-04-24: 15:00:00 via INTRAVENOUS

## 2016-04-24 MED ORDER — MIDAZOLAM HCL 2 MG/2ML IJ SOLN
INTRAMUSCULAR | Status: AC
  Filled 2016-04-24: qty 2

## 2016-04-24 MED ORDER — PROPOFOL 10 MG/ML IV BOLUS
INTRAVENOUS | Status: DC | PRN
  Administered 2016-04-24: 120 mg via INTRAVENOUS

## 2016-04-24 MED ORDER — TRAMADOL HCL 50 MG PO TABS: 50.0000 mg | ORAL_TABLET | Freq: Four times a day (QID) | ORAL | 0 refills | Status: DC | PRN

## 2016-04-24 SURGICAL SUPPLY — 16 items
BAG URINE DRAINAGE (UROLOGICAL SUPPLIES) IMPLANT
BAG URO CATCHER STRL LF (MISCELLANEOUS) ×3 IMPLANT
CATH INTERMIT  6FR 70CM (CATHETERS) ×3 IMPLANT
CLOTH BEACON ORANGE TIMEOUT ST (SAFETY) ×3 IMPLANT
ELECT REM PT RETURN 9FT ADLT (ELECTROSURGICAL) ×3
ELECTRODE REM PT RTRN 9FT ADLT (ELECTROSURGICAL) ×2 IMPLANT
EVACUATOR MICROVAS BLADDER (UROLOGICAL SUPPLIES) IMPLANT
GLOVE BIOGEL M STRL SZ7.5 (GLOVE) ×3 IMPLANT
GOWN STRL REUS W/TWL LRG LVL3 (GOWN DISPOSABLE) ×6 IMPLANT
GUIDEWIRE STR DUAL SENSOR (WIRE) ×3 IMPLANT
LOOP CUT BIPOLAR 24F LRG (ELECTROSURGICAL) ×3 IMPLANT
MANIFOLD NEPTUNE II (INSTRUMENTS) ×3 IMPLANT
PACK CYSTO (CUSTOM PROCEDURE TRAY) ×3 IMPLANT
SET ASPIRATION TUBING (TUBING) IMPLANT
SYRINGE IRR TOOMEY STRL 70CC (SYRINGE) IMPLANT
TUBING CONNECTING 10 (TUBING) ×3 IMPLANT

## 2016-04-24 NOTE — Discharge Instructions (Signed)
1. You may see some blood in the urine and may have some burning with urination for 48-72 hours. You also may notice that you have to urinate more frequently or urgently after your procedure which is normal.  °2. You should call should you develop an inability urinate, fever > 101, persistent nausea and vomiting that prevents you from eating or drinking to stay hydrated.  °

## 2016-04-24 NOTE — Op Note (Signed)
Preoperative diagnosis: 1. Bladder tumor (1.5 cm)  Postoperative diagnosis:  1. Bladder tumor (1.5 cm)  Procedure:  1. Cystoscopy 2. Transurethral resection of bladder tumor (1.5 cm) 3. Bilateral retrograde pyelography with interpretation  Surgeon: Pryor Curia. M.D.  Anesthesia: General  Complications: None  Intraoperative findings:  1. Bladder tumor: There was a group of approximately 3 bladder tumors along with some frondular malignant-appearing tissue just to the right of the right ureteral orifice.  The largest tumor measured 1.5 cm.  There was a second site with a small 1.0 cm tumor toward the posterior midline of the bladder. 2. Retrograde pyelography: Bilateral retrograde pyelography was performed with Omnipaque contrast and a 6 French ureteral catheter.  This revealed no filling defects or other abnormalities in the renal collecting systems or ureters bilaterally.  EBL: Minimal  Specimens: 1. Bladder tumor  Disposition of specimens: Pathology  Indication: Robert Bright is a patient who has a history of bladder cancer and was found to have a bladder tumor. After reviewing the management options for treatment, he elected to proceed with the above surgical procedure(s). We have discussed the potential benefits and risks of the procedure, side effects of the proposed treatment, the likelihood of the patient achieving the goals of the procedure, and any potential problems that might occur during the procedure or recuperation. Informed consent has been obtained.  Description of procedure:  The patient was taken to the operating room and general anesthesia was induced.  The patient was placed in the dorsal lithotomy position, prepped and draped in the usual sterile fashion, and preoperative antibiotics were administered. A preoperative time-out was performed.   Cystourethroscopy was performed.  The patient's urethra was examined. He did have narrowing of his urethral  anastomosis although this was able to be bypassed with the rigid cystoscope without significant difficulty.  The prostate was surgically absent.   The bladder was then systematically examined in its entirety. There was noted to be a small area of 3 papillary tumors just to the right of the right ureteral orifice.  There was also some adjacent frondular tissue that appeared malignant.  This entire area measured approximately 3 cm with the largest individual tumor measuring 1.5 cm.  There also was a small 1 cm papillary tumor located at the posterior midline of the bladder.  No other abnormalities were noted.  Attention then turned to the right ureteral orifice and a ureteral catheter was used to intubate the ureteral orifice.  Omnipaque contrast was injected through the ureteral catheter and a retrograde pyelogram was performed with findings as dictated above.  Attention then turned to the left ureteral orifice and a ureteral catheter was used to intubate the ureteral orifice.  Omnipaque contrast was injected through the ureteral catheter and a retrograde pyelogram was performed with findings as dictated above.  The bladder was then re-examined after the resectoscope was placed.  Using loop cautery resection, the entire tumor was resected and removed for permanent pathologic analysis.   Hemostasis was then achieved with the loop cautery and the bladder was emptied and reinspected with no further bleeding noted at the end of the procedure.    The bladder was then emptied and the procedure ended.  The patient appeared to tolerate the procedure well and without complications.  The patient was able to be awakened and transferred to the recovery unit in satisfactory condition.    Pryor Curia MD

## 2016-04-24 NOTE — Anesthesia Preprocedure Evaluation (Signed)
Anesthesia Evaluation  Patient identified by MRN, date of birth, ID band Patient awake    Reviewed: Allergy & Precautions, NPO status , Patient's Chart, lab work & pertinent test results  History of Anesthesia Complications Negative for: history of anesthetic complications  Airway Mallampati: II  TM Distance: >3 FB Neck ROM: Full    Dental  (+) Dental Advisory Given, Chipped, Missing, Poor Dentition   Pulmonary former smoker (quit 1987),    breath sounds clear to auscultation       Cardiovascular hypertension, Pt. on medications (-) angina Rhythm:Regular Rate:Normal     Neuro/Psych Anxiety negative neurological ROS     GI/Hepatic Neg liver ROS, GERD  Controlled,  Endo/Other  negative endocrine ROS  Renal/GU Renal InsufficiencyRenal disease (creat 1.63)   Prostate cancer    Musculoskeletal   Abdominal   Peds  Hematology negative hematology ROS (+)   Anesthesia Other Findings   Reproductive/Obstetrics                             Anesthesia Physical Anesthesia Plan  ASA: III  Anesthesia Plan: General   Post-op Pain Management:    Induction: Intravenous  Airway Management Planned: LMA  Additional Equipment:   Intra-op Plan:   Post-operative Plan:   Informed Consent: I have reviewed the patients History and Physical, chart, labs and discussed the procedure including the risks, benefits and alternatives for the proposed anesthesia with the patient or authorized representative who has indicated his/her understanding and acceptance.   Dental advisory given  Plan Discussed with: CRNA and Surgeon  Anesthesia Plan Comments: (Plan routine monitors, GA- LMA OK)        Anesthesia Quick Evaluation

## 2016-04-24 NOTE — Anesthesia Postprocedure Evaluation (Signed)
Anesthesia Post Note  Patient: KYAN REPKE  Procedure(s) Performed: Procedure(s) (LRB): TRANSURETHRAL RESECTION OF BLADDER TUMOR (TURBT) (N/A) CYSTOSCOPY WITH BILATERAL RETROGRADE PYELOGRAM (Bilateral)  Patient location during evaluation: PACU Anesthesia Type: General Level of consciousness: awake and alert, oriented and patient cooperative Pain management: pain level controlled Vital Signs Assessment: post-procedure vital signs reviewed and stable Respiratory status: spontaneous breathing, nonlabored ventilation and respiratory function stable Cardiovascular status: blood pressure returned to baseline and stable Postop Assessment: no signs of nausea or vomiting Anesthetic complications: no    Last Vitals:  Vitals:   04/24/16 1643 04/24/16 1745  BP: 125/62 (!) 149/79  Pulse: 65 80  Resp: 16 18  Temp:      Last Pain:  Vitals:   04/24/16 1745  TempSrc:   PainSc: 0-No pain                 Serah Nicoletti,E. Cesar Rogerson

## 2016-04-24 NOTE — Anesthesia Procedure Notes (Signed)
Procedure Name: LMA Insertion Date/Time: 04/24/2016 3:29 PM Performed by: Lajuana Carry E Pre-anesthesia Checklist: Patient identified, Emergency Drugs available, Suction available and Patient being monitored Patient Re-evaluated:Patient Re-evaluated prior to inductionOxygen Delivery Method: Circle system utilized Preoxygenation: Pre-oxygenation with 100% oxygen Intubation Type: IV induction Ventilation: Mask ventilation without difficulty LMA: LMA inserted LMA Size: 4.0 Number of attempts: 1 Placement Confirmation: positive ETCO2 and breath sounds checked- equal and bilateral Dental Injury: Teeth and Oropharynx as per pre-operative assessment

## 2016-04-24 NOTE — Transfer of Care (Signed)
Immediate Anesthesia Transfer of Care Note  Patient: Robert Bright  Procedure(s) Performed: Procedure(s): TRANSURETHRAL RESECTION OF BLADDER TUMOR (TURBT) (N/A) CYSTOSCOPY WITH BILATERAL RETROGRADE PYELOGRAM (Bilateral)  Patient Location: PACU  Anesthesia Type:General  Level of Consciousness:  sedated, patient cooperative and responds to stimulation  Airway & Oxygen Therapy:Patient Spontanous Breathing and Patient connected to face mask oxgen  Post-op Assessment:  Report given to PACU RN and Post -op Vital signs reviewed and stable  Post vital signs:  Reviewed and stable  Last Vitals:  Vitals:   04/24/16 1331  BP: 136/67  Pulse: (!) 54  Resp: 18  Temp: [id number] C    Complications: No apparent anesthesia complications

## 2016-04-24 NOTE — Interval H&P Note (Signed)
History and Physical Interval Note:  04/24/2016 3:17 PM  Robert Bright  has presented today for surgery, with the diagnosis of BLADDER CANCER  The various methods of treatment have been discussed with the patient and family. After consideration of risks, benefits and other options for treatment, the patient has consented to  Procedure(s): TRANSURETHRAL RESECTION OF BLADDER TUMOR (TURBT) (N/A) CYSTOSCOPY WITH RETROGRADE PYELOGRAM (Bilateral) as a surgical intervention .  The patient's history has been reviewed, patient examined, no change in status, stable for surgery.  I have reviewed the patient's chart and labs.  Questions were answered to the patient's satisfaction.     Sheritha Louis,LES

## 2016-04-25 ENCOUNTER — Encounter (HOSPITAL_COMMUNITY): Payer: Self-pay | Admitting: Urology

## 2016-06-08 ENCOUNTER — Ambulatory Visit (INDEPENDENT_AMBULATORY_CARE_PROVIDER_SITE_OTHER): Payer: Medicare Other | Admitting: Internal Medicine

## 2016-06-08 ENCOUNTER — Encounter: Payer: Self-pay | Admitting: Internal Medicine

## 2016-06-08 VITALS — BP 138/78 | HR 53 | Temp 97.0°F | Ht 70.0 in | Wt 172.0 lb

## 2016-06-08 DIAGNOSIS — J069 Acute upper respiratory infection, unspecified: Secondary | ICD-10-CM

## 2016-06-08 MED ORDER — AZITHROMYCIN 250 MG PO TABS: ORAL_TABLET | ORAL | 0 refills | Status: DC

## 2016-06-08 MED ORDER — BENZONATATE 100 MG PO CAPS: 100.0000 mg | ORAL_CAPSULE | Freq: Three times a day (TID) | ORAL | 0 refills | Status: DC | PRN

## 2016-06-08 NOTE — Progress Notes (Signed)
   Subjective:    Patient ID: Robert Bright, male    DOB: 15-Aug-1930, 82 y.o.   MRN: [id number]  HPI Recently had TURP by [name] November 6 for in situ low-grade papillary urothelial carcinoma.  Wife was recently seen in urgent care with URI symptoms. She was treated with Keflex. He has come down with similar illness. Rapid has discolored sputum. No fever or shaking chills. No sore throat. He has had symptoms now for over a week.  Patient transferring from [name] who has retired.  He also has lesion bridge of right nose that needs dermatology attention.    Review of Systems see above     Objective:   Physical Exam  Skin warm and dry. Nodes none. Pharynx is clear. TMs are clear. Neck is supple. Chest clear to auscultation      Assessment & Plan:  Acute URI  Plan: Zithromax Z-PAK take 2 tablets day one followed by 1 tablet days 2 through 5. Tessalon Perles 100 mg 3 times daily as needed for cough

## 2016-06-08 NOTE — Patient Instructions (Signed)
Zithromax Z-PAK take 2 tablets day one followed by 1 tablet days 2 through 5. Tessalon Perles 100 mg 3 times daily as needed for cough

## 2016-06-26 ENCOUNTER — Other Ambulatory Visit: Payer: Medicare Other | Admitting: Internal Medicine

## 2016-06-26 DIAGNOSIS — E7800 Pure hypercholesterolemia, unspecified: Secondary | ICD-10-CM

## 2016-06-26 DIAGNOSIS — Z8546 Personal history of malignant neoplasm of prostate: Secondary | ICD-10-CM

## 2016-06-26 DIAGNOSIS — I119 Hypertensive heart disease without heart failure: Secondary | ICD-10-CM

## 2016-06-26 LAB — CBC WITH DIFFERENTIAL/PLATELET
BASOS PCT: 0 %
Basophils Absolute: 0 cells/uL (ref 0–200)
Eosinophils Absolute: 292 cells/uL (ref 15–500)
Eosinophils Relative: 4 %
HEMATOCRIT: 42.6 % (ref 38.5–50.0)
HEMOGLOBIN: 13.8 g/dL (ref 13.2–17.1)
LYMPHS ABS: 2117 {cells}/uL (ref 850–3900)
Lymphocytes Relative: 29 %
MCH: 29.6 pg (ref 27.0–33.0)
MCHC: 32.4 g/dL (ref 32.0–36.0)
MCV: 91.2 fL (ref 80.0–100.0)
MONO ABS: 803 {cells}/uL (ref 200–950)
MPV: 11 fL (ref 7.5–12.5)
Monocytes Relative: 11 %
Neutro Abs: 4088 cells/uL (ref 1500–7800)
Neutrophils Relative %: 56 %
Platelets: 227 10*3/uL (ref 140–400)
RBC: 4.67 MIL/uL (ref 4.20–5.80)
RDW: 14.5 % (ref 11.0–15.0)
WBC: 7.3 10*3/uL (ref 3.8–10.8)

## 2016-06-26 LAB — COMPLETE METABOLIC PANEL WITH GFR
ALBUMIN: 4 g/dL (ref 3.6–5.1)
ALK PHOS: 59 U/L (ref 40–115)
ALT: 22 U/L (ref 9–46)
AST: 22 U/L (ref 10–35)
BUN: 32 mg/dL — ABNORMAL HIGH (ref 7–25)
CALCIUM: 10.1 mg/dL (ref 8.6–10.3)
CHLORIDE: 105 mmol/L (ref 98–110)
CO2: 26 mmol/L (ref 20–31)
CREATININE: 1.73 mg/dL — AB (ref 0.70–1.11)
GFR, EST AFRICAN AMERICAN: 41 mL/min — AB (ref >=60)
GFR, Est Non African American: 36 mL/min — ABNORMAL LOW (ref >=60)
Glucose, Bld: 87 mg/dL (ref 65–99)
Potassium: 4.6 mmol/L (ref 3.5–5.3)
Sodium: 142 mmol/L (ref 135–146)
Total Bilirubin: 0.7 mg/dL (ref 0.2–1.2)
Total Protein: 7.5 g/dL (ref 6.1–8.1)

## 2016-06-26 LAB — LIPID PANEL
CHOL/HDL RATIO: 2.4 ratio (ref <5.0)
Cholesterol: 166 mg/dL (ref <200)
HDL: 68 mg/dL (ref >40)
LDL Cholesterol: 73 mg/dL (ref <100)
TRIGLYCERIDES: 127 mg/dL (ref <150)
VLDL: 25 mg/dL (ref <30)

## 2016-06-26 LAB — PSA

## 2016-06-29 ENCOUNTER — Ambulatory Visit (INDEPENDENT_AMBULATORY_CARE_PROVIDER_SITE_OTHER): Payer: Medicare Other | Admitting: Internal Medicine

## 2016-06-29 ENCOUNTER — Telehealth: Payer: Self-pay | Admitting: Internal Medicine

## 2016-06-29 ENCOUNTER — Encounter: Payer: Self-pay | Admitting: Internal Medicine

## 2016-06-29 VITALS — BP 140/62 | HR 61 | Temp 97.0°F | Ht 70.0 in | Wt 175.0 lb

## 2016-06-29 DIAGNOSIS — I10 Essential (primary) hypertension: Secondary | ICD-10-CM

## 2016-06-29 DIAGNOSIS — Z0000 Encounter for general adult medical examination without abnormal findings: Secondary | ICD-10-CM

## 2016-06-29 DIAGNOSIS — Z8546 Personal history of malignant neoplasm of prostate: Secondary | ICD-10-CM | POA: Diagnosis not present

## 2016-06-29 DIAGNOSIS — I4510 Unspecified right bundle-branch block: Secondary | ICD-10-CM | POA: Diagnosis not present

## 2016-06-29 DIAGNOSIS — Z8551 Personal history of malignant neoplasm of bladder: Secondary | ICD-10-CM

## 2016-06-29 DIAGNOSIS — I119 Hypertensive heart disease without heart failure: Secondary | ICD-10-CM

## 2016-06-29 LAB — POCT URINALYSIS DIPSTICK
BILIRUBIN UA: NEGATIVE
Blood, UA: NEGATIVE
Glucose, UA: NEGATIVE
KETONES UA: NEGATIVE
LEUKOCYTES UA: NEGATIVE
Nitrite, UA: NEGATIVE
PROTEIN UA: NEGATIVE
Spec Grav, UA: 1.015
Urobilinogen, UA: NEGATIVE
pH, UA: 6.5

## 2016-06-29 MED ORDER — LORAZEPAM 0.5 MG PO TABS: 0.5000 mg | ORAL_TABLET | Freq: Every day | ORAL | 0 refills | Status: DC | PRN

## 2016-06-29 MED ORDER — AZITHROMYCIN 250 MG PO TABS: ORAL_TABLET | ORAL | 0 refills | Status: DC

## 2016-06-29 NOTE — Telephone Encounter (Signed)
Called [facility] to get updated immunization records.  They say the only hold the last 18 months of records and do not have anything on file.  They suggested [facility].  They tried looking it up but the site was not working at that time.

## 2016-06-29 NOTE — Progress Notes (Signed)
Subjective:    Patient ID: Robert Bright, male    DOB: 1930/09/23, 83 y.o.   MRN: [id number]  HPI  83 year old White Male formerly seen by [name] who has retired. Patient presents today for health maintenance exam as a new patient. I used to see him many years ago infrequently when I had an office in [employer] here in [place]. 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 He presented for evaluation and was found to have prostate cancer in 1993. Was treated successfully by Urology. Around 2003, was  diagnosed with cancer of the bladder wall and is followed by Dr. [name]. He had a biopsy in November 2017 during a TURP showing in situ noninvasive low-grade papillary urothelial carcinoma. Detrusor muscle not involved.  Has a history of hypertension. He has a history of hyperlipidemia maintained on statin therapy.He has a history of right bundle branch block.  Recently had carcinoma removed from nose by dermatologist  Was treated for Herpes zoster affecting his left face orbit and scalp by Dr. Elder Cyphers in 2014.  Social history: He is married. Wife has dementia. Wife used to be a Pharmacist, hospital. 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 He has 4 year college degree. Does not smoke or consume alcohol. Daughter, [name] Mood lives out of state and her phone number is [phone]. One son and 2 daughters.  Family history: Father died at age 38 with complications of rheumatoid arthritis. Mother died at age 37 from complications of surgery. 2 sisters ages 30 and 90 in good health. Mother had dementia.  He will be followed by Dr. Acie Fredrickson now that [name] has retired  Dr. Carlean Purl has seen him regarding GE reflux in 2014. Was treated with lansoprazole. At that time was drinking 6 cups of coffee daily.  He is on Lipitor, HCTZ 25 mg daily, Ativan 0.5 mg as needed for anxiety, potassium 20 mEq daily, Diovan 160 mg daily, Viagra when  necessary, amlodipine 5 mg daily  Intolerant of Ramapril.    Review of Systems tinnitus, ED, some urinary incontinence, musculoskeletal pain occasional constipation     Objective:   Physical Exam  Constitutional: He is oriented to person, place, and time. He appears well-developed and well-nourished.  HENT:  Head: Normocephalic and atraumatic.  Right Ear: External ear normal.  Left Ear: External ear normal.  Eyes: Right eye exhibits no discharge. Left eye exhibits no discharge. No scleral icterus.  Neck: Neck supple. No JVD present. No thyromegaly present.  Cardiovascular: Normal rate, regular rhythm, normal heart sounds and intact distal pulses.   No murmur heard. Pulmonary/Chest: Effort normal and breath sounds normal. No respiratory distress. He has no wheezes. He has no rales. He exhibits no tenderness.  Abdominal: He exhibits no distension and no mass. There is no tenderness. There is no rebound and no guarding.  Genitourinary:  Genitourinary Comments: Deferred to urology  Musculoskeletal: He exhibits no edema.  Lymphadenopathy:    He has no cervical adenopathy.  Neurological: He is alert and oriented to person, place, and time. He has normal reflexes. He displays normal reflexes. No cranial nerve deficit. He exhibits normal muscle tone. Coordination normal.  Skin: No rash noted.  Psychiatric: He has a normal mood and affect. His behavior is normal. Judgment and thought content normal.  Seems mildly forgetful  Vitals reviewed.         Assessment & Plan:  Essential hypertension  Hyperlipidemia  History of bladder cancer  History of prostate cancer in 1993  Situational stress with wife-she has dementia  Memory loss -I  think he has some very mild dementia at this point however, he does very well. He continues to drive. He was able to complete his medical paperwork without much difficulty. 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 He and his wife reside alone.  He  will return in 6-12 months or as needed.  Subjective:   Patient presents for Medicare Annual/Subsequent preventive examination.  Review Past Medical/Family/Social:See above   Risk Factors  Current exercise habits: Stays physically active around his home Dietary issues discussed: Low fat low carbohydrate discussed  Cardiac risk factors:Hyperlipidemia and hypertension  Depression Screen  (Note: if answer to either of the following is "Yes", a more complete depression screening is indicated)   Over the past two weeks, have you felt down, depressed or hopeless? No  Over the past two weeks, have you felt little interest or pleasure in doing things? No Have you lost interest or pleasure in daily life? No Do you often feel hopeless? No Do you cry easily over simple problems? No   Activities of Daily Living  In your present state of health, do you have any difficulty performing the following activities?:   Driving? No  Managing money? No  Feeding yourself? No  Getting from bed to chair? No  Climbing a flight of stairs? No  Preparing food and eating?: No  Bathing or showering? No  Getting dressed: No  Getting to the toilet? No  Using the toilet:No  Moving around from place to place: No  In the past year have you fallen or had a near fall?:No  Are you sexually active? No  Do you have more than one partner? No   Hearing Difficulties: Yes Do you often ask people to speak up or repeat themselves? Yes Do you experience ringing or noises in your ears? No  Do you have difficulty understanding soft or whispered voices? No  Do you feel that you have a problem with memory? No Do you often misplace items? No    Home Safety:  Do you have a smoke alarm at your residence? Yes Do you have grab bars in the bathroom?Yes Do you have throw rugs in your house? No   Cognitive Testing  Alert? Yes Normal Appearance?Yes  Oriented to person? Yes Place? Yes  Time? Yes  Recall of three  objects? Yes  Can perform simple calculations? Yes  Displays appropriate judgment?Yes  Can read the correct time from a watch face?Yes   List the Names of Other Physician/Practitioners you currently use:  See referral list for the physicians patient is currently seeing.  Dr. Acie Fredrickson [name]   Review of Systems: See above   Objective:     General appearance: Appears stated age. Head: Normocephalic, without obvious abnormality, atraumatic  Eyes: conj clear, EOMi PEERLA  Ears: normal TM's and external ear canals both ears  Nose: Nares normal. Septum midline. Mucosa normal. No drainage or sinus tenderness.  Throat: lips, mucosa, and tongue normal; teeth and gums normal  Neck: no adenopathy, no carotid bruit, no JVD, supple, symmetrical, trachea midline and thyroid not enlarged, symmetric, no tenderness/mass/nodules  No CVA tenderness.  Lungs: clear to auscultation bilaterally  Breasts: normal appearance, no masses or tenderness, top of the pacemaker on left upper chest. Heart: regular rate and rhythm, S1, S2 normal, no murmur, click, rub or gallop  Abdomen: soft, non-tender; bowel  sounds normal; no masses, no organomegaly  Musculoskeletal: ROM normal in all joints, no crepitus, no deformity, Normal muscle strengthen. Back  is symmetric, no curvature. Skin: Skin color, texture, turgor normal. No rashes or lesions  Lymph nodes: Cervical, supraclavicular, and axillary nodes normal.  Neurologic: CN 2 -12 Normal, Normal symmetric reflexes. Normal coordination and gait  Psych: Alert & Oriented x 3, Mood appear stable.    Assessment:    Annual wellness medicare exam   Plan:    During the course of the visit the patient was educated and counseled about appropriate screening and preventive services including:   Recommend annual flu vaccine  Records indicate he likely had pneumococcal 48 in 1999 at the age of 40.Dr. Sherryl Barters notes indicate he was given prescription for Prevnar 13  in 2015.     Patient Instructions (the written plan) was given to the patient.  Medicare Attestation  I have personally reviewed:  The patient's medical and social history  Their use of alcohol, tobacco or illicit drugs  Their current medications and supplements  The patient's functional ability including ADLs,fall risks, home safety risks, cognitive, and hearing and visual impairment  Diet and physical activities  Evidence for depression or mood disorders  The patient's weight, height, BMI, and visual acuity have been recorded in the chart. I have made referrals, counseling, and provided education to the patient based on review of the above and I have provided the patient with a written personalized care plan for preventive services.

## 2016-07-05 ENCOUNTER — Ambulatory Visit: Payer: Medicare Other | Admitting: Cardiovascular Disease

## 2016-07-19 ENCOUNTER — Encounter: Payer: Self-pay | Admitting: Cardiovascular Disease

## 2016-07-19 ENCOUNTER — Ambulatory Visit (INDEPENDENT_AMBULATORY_CARE_PROVIDER_SITE_OTHER): Payer: Medicare Other | Admitting: Cardiovascular Disease

## 2016-07-19 VITALS — BP 130/62 | HR 50 | Ht 70.0 in | Wt 175.0 lb

## 2016-07-19 DIAGNOSIS — I119 Hypertensive heart disease without heart failure: Secondary | ICD-10-CM

## 2016-07-19 DIAGNOSIS — E7800 Pure hypercholesterolemia, unspecified: Secondary | ICD-10-CM | POA: Diagnosis not present

## 2016-07-19 NOTE — Patient Instructions (Signed)
Continue same medications as previously prescribed. Was pleasure to see you today. Return in 6 months.

## 2016-07-19 NOTE — Progress Notes (Signed)
Cardiology Office Note   Date:  07/19/2016   ID:  Robert Bright, DOB 07/11/1930, MRN [id number]  PCP:  Robert Showers, MD  Cardiologist:  Previous Robert Coco MD,  Now Robert Bright   Chief Complaint  Patient presents with  . Hypertension      Notes from Dr. Liborio Bright is a 83 y.o. male who presents for scheduled 6 month follow-up visit.  He is a retired IT trainer.[name] He has a history of essential hypertension and a history of hypercholesterolemia. He does not have any history of chest pain or ischemic heart disease. He had a normal nuclear stress test in 2004. He does have a past history of prostate cancer and a more recent history of bladder cancer. Dr. Alinda Bright is his urologist. Since last visit he has been feeling well .  When we last saw him [place]  However he states that the events to many doctors visits scheduled that they never got to the beach.  He is disappointed in that. From the cardiac standpoint he has not been experiencing chest pain or shortness of breath.  We reviewed his lab work which is satisfactory.  January 04, 2016:  Seen for the first time today .  Transfer from Dr. 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 31, 2018:   Robert Bright is seen back today for his HTN and hyperlipidemia .    Had a skin cancer removed from his nose .  Also has had a bladder cancer removed.    Past Medical History:  Diagnosis Date  . Anxiety   . Bladder cancer [facility]) 1993   Being followed by Dr. Alinda Bright  . ED (erectile dysfunction)   . GERD (gastroesophageal reflux disease)   . Heart murmur   . Hypercholesterolemia   . Hyperlipidemia   . Hypertension    with a component of  whitecoat syndrome  . Prostate cancer ([place]) 1993   Hx ostatectomyof remote treated with radial pr  . Shingles   . Stress incontinence   . Ureteral stricture   . Vertigo     Past Surgical History:  Procedure Laterality Date  . BLADDER TUMOR EXCISION    . CARDIOVASCULAR STRESS TEST  01-02-2003   EF 68%  . COLONOSCOPY    . CYSTOSCOPY    . CYSTOSCOPY W/ RETROGRADES Bilateral 04/24/2016   Procedure: CYSTOSCOPY WITH BILATERAL RETROGRADE PYELOGRAM;  Surgeon: Robert Bring, MD;  Location: [facility];  Service: Urology;  Laterality: Bilateral;  . CYSTOSTOMY W/ BLADDER BIOPSY    . EYE SURGERY    . INGUINAL HERNIA REPAIR    . LYMPHADENECTOMY    . PROSTATECTOMY  1993  . TRANSURETHRAL RESECTION OF BLADDER TUMOR N/A 04/24/2016   Procedure: TRANSURETHRAL RESECTION OF BLADDER TUMOR (TURBT);  Surgeon: Robert Bring, MD;  Location: [facility];  Service: Urology;  Laterality: N/A;     Current Outpatient Prescriptions  Medication Sig Dispense Refill  . amLODipine (NORVASC) 5 MG tablet Take 1 tablet (5 mg total) by mouth daily. 90 tablet 3  . atorvastatin (LIPITOR) 40 MG tablet Take 1 tablet (40 mg total) by mouth daily. 90 tablet 3  .  hydrochlorothiazide (HYDRODIURIL) 25 MG tablet Take 1 tablet (25 mg total) by mouth every other day. 90 tablet 3  . LORazepam (ATIVAN) 0.5 MG tablet Take 1 tablet (0.5 mg total) by mouth daily as needed for anxiety. 30 tablet 0  . potassium chloride SA (K-DUR,KLOR-CON) 20 MEQ tablet Take 1 tablet (20 mEq total) by mouth daily. 90 tablet 3  . valsartan (DIOVAN) 160 MG tablet Take 1 tablet (160 mg total) by mouth daily. 90 tablet 3  . VIAGRA 50 MG tablet Take 1 tablet (50 mg total) by mouth daily as needed for erectile dysfunction. 10 tablet 1  . benzonatate (TESSALON) 100 MG capsule Take 1 capsule (100 mg total) by mouth 3 (three) times daily as needed for cough. (Patient not taking: Reported on 07/19/2016) 60 capsule 0   No current facility-administered medications for this  visit.     Allergies:   Ramipril    Social History:  The patient  reports that he quit smoking about 30 years ago. He has never used smokeless tobacco. He reports that he does not drink alcohol or use drugs.   Family History:  The patient's family history includes Arthritis/Rheumatoid in his father; Dementia in his mother; Healthy in his sister and sister.    ROS:  Please see the history of present illness.   Otherwise, review of systems are positive for none.   All other systems are reviewed and negative.    PHYSICAL EXAM: VS:  BP 130/62   Pulse (!) 50   Ht 5\' 10"  (1.778 m)   Wt 175 lb (79.4 kg)   BMI 25.11 kg/m  , BMI Body mass index is 25.11 kg/m. GEN: Well nourished, well developed, in no acute distress  HEENT: normal  Neck: no JVD, carotid bruits, or masses Cardiac: RRR; no murmurs, rubs, or gallops,no edema  Respiratory:  clear to auscultation bilaterally, normal work of breathing GI: soft, nontender, nondistended, + BS MS: no deformity or atrophy  Skin: warm and dry, no rash Neuro:  Strength and sensation are intact Psych: euthymic mood, full affect   EKG:  EKG is ordered today.   Sinus brady at 46.   RBBB. No ST or T wave changes.     Recent Labs: 06/26/2016: ALT 22; BUN 32; Creat 1.73; Hemoglobin 13.8; Platelets 227; Potassium 4.6; Sodium 142    Lipid Panel    Component Value Date/Time   CHOL 166 06/26/2016 1113   TRIG 127 06/26/2016 1113   HDL 68 06/26/2016 1113   CHOLHDL 2.4 06/26/2016 1113   VLDL 25 06/26/2016 1113   LDLCALC 73 06/26/2016 1113      Wt Readings from Last 3 Encounters:  07/19/16 175 lb (79.4 kg)  06/29/16 175 lb (79.4 kg)  06/08/16 172 lb (78 kg)        ASSESSMENT AND PLAN:  1. essential hypertension:    Stable , continue current meds.  2. Hyperlipidemia -   Labs look great   Will check again in 1 year.   3. right bundle branch block asymptomatic  4. Sinus bradycardia:   Stable no symptoms    Current medicines are  reviewed at length with the patient today.  The patient does not have concerns regarding medicines.  The following changes have been made:  no change  Labs/ tests ordered today include:   No orders of the defined types were placed in this encounter.    Mertie Moores, MD  07/19/2016 8:43 AM    [facility] [address]  [place], [place]  [address] Pager [phone] Phone: [phone]; Fax: [phone]

## 2016-07-19 NOTE — Patient Instructions (Signed)
Medication Instructions:  Your physician recommends that you continue on your current medications as directed. Please refer to the Current Medication list given to you today.   Labwork: Your physician recommends that you return for a FASTING lipid profile, bmet, lft in 1 year  Testing/Procedures: None ordered  Follow-Up: Your physician wants you to follow-up in: 1 year with Dr.Nahser You will receive a reminder letter in the mail two months in advance. If you don't receive a letter, please call our office to schedule the follow-up appointment.   Any Other Special Instructions Will Be Listed Below (If Applicable).     If you need a refill on your cardiac medications before your next appointment, please call your pharmacy.

## 2016-09-04 ENCOUNTER — Encounter: Payer: Self-pay | Admitting: Internal Medicine

## 2016-09-04 ENCOUNTER — Ambulatory Visit (INDEPENDENT_AMBULATORY_CARE_PROVIDER_SITE_OTHER): Payer: Medicare Other | Admitting: Internal Medicine

## 2016-09-04 VITALS — BP 120/70 | HR 52 | Temp 97.0°F | Ht 70.0 in | Wt 178.0 lb

## 2016-09-04 DIAGNOSIS — M25572 Pain in left ankle and joints of left foot: Secondary | ICD-10-CM

## 2016-09-04 DIAGNOSIS — R609 Edema, unspecified: Secondary | ICD-10-CM

## 2016-09-04 DIAGNOSIS — I10 Essential (primary) hypertension: Secondary | ICD-10-CM

## 2016-09-04 NOTE — Progress Notes (Signed)
   Subjective:    Patient ID: Robert Bright, male    DOB: 09-Apr-1931, 83 y.o.   MRN: [id number]  HPI 83 year old Male with history of essential hypertension and dependent edema in today complaining of slight sharp sensation intermittent right ankle recently. Noticed it at night. Doesn't recall any injury. Has noticed some tenderness in left ankle. While we were sitting there he knowledge is he has worsening of dependent edema. He has been taking diuretic every other day instead of daily.    Review of Systems see above     Objective:   Physical Exam He has trace pitting edema of the lower extremities. No obvious deformity of either ankle. Has some tenderness left lateral ankle which I suspect is musculoskeletal in nature such as a mild strain or sprain       Assessment & Plan:  Left ankle pain-possible strain or sprrain  Dependent edema  Plan: Increase diuretic to daily instead of every other day. May take Tylenol for pain and left ankle and apply ice to it 20 minutes twice daily. Will not x-ray at this time.

## 2016-09-04 NOTE — Patient Instructions (Signed)
Increase diuretic to every day instead of every other day. Reassure tingling/sharp pain right ankle is probably insignificant. May take Tylenol for pain and left ankle.

## 2016-12-23 ENCOUNTER — Other Ambulatory Visit: Payer: Self-pay | Admitting: Cardiovascular Disease

## 2017-01-09 ENCOUNTER — Other Ambulatory Visit: Payer: Self-pay | Admitting: Cardiovascular Disease

## 2017-01-10 ENCOUNTER — Other Ambulatory Visit: Payer: Self-pay | Admitting: Internal Medicine

## 2017-01-11 NOTE — Telephone Encounter (Signed)
Refill #[id number] with no refill

## 2017-03-19 ENCOUNTER — Other Ambulatory Visit: Payer: Self-pay | Admitting: Cardiovascular Disease

## 2017-03-19 DIAGNOSIS — I119 Hypertensive heart disease without heart failure: Secondary | ICD-10-CM

## 2017-04-03 ENCOUNTER — Telehealth: Payer: Self-pay | Admitting: Internal Medicine

## 2017-04-03 DIAGNOSIS — H6502 Acute serous otitis media, left ear: Secondary | ICD-10-CM

## 2017-04-03 MED ORDER — NEOMYCIN-POLYMYXIN-HC 3.5-10000-1 OT SOLN: 4.0000 [drp] | Freq: Four times a day (QID) | OTIC | 0 refills | Status: DC

## 2017-04-03 NOTE — Telephone Encounter (Signed)
Wants to know if he can have a Rx for swimmers ear?  He has been hurting in the left ear x 1 day.  The right ear is fine, no pain.  He can hear fine out of the ear, it just hurts.  No fever.  No other complaints.    Pharmacy:  [facility] on [address] # for contact:  [phone]  Thank you.

## 2017-04-03 NOTE — Telephone Encounter (Signed)
Call in Cortisporin Otic Suspension 4 drops left ear 4 times a day x 5 days

## 2017-04-03 NOTE — Telephone Encounter (Signed)
Pt is aware.  

## 2017-07-03 ENCOUNTER — Ambulatory Visit: Payer: Medicare Other | Admitting: Cardiovascular Disease

## 2017-07-04 ENCOUNTER — Other Ambulatory Visit: Payer: Self-pay | Admitting: Cardiovascular Disease

## 2017-09-18 ENCOUNTER — Ambulatory Visit: Payer: Medicare Other | Admitting: Cardiovascular Disease

## 2017-10-12 ENCOUNTER — Other Ambulatory Visit: Payer: Self-pay

## 2017-10-12 DIAGNOSIS — I119 Hypertensive heart disease without heart failure: Secondary | ICD-10-CM

## 2017-10-12 MED ORDER — HYDROCHLOROTHIAZIDE 25 MG PO TABS: 25.0000 mg | ORAL_TABLET | ORAL | 0 refills | Status: DC

## 2017-10-12 MED ORDER — NORVASC 5 MG PO TABS: 5.0000 mg | ORAL_TABLET | Freq: Every day | ORAL | 0 refills | Status: DC

## 2017-10-12 MED ORDER — LIPITOR 40 MG PO TABS: 40.0000 mg | ORAL_TABLET | Freq: Every day | ORAL | 0 refills | Status: DC

## 2017-10-12 MED ORDER — VALSARTAN 160 MG PO TABS: 160.0000 mg | ORAL_TABLET | Freq: Every day | ORAL | 0 refills | Status: DC

## 2017-10-17 ENCOUNTER — Encounter (INDEPENDENT_AMBULATORY_CARE_PROVIDER_SITE_OTHER): Payer: Self-pay

## 2017-10-17 ENCOUNTER — Ambulatory Visit: Payer: Medicare Other | Admitting: Physician Assistant

## 2017-10-17 ENCOUNTER — Encounter: Payer: Self-pay | Admitting: Physician Assistant

## 2017-10-17 VITALS — BP 144/74 | HR 58 | Ht 70.0 in | Wt 177.0 lb

## 2017-10-17 DIAGNOSIS — Z79899 Other long term (current) drug therapy: Secondary | ICD-10-CM | POA: Diagnosis not present

## 2017-10-17 DIAGNOSIS — I119 Hypertensive heart disease without heart failure: Secondary | ICD-10-CM | POA: Diagnosis not present

## 2017-10-17 DIAGNOSIS — E7800 Pure hypercholesterolemia, unspecified: Secondary | ICD-10-CM | POA: Diagnosis not present

## 2017-10-17 DIAGNOSIS — R252 Cramp and spasm: Secondary | ICD-10-CM

## 2017-10-17 DIAGNOSIS — R001 Bradycardia, unspecified: Secondary | ICD-10-CM

## 2017-10-17 LAB — COMPREHENSIVE METABOLIC PANEL
ALT: 13 IU/L (ref 0–44)
AST: 20 IU/L (ref 0–40)
Albumin/Globulin Ratio: 1.7 (ref 1.2–2.2)
Albumin: 4.5 g/dL (ref 3.5–4.7)
Alkaline Phosphatase: 66 IU/L (ref 39–117)
BUN/Creatinine Ratio: 18 (ref 10–24)
BUN: 17 mg/dL (ref 8–27)
Bilirubin Total: 0.6 mg/dL (ref 0.0–1.2)
CO2: 24 mmol/L (ref 20–29)
CREATININE: 0.97 mg/dL (ref 0.76–1.27)
Calcium: 9.7 mg/dL (ref 8.6–10.2)
Chloride: 101 mmol/L (ref 96–106)
GFR calc Af Amer: 82 mL/min/{1.73_m2} (ref >59)
GFR, EST NON AFRICAN AMERICAN: 71 mL/min/{1.73_m2} (ref >59)
Globulin, Total: 2.6 g/dL (ref 1.5–4.5)
Glucose: 85 mg/dL (ref 65–99)
Potassium: 4.3 mmol/L (ref 3.5–5.2)
Sodium: 139 mmol/L (ref 134–144)
Total Protein: 7.1 g/dL (ref 6.0–8.5)

## 2017-10-17 LAB — MAGNESIUM: Magnesium: 2.3 mg/dL (ref 1.6–2.3)

## 2017-10-17 LAB — LIPID PANEL
CHOL/HDL RATIO: 2.7 ratio (ref 0.0–5.0)
Cholesterol, Total: 255 mg/dL — ABNORMAL HIGH (ref 100–199)
HDL: 96 mg/dL (ref >39)
LDL CALC: 140 mg/dL — AB (ref 0–99)
TRIGLYCERIDES: 96 mg/dL (ref 0–149)
VLDL CHOLESTEROL CAL: 19 mg/dL (ref 5–40)

## 2017-10-17 MED ORDER — VALSARTAN 160 MG PO TABS: 160.0000 mg | ORAL_TABLET | Freq: Every day | ORAL | 3 refills | Status: DC

## 2017-10-17 MED ORDER — POTASSIUM CHLORIDE CRYS ER 20 MEQ PO TBCR: 20.0000 meq | EXTENDED_RELEASE_TABLET | Freq: Every day | ORAL | 3 refills | Status: DC

## 2017-10-17 MED ORDER — HYDROCHLOROTHIAZIDE 25 MG PO TABS: 25.0000 mg | ORAL_TABLET | ORAL | 3 refills | Status: DC

## 2017-10-17 MED ORDER — LIPITOR 40 MG PO TABS: 40.0000 mg | ORAL_TABLET | Freq: Every day | ORAL | 3 refills | Status: DC

## 2017-10-17 MED ORDER — NORVASC 5 MG PO TABS: 5.0000 mg | ORAL_TABLET | Freq: Every day | ORAL | 3 refills | Status: DC

## 2017-10-17 NOTE — Progress Notes (Signed)
Cardiology Office Note    Date:  10/17/2017   ID:  Robert Bright, DOB 06/11/1931, MRN [id number]  PCP:  Elby Showers, MD  Cardiologist: Dr. Acie Fredrickson  Chief Complaint: 12 Months follow up  History of Present Illness:   Robert Bright is a 84 y.o. male with hx HTN, HLD prostate cancer, sinus bradycardia, bladder cancer and GERD presents for follow up.   He does not have any history of chest pain or ischemic heart disease. He had a normal nuclear stress test in 2004. He was doing well on cardiac stand point when last seen by Dr. Acie Fredrickson 06/2016.  Here today for follow up. No complains except leg cramps. Takes lipitor and HCTZ. The patient denies nausea, vomiting, fever, chest pain, palpitations, shortness of breath, orthopnea, PND, dizziness, syncope, cough, congestion, abdominal pain, hematochezia, melena, lower extremity edema. Walks at mall without any issue.    Past Medical History:  Diagnosis Date  . Anxiety   . Bladder cancer [facility]) 1993   Being followed by [name]  . ED (erectile dysfunction)   . GERD (gastroesophageal reflux disease)   . Heart murmur   . Hypercholesterolemia   . Hyperlipidemia   . Hypertension    with a component of whitecoat syndrome  . Prostate cancer ([place]) 1993   Hx ostatectomyof remote treated with radial pr  . Shingles   . Stress incontinence   . Ureteral stricture   . Vertigo     Past Surgical History:  Procedure Laterality Date  . BLADDER TUMOR EXCISION    . CARDIOVASCULAR STRESS TEST  01-02-2003   EF 68%  . COLONOSCOPY    . CYSTOSCOPY    . CYSTOSCOPY W/ RETROGRADES Bilateral 04/24/2016   Procedure: CYSTOSCOPY WITH BILATERAL RETROGRADE PYELOGRAM;  Surgeon: Raynelle Bring, MD;  Location: [facility];  Service: Urology;  Laterality: Bilateral;  . CYSTOSTOMY W/ BLADDER BIOPSY    . EYE SURGERY    . INGUINAL HERNIA REPAIR    . LYMPHADENECTOMY    . PROSTATECTOMY  1993  . TRANSURETHRAL RESECTION OF BLADDER TUMOR N/A 04/24/2016   Procedure:  TRANSURETHRAL RESECTION OF BLADDER TUMOR (TURBT);  Surgeon: Raynelle Bring, MD;  Location: [facility];  Service: Urology;  Laterality: N/A;    Current Medications:  Prior to Admission medications   Medication Sig Start Date End Date Taking? Authorizing Provider  hydrochlorothiazide (HYDRODIURIL) 25 MG tablet Take 1 tablet (25 mg total) by mouth every other day. No further refills unless May 2019 appointment is kept. Thank you. 10/12/17  Yes Nahser, Wonda Cheng, MD  LIPITOR 40 MG tablet Take 1 tablet (40 mg total) by mouth daily at 6 PM. No further refills unless May 2019 appointment is kept. Thank you. 10/12/17  Yes Nahser, Wonda Cheng, MD  LORazepam (ATIVAN) 0.5 MG tablet TAKE 1 TABLET BY MOUTH ONCE DAILY AS NEEDED FOR ANXIETY 01/11/17  Yes Baxley, Cresenciano Lick, MD  NORVASC 5 MG tablet Take 1 tablet (5 mg total) by mouth daily. No further refills unless May 2019 appointment is kept. Thank you. 10/12/17  Yes Nahser, Wonda Cheng, MD  potassium chloride SA (K-DUR,KLOR-CON) 20 MEQ tablet Take 1 tablet (20 mEq total) by mouth daily. Please call and schedule a one year follow up appointment 03/21/17  Yes Nahser, Wonda Cheng, MD  valsartan (DIOVAN) 160 MG tablet Take 1 tablet (160 mg total) by mouth daily. 10/12/17  Yes Nahser, Wonda Cheng, MD  VIAGRA 50 MG tablet Take 1 tablet (50 mg total) by  mouth daily as needed for erectile dysfunction. 01/04/16 08/18/20 Yes Nahser, Wonda Cheng, MD  metoprolol tartrate (LOPRESSOR) 25 MG tablet Take 25 mg by mouth as needed.    09/10/11  [provider]  Allergies:   Ramipril   Social History   Socioeconomic History  . Marital status: Married    Spouse name: Not on file  . Number of children: Not on file  . Years of education: Not on file  . Highest education level: Not on file  Occupational History  . Not on file  Social Needs  . Financial resource strain: Not on file  . Food insecurity:    Worry: Not on file    Inability: Not on file  . Transportation needs:    Medical: Not on  file    Non-medical: Not on file  Tobacco Use  . Smoking status: Former Smoker    Last attempt to quit: 01/16/1986    Years since quitting: 31.7  . Smokeless tobacco: Never Used  Substance and Sexual Activity  . Alcohol use: No  . Drug use: No  . Sexual activity: Not on file  Lifestyle  . Physical activity:    Days per week: Not on file    Minutes per session: Not on file  . Stress: Not on file  Relationships  . Social connections:    Talks on phone: Not on file    Gets together: Not on file    Attends religious service: Not on file    Active member of club or organization: Not on file    Attends meetings of clubs or organizations: Not on file    Relationship status: Not on file  Other Topics Concern  . 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   ROS:   Please see the history of present illness.    ROS All other systems reviewed and are negative.   PHYSICAL EXAM:   VS:  BP (!) 144/74   Pulse (!) 58   Ht 5\' 10"  (1.778 m)   Wt 177 lb (80.3 kg)   SpO2 97%   BMI 25.40 kg/m    GEN: Well nourished, well developed, in no acute distress  HEENT: normal  Neck: no JVD, carotid bruits, or masses Cardiac: RRR; no murmurs, rubs, or gallops,no edema  Respiratory:  clear to auscultation bilaterally, normal work of breathing GI: soft, nontender, nondistended, + BS MS: no deformity or atrophy  Skin: warm and dry, no rash Neuro:  Alert and Oriented x 3, Strength and sensation are intact Psych: euthymic mood, full affect  Wt Readings from Last 3 Encounters:  10/17/17 177 lb (80.3 kg)  09/04/16 178 lb (80.7 kg)  07/19/16 175 lb (79.4 kg)    Studies/Labs Reviewed:   EKG:  EKG is ordered today.  The ekg ordered today demonstrates sinus  bradycardia at rate of 52 bpm. Chronic RBBB and non specific TWI  Recent Labs: No results found for requested labs within last 8760 hours.   Lipid Panel    Component Value Date/Time   CHOL 166 06/26/2016 1113   TRIG 127 06/26/2016 1113   HDL 68 06/26/2016 1113   CHOLHDL 2.4 06/26/2016 1113   VLDL 25 06/26/2016 1113   LDLCALC 73  06/26/2016 1113    Additional studies/ records that were reviewed today include:   As above    ASSESSMENT & PLAN:   1. HTN -Relatively  control.  Will not treat aggressively given age.  Continue Norvasc 5 mg, hydrochlorothiazide 25 mg and valsartan 160 mg daily.  2. HLD = No results found for requested labs within last 8760 hours.  - Continue lipitor. Check Lipid panel and LFTS.   3.  Muscle cramps -Check electrolytes.   If normal, will reduce Lipitor dose.  4.  Sinus bradycardia -No syncope or dizziness.  He is not taking metoprolol any longer.   Medication Adjustments/Labs and Tests Ordered: Current medicines are reviewed at length with the patient today.  Concerns regarding medicines are outlined above.  Medication changes, Labs and Tests ordered today are listed in the Patient Instructions below. There are no Patient Instructions on file for this visit.   Jarrett Soho, [place]  10/17/2017 10:53 AM    [facility] [address], [place], [place]  [address] Phone: [phone]; Fax: [phone]

## 2017-10-17 NOTE — Patient Instructions (Addendum)
Medication Instructions: Your physician recommends that you continue on your current medications as directed. Please refer to the Current Medication list given to you today.  Labwork: Your physician has recommended that you have lab work today: CMET, Lipid Panel, and Magnesium  Procedures/Testing: None Ordered  Follow-Up: Your physician wants you to follow-up in: 1 YEAR with Dr. Acie Fredrickson. You will receive a reminder letter in the mail two months in advance. If you don't receive a letter, please call our office to schedule the follow-up appointment.   If you need a refill on your cardiac medications before your next appointment, please call your pharmacy.

## 2017-10-18 ENCOUNTER — Telehealth: Payer: Self-pay | Admitting: *Deleted

## 2017-10-18 MED ORDER — ATORVASTATIN CALCIUM 20 MG PO TABS: 20.0000 mg | ORAL_TABLET | Freq: Every day | ORAL | 3 refills | Status: DC

## 2017-10-18 NOTE — Telephone Encounter (Signed)
-----   Message from Bunk Foss, [place] sent at 10/17/2017  4:04 PM EDT ----- Electrolytes, Kidney function and liver function test are normal. LDL now elevated to 140s. We can try reducing lipitor to half of current dose. If muscle cramp does not go way in 1-2 months, will resume higher dose. Encouraged low fat diet.

## 2017-11-26 ENCOUNTER — Telehealth: Payer: Self-pay | Admitting: Cardiovascular Disease

## 2017-11-26 NOTE — Telephone Encounter (Signed)
Called pt and left message for pt to call back concerning pt's medication of Norvasc 5 mg tablet. Pharmacy stated that pt's medication is available in a cost saving generic alternative and requesting authorization to rewrite this prescription as a generic. Called pt to see if this is something that pt would like to do. [facility] [phone].

## 2017-12-04 ENCOUNTER — Ambulatory Visit (INDEPENDENT_AMBULATORY_CARE_PROVIDER_SITE_OTHER): Payer: Medicare Other

## 2017-12-04 ENCOUNTER — Ambulatory Visit: Payer: Medicare Other | Admitting: Sports Medicine

## 2017-12-04 ENCOUNTER — Encounter: Payer: Self-pay | Admitting: Sports Medicine

## 2017-12-04 ENCOUNTER — Other Ambulatory Visit: Payer: Self-pay | Admitting: Sports Medicine

## 2017-12-04 VITALS — BP 123/72 | HR 59

## 2017-12-04 DIAGNOSIS — M7752 Other enthesopathy of left foot: Secondary | ICD-10-CM | POA: Diagnosis not present

## 2017-12-04 DIAGNOSIS — M779 Enthesopathy, unspecified: Secondary | ICD-10-CM | POA: Diagnosis not present

## 2017-12-04 DIAGNOSIS — M79672 Pain in left foot: Secondary | ICD-10-CM

## 2017-12-04 DIAGNOSIS — I739 Peripheral vascular disease, unspecified: Secondary | ICD-10-CM

## 2017-12-04 NOTE — Progress Notes (Signed)
Subjective: Robert Bright is a 84 y.o. male patient who presents to office for evaluation of Left foot pain. Patient complains of progressive pain especially over the last 3 days in the Left foot at medial big toe joint and goes toarch Ranks pain 2-3/10 and is now interferring with daily activities when he is doing a lot of walking. Patient has tried Tylenol and epsom salt with no relief in symptoms. Pain wakes him at night Patient denies any other pedal complaints. Denies injury/trip/fall/sprain/any causative factors.   Review of Systems  Musculoskeletal: Positive for joint pain.  All other systems reviewed and are negative.    Patient Active Problem List   Diagnosis Date Noted  . Weight loss, unintentional 04/20/2014  . Right bundle branch block 10/14/2013  . Sciatica neuralgia 05/02/2012  . Hypercholesterolemia 01/18/2011  . Benign hypertensive heart disease without heart failure 01/18/2011  . Hx of bladder cancer 01/18/2011  . History of prostate cancer 01/18/2011    Current Outpatient Medications on File Prior to Visit  Medication Sig Dispense Refill  . atorvastatin (LIPITOR) 20 MG tablet Take 1 tablet (20 mg total) by mouth daily. 90 tablet 3  . hydrochlorothiazide (HYDRODIURIL) 25 MG tablet Take 1 tablet (25 mg total) by mouth every other day. 45 tablet 3  . LORazepam (ATIVAN) 0.5 MG tablet TAKE 1 TABLET BY MOUTH ONCE DAILY AS NEEDED FOR ANXIETY 30 tablet 0  . NORVASC 5 MG tablet Take 1 tablet (5 mg total) by mouth daily. 90 tablet 3  . potassium chloride SA (K-DUR,KLOR-CON) 20 MEQ tablet Take 1 tablet (20 mEq total) by mouth daily. 90 tablet 3  . valsartan (DIOVAN) 160 MG tablet Take 1 tablet (160 mg total) by mouth daily. 90 tablet 3  . VIAGRA 50 MG tablet Take 1 tablet (50 mg total) by mouth daily as needed for erectile dysfunction. 10 tablet 1  . [DISCONTINUED] metoprolol tartrate (LOPRESSOR) 25 MG tablet Take 25 mg by mouth as needed.       No current  facility-administered medications on file prior to visit.     Allergies  Allergen Reactions  . Ramipril     cough    Objective:  General: Alert and oriented x3 in no acute distress  Dermatology: No open lesions bilateral lower extremities, no webspace macerations, no ecchymosis bilateral, all nails x 10 are thickened but well manicured.  Vascular: Dorsalis Pedis and Posterior Tibial pedal pulses palpable, Capillary Fill Time 3 seconds,(+) pedal hair growth bilateral, trace edema bilateral lower extremities, varicosities, Temperature gradient within normal limits.  Neurology: Robert Bright sensation intact via light touch bilateral.  Musculoskeletal: Mild tenderness with palpation at medial 1st MTPJ left at the side of the big toe joint and distal arch, No pain with calf compression bilateral. There is limited range of motion bilateral.  Gait: Shuffle gait  Xrays  Left Foot   Impression:Mild arthritis, no other acute findings.   Assessment and Plan: Problem List Items Addressed This Visit    None    Visit Diagnoses    Left foot pain    -  Primary   Relevant Orders   DG Foot 2 Views Left   Tendonitis       PVD (peripheral vascular disease) ([place])           -Complete examination performed -Xrays reviewed -Discussed treatement options for possible tendonitis and mild arthritis in foot -Recommend topical pain cream OTC (icy hot) -ABIs normal -Recommend good supportive shoes and dispensed fascial brace to  elevate strain on distal arch -Patient to return to office as needed or sooner if condition worsens.  Landis Martins, DPM

## 2018-02-25 ENCOUNTER — Other Ambulatory Visit: Payer: Medicare Other | Admitting: Internal Medicine

## 2018-02-25 DIAGNOSIS — I119 Hypertensive heart disease without heart failure: Secondary | ICD-10-CM

## 2018-02-25 DIAGNOSIS — I4510 Unspecified right bundle-branch block: Secondary | ICD-10-CM

## 2018-02-25 DIAGNOSIS — I10 Essential (primary) hypertension: Secondary | ICD-10-CM

## 2018-02-25 DIAGNOSIS — Z8551 Personal history of malignant neoplasm of bladder: Secondary | ICD-10-CM

## 2018-02-25 DIAGNOSIS — Z8546 Personal history of malignant neoplasm of prostate: Secondary | ICD-10-CM

## 2018-02-25 DIAGNOSIS — Z0000 Encounter for general adult medical examination without abnormal findings: Secondary | ICD-10-CM

## 2018-02-25 LAB — COMPLETE METABOLIC PANEL WITH GFR
AG Ratio: 1.5 (calc) (ref 1.0–2.5)
ALT: 15 U/L (ref 9–46)
AST: 18 U/L (ref 10–35)
Albumin: 4 g/dL (ref 3.6–5.1)
Alkaline phosphatase (APISO): 50 U/L (ref 40–115)
BUN/Creatinine Ratio: 14 (calc) (ref 6–22)
BUN: 28 mg/dL — ABNORMAL HIGH (ref 7–25)
CALCIUM: 9.3 mg/dL (ref 8.6–10.3)
CO2: 25 mmol/L (ref 20–32)
Chloride: 105 mmol/L (ref 98–110)
Creat: 1.97 mg/dL — ABNORMAL HIGH (ref 0.70–1.11)
GFR, EST AFRICAN AMERICAN: 35 mL/min/{1.73_m2} — AB (ref >=60)
GFR, Est Non African American: 30 mL/min/{1.73_m2} — ABNORMAL LOW (ref >=60)
GLUCOSE: 79 mg/dL (ref 65–99)
Globulin: 2.6 g/dL (calc) (ref 1.9–3.7)
Potassium: 4.6 mmol/L (ref 3.5–5.3)
Sodium: 138 mmol/L (ref 135–146)
Total Bilirubin: 0.6 mg/dL (ref 0.2–1.2)
Total Protein: 6.6 g/dL (ref 6.1–8.1)

## 2018-02-25 LAB — PSA: PSA: <0.1 ng/mL (ref <=4.0)

## 2018-02-25 LAB — CBC WITH DIFFERENTIAL/PLATELET
Basophils Absolute: 33 cells/uL (ref 0–200)
Basophils Relative: 0.5 %
Eosinophils Absolute: 271 cells/uL (ref 15–500)
Eosinophils Relative: 4.1 %
HCT: 41.7 % (ref 38.5–50.0)
HEMOGLOBIN: 13.8 g/dL (ref 13.2–17.1)
Lymphs Abs: 1511 cells/uL (ref 850–3900)
MCH: 29.8 pg (ref 27.0–33.0)
MCHC: 33.1 g/dL (ref 32.0–36.0)
MCV: 90.1 fL (ref 80.0–100.0)
MONOS PCT: 9.5 %
MPV: 11.5 fL (ref 7.5–12.5)
NEUTROS ABS: 4158 {cells}/uL (ref 1500–7800)
Neutrophils Relative %: 63 %
Platelets: 188 10*3/uL (ref 140–400)
RBC: 4.63 10*6/uL (ref 4.20–5.80)
RDW: 13.1 % (ref 11.0–15.0)
TOTAL LYMPHOCYTE: 22.9 %
WBC mixed population: 627 cells/uL (ref 200–950)
WBC: 6.6 10*3/uL (ref 3.8–10.8)

## 2018-02-25 LAB — LIPID PANEL
CHOLESTEROL: 142 mg/dL (ref <200)
HDL: 53 mg/dL (ref >40)
LDL CHOLESTEROL (CALC): 69 mg/dL
Non-HDL Cholesterol (Calc): 89 mg/dL (calc) (ref <130)
Total CHOL/HDL Ratio: 2.7 (calc) (ref <5.0)
Triglycerides: 114 mg/dL (ref <150)

## 2018-03-04 ENCOUNTER — Other Ambulatory Visit: Payer: Medicare Other | Admitting: Internal Medicine

## 2018-03-05 ENCOUNTER — Encounter: Payer: Self-pay | Admitting: Internal Medicine

## 2018-03-05 ENCOUNTER — Ambulatory Visit (INDEPENDENT_AMBULATORY_CARE_PROVIDER_SITE_OTHER): Payer: Medicare Other | Admitting: Internal Medicine

## 2018-03-05 VITALS — BP 140/70 | HR 56 | Ht 67.5 in | Wt 175.0 lb

## 2018-03-05 DIAGNOSIS — R7989 Other specified abnormal findings of blood chemistry: Secondary | ICD-10-CM | POA: Diagnosis not present

## 2018-03-05 DIAGNOSIS — Z23 Encounter for immunization: Secondary | ICD-10-CM | POA: Diagnosis not present

## 2018-03-05 DIAGNOSIS — Z8546 Personal history of malignant neoplasm of prostate: Secondary | ICD-10-CM

## 2018-03-05 DIAGNOSIS — I119 Hypertensive heart disease without heart failure: Secondary | ICD-10-CM | POA: Diagnosis not present

## 2018-03-05 DIAGNOSIS — Z0000 Encounter for general adult medical examination without abnormal findings: Secondary | ICD-10-CM

## 2018-03-05 DIAGNOSIS — Z8551 Personal history of malignant neoplasm of bladder: Secondary | ICD-10-CM

## 2018-03-05 DIAGNOSIS — I10 Essential (primary) hypertension: Secondary | ICD-10-CM | POA: Diagnosis not present

## 2018-03-05 DIAGNOSIS — Z8639 Personal history of other endocrine, nutritional and metabolic disease: Secondary | ICD-10-CM

## 2018-03-05 DIAGNOSIS — F411 Generalized anxiety disorder: Secondary | ICD-10-CM

## 2018-03-05 LAB — POCT URINALYSIS DIPSTICK
Appearance: NORMAL
BILIRUBIN UA: NEGATIVE
Blood, UA: NEGATIVE
GLUCOSE UA: NEGATIVE
Ketones, UA: NEGATIVE
LEUKOCYTES UA: NEGATIVE
Nitrite, UA: NEGATIVE
Odor: NORMAL
Protein, UA: NEGATIVE
Spec Grav, UA: 1.01 (ref 1.010–1.025)
Urobilinogen, UA: 0.2 E.U./dL
pH, UA: 7.5 (ref 5.0–8.0)

## 2018-03-05 LAB — BASIC METABOLIC PANEL
BUN/Creatinine Ratio: 15 (calc) (ref 6–22)
BUN: 35 mg/dL — ABNORMAL HIGH (ref 7–25)
CALCIUM: 9.7 mg/dL (ref 8.6–10.3)
CHLORIDE: 105 mmol/L (ref 98–110)
CO2: 20 mmol/L (ref 20–32)
CREATININE: 2.41 mg/dL — AB (ref 0.70–1.11)
Glucose, Bld: 95 mg/dL (ref 65–99)
POTASSIUM: 4.1 mmol/L (ref 3.5–5.3)
Sodium: 137 mmol/L (ref 135–146)

## 2018-03-05 MED ORDER — NEOMYCIN-POLYMYXIN-HC 3.5-10000-1 OT SOLN: 4.0000 [drp] | Freq: Four times a day (QID) | OTIC | 0 refills | Status: DC

## 2018-03-05 NOTE — Progress Notes (Signed)
Subjective:    Patient ID: Robert Bright, male    DOB: [birth year], 84 y.o.   MRN: [id number]  HPI 84 year old Male fro [employer], health maintenance exam and evaluation of medical issues.  He and his wife continue to reside alone here in [place].  His daughter lives in [place].  His wife has memory issues and has some help during the day.  He used to be followed by [name] who retired, and he started coming here for routine health maintenance in January 2018.  He was found to have prostate cancer in 1993 treated with radical retropubic prostatectomy by Dr. Laurence Ferrari.  His PSA has subsequently been undetectable.   Subsequently, he was diagnosed with cancer of the bladder wall and is followed by Dr. [name].  Records indicate he had an initial TURBT by Dr. Etta Quill in 2003, recurrence in 2005, recurrence in 2009, 2010, 2013, 2014, 2015 and 2017.  He had a biopsy November 2017 during a TURP showing in situ noninvasive low-grade papillary urothelial carcinoma.  Detrusor muscle not involved.  He last saw [name] in September 2018 for cystoscopy surveillance.  No tumor detected in 2018.  History of bladder neck contracture and urethral stricture.  He underwent balloon dilatation of his bladder neck in April 2009 which has made office cystoscopy possible.  He required repeat dilatation in February 2010.  He was noted to have a small membranous urethral stricture March 2013 requiring dilatation and intermittently since then.  He continues to be sexually active with MUSE.  Has had stress incontinence after dilatation of bladder neck in 2009 which improved.  Long-standing history of hyperlipidemia and hypertension.  History of right bundle branch block.  Maintained on statin therapy.  Herpes zoster affecting left face orbit and scalp in 2014.  Social history: He is married.  Wife has dementia.  Wife used to be a Pharmacist, hospital.  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  He does not smoke or consume alcohol.  His daughter, [name] is his primary contact and her phone number is [phone].  He has 1 son and 2 daughters.  Family history: Father died at age 76 with complications of rheumatoid arthritis.  Mother died at age 38 from complications of surgery.  2 sisters ages 47 and 47 good health.  Mother had dementia.  Dr. Katha Cabal saw him in 2014 regarding GE reflux and he was treated with lansoprazole.  At that time he was drinking 6 cups of coffee daily.  He is intolerant of ramipril.  Current medications include HCTZ, Lipitor, Diovan, amlodipine and potassium supplement.  History of anxiety.  This is treated with Ativan as needed.  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      Review of Systems tinnitus, ED, some urinary incontinence     Objective:   Physical Exam  Constitutional: He is oriented to person, place, and time. He appears well-developed and well-nourished. No distress.  HENT:  Head: Normocephalic and atraumatic.  Right Ear: External ear normal.  Left Ear: External ear normal.  Mouth/Throat: Oropharynx is clear and moist. No oropharyngeal exudate.  Eyes: Pupils are equal, round, and reactive to light. Conjunctivae are normal. Right eye exhibits no discharge. Left eye exhibits no discharge. No scleral icterus.  Neck: Neck supple. No thyromegaly present.  Cardiovascular: Normal rate, regular rhythm and normal heart sounds.  No murmur heard. Pulmonary/Chest: Effort  normal and breath sounds normal. No stridor. No respiratory distress. He has no rales.  Abdominal: Soft. Bowel sounds are normal. He exhibits no distension and no mass. There is no tenderness. There is no rebound and no guarding.  Genitourinary:  Genitourinary Comments: Deferred to [name]  Musculoskeletal: He exhibits no edema.  Lymphadenopathy:    He has no cervical adenopathy.  Neurological: He is  alert and oriented to person, place, and time. No cranial nerve deficit. Coordination normal.  Skin: Skin is warm and dry. He is not diaphoretic.  Psychiatric: He has a normal mood and affect. His behavior is normal. Judgment and thought content normal.  Vitals reviewed.         Assessment & Plan:  History of prostate cancer-PSA is  less than 0.1  History of bladder cancer-followed by [name]  Essential hypertension-stable on current regimen  Hyperlipidemia-lipid panel is normal  Erectile dysfunction after prostate cancer surgery  Bilateral renal cysts  Anxiety -takes Ativan as needed  Elevated serum creatinine-creatinine is increased from 0.97-1.97. He previously had not had normal creatinine in years. Dr. Acie Fredrickson checked his creatinine in May and it was 0.97.  A year ago it was 1.73.  In 2017 creatinine was 1.63.  In 2016 creatinine was 1.53 and was 1.5 in 2015.  In 2014, creatinine was 1.5, in 2013 creatinine was 1.5, in 2012 creatinine was 1.4 and in 2010 creatinine was 1.4.  At that time, a normal creatinine was considered to be 0.4-1.5   Ultrasound shows bilateral renal cysts and possible small kidney stones but no hydronephrosis. Elevated creatinine may be multifactorial and likely just needs to continue to be followed however , I am going to ask him to see Nephrologist.  No evidence of significant obstruction.  Subjective:   Patient presents for Medicare Annual/Subsequent preventive examination.  Review Past Medical/Family/Social: See above   Risk Factors  Current exercise habits: Walks Dietary issues discussed: Low-fat low carbohydrate  Cardiac risk factors: Hypertension hyperlipidemia  Depression Screen  (Note: if answer to either of the following is "Yes", a more complete depression screening is indicated)   Over the past two weeks, have you felt down, depressed or hopeless? No  Over the past two weeks, have you felt little interest or pleasure in doing  things? No Have you lost interest or pleasure in daily life? No Do you often feel hopeless? No Do you cry easily over simple problems? No   Activities of Daily Living  In your present state of health, do you have any difficulty performing the following activities?:   Driving? No  Managing money? No  Feeding yourself? No  Getting from bed to chair? No  Climbing a flight of stairs? No  Preparing food and eating?: No  Bathing or showering? No  Getting dressed: No  Getting to the toilet? No  Using the toilet:No  Moving around from place to place: No  In the past year have you fallen or had a near fall?:No  Are you sexually active?  Answered no but used to be in 2018 Do you have more than one partner? No   Hearing Difficulties: No  Do you often ask people to speak up or repeat themselves? No  Do you experience ringing or noises in your ears?  Yes Do you have difficulty understanding soft or whispered voices? No  Do you feel that you have a problem with memory? No Do you often misplace items? No    Home Safety:  Do you have a smoke alarm at your residence? Yes Do you have grab bars in the bathroom?  Yes Do you have throw rugs in your house?  Yes   Cognitive Testing  Alert? Yes Normal Appearance?Yes  Oriented to person? Yes Place? Yes  Time? Yes  Recall of three objects? Yes  Can perform simple calculations? Yes  Displays appropriate judgment?Yes  Can read the correct time from a watch face?Yes   List the Names of Other Physician/Practitioners you currently use:  See referral list for the physicians patient is currently seeing.     Review of Systems: See above   Objective:     General appearance: Appears stated age and  frail. Head: Normocephalic, without obvious abnormality, atraumatic  Eyes: conj clear, EOMi PEERLA  Ears: normal TM's and external ear canals both ears  Nose: Nares normal. Septum midline. Mucosa normal. No drainage or sinus tenderness.  Throat:  lips, mucosa, and tongue normal; teeth and gums normal  Neck: no adenopathy, no carotid bruit, no JVD, supple, symmetrical, trachea midline and thyroid not enlarged, symmetric, no tenderness/mass/nodules  No CVA tenderness.  Lungs: clear to auscultation bilaterally  Breasts: normal appearance, no masses or tenderness Heart: regular rate and rhythm, S1, S2 normal, no murmur, click, rub or gallop  Abdomen: soft, non-tender; bowel sounds normal; no masses, no organomegaly  Musculoskeletal: ROM normal in all joints, no crepitus, no deformity, Normal muscle strengthen. Back  is symmetric, no curvature. Skin: Skin color, texture, turgor normal. No rashes or lesions  Lymph nodes: Cervical, supraclavicular, and axillary nodes normal.  Neurologic: CN 2 -12 Normal, Normal symmetric reflexes. Normal coordination and gait  Psych: Alert & Oriented x 3, Mood appear stable.    Assessment:    Annual wellness medicare exam   Plan:    During the course of the visit the patient was educated and counseled about appropriate screening and preventive services including: Tdap vaccine       Patient Instructions (the written plan) was given to the patient.  Medicare Attestation  I have personally reviewed:  The patient's medical and social history  Their use of alcohol, tobacco or illicit drugs  Their current medications and supplements  The patient's functional ability including ADLs,fall risks, home safety risks, cognitive, and hearing and visual impairment  Diet and physical activities  Evidence for depression or mood disorders  The patient's weight, height, BMI, and visual acuity have been recorded in the chart. I have made referrals, counseling, and provided education to the patient based on review of the above and I have provided the patient with a written personalized care plan for preventive services.

## 2018-03-05 NOTE — Patient Instructions (Addendum)
To have ultrasound of kidneys. Refer to [facility] for CKD.  Tdap vaccine given.  Follow-up in 6 months rather than 1 year.

## 2018-03-08 ENCOUNTER — Ambulatory Visit
Admission: RE | Admit: 2018-03-08 | Discharge: 2018-03-08 | Disposition: A | Discharge status: Home / Self Care | Payer: Medicare Other | Source: Ambulatory Visit | Attending: Internal Medicine | Admitting: Internal Medicine

## 2018-03-09 ENCOUNTER — Encounter: Payer: Self-pay | Admitting: Internal Medicine

## 2018-03-15 ENCOUNTER — Telehealth: Payer: Self-pay

## 2018-03-15 NOTE — Telephone Encounter (Signed)
Per Kidney doctor patient needs to stop valsartan 160mg . Patient was notified, pt verbalized understanding.

## 2018-03-29 ENCOUNTER — Encounter: Payer: Self-pay | Admitting: Cardiovascular Disease

## 2018-03-30 ENCOUNTER — Other Ambulatory Visit: Payer: Self-pay | Admitting: Cardiovascular Disease

## 2018-03-30 DIAGNOSIS — I119 Hypertensive heart disease without heart failure: Secondary | ICD-10-CM

## 2018-04-01 NOTE — Telephone Encounter (Signed)
Outpatient Medication Detail    Disp Refills Start End   hydrochlorothiazide (HYDRODIURIL) 25 MG tablet 45 tablet 3 10/17/2017    Sig - Route: Take 1 tablet (25 mg total) by mouth every other day. - Oral   Sent to pharmacy as: hydrochlorothiazide (HYDRODIURIL) 25 MG tablet   E-Prescribing Status: Receipt confirmed by pharmacy (10/17/2017 10:54 AM EDT)   [place], [place]

## 2018-04-16 ENCOUNTER — Telehealth: Payer: Self-pay | Admitting: Cardiovascular Disease

## 2018-04-16 NOTE — Telephone Encounter (Signed)
Pt states he is going to keep his follow-up appt with Dr Acie Fredrickson for tomorrow 10/30, as advised by his kidney MD.  Pt states that a lot has changed over the last 6 months with his health, and he needs to endorse these changes.  Pt states that he went in for a Physical with his PCP, and his renal function was elevated at that time, so she referred him to a Kidney MD.  Pt states he saw the Kidney MD and they did all kinds of scans on him, which all came back negative.  Pt states that his renal MD did state that his issues were coming from taking the medication Diovan, so they discontinued this med.  Pt states that all was improving, but now his BP is starting to elevate again.  Pt states he has 2 weeks worth of logged BP and HR readings he will bring to his visit tomorrow, for Dr Acie Fredrickson to review. Pt states he will also bring an updated med list to his appt as well.  Pt just simply wanted to pass this message along to Dr Acie Fredrickson and Sharyn Lull RN, so they have a reference for tomorrows visit. Advised the pt to keep his scheduled appt with Dr Acie Fredrickson for tomorrow.  Pt verbalized understanding and agrees with this plan.  Pt more than gracious for all the assistance provided.

## 2018-04-16 NOTE — Telephone Encounter (Signed)
Events noted. Will review the meds and BP log tomorrow at the office visit

## 2018-04-16 NOTE — Telephone Encounter (Signed)
New Message   Patient is calling because he received a call from the nurse stating that he did nit not have to come to his appointment on 10/30. He states that he wants to keep the appointment because he has some questions and concerns especially concerning his BP. He said that his BP is having some effects on his kidneys.

## 2018-04-17 ENCOUNTER — Ambulatory Visit: Payer: Medicare Other | Admitting: Cardiovascular Disease

## 2018-04-17 ENCOUNTER — Encounter: Payer: Self-pay | Admitting: Cardiovascular Disease

## 2018-04-17 VITALS — BP 132/66 | HR 76 | Ht 67.5 in | Wt 168.4 lb

## 2018-04-17 DIAGNOSIS — I119 Hypertensive heart disease without heart failure: Secondary | ICD-10-CM

## 2018-04-17 MED ORDER — ATORVASTATIN CALCIUM 20 MG PO TABS: 20.0000 mg | ORAL_TABLET | Freq: Every day | ORAL | 3 refills | Status: DC

## 2018-04-17 MED ORDER — POTASSIUM CHLORIDE CRYS ER 20 MEQ PO TBCR: 20.0000 meq | EXTENDED_RELEASE_TABLET | Freq: Every day | ORAL | 3 refills | Status: DC

## 2018-04-17 MED ORDER — HYDROCHLOROTHIAZIDE 25 MG PO TABS: 25.0000 mg | ORAL_TABLET | ORAL | 3 refills | Status: DC

## 2018-04-17 NOTE — Progress Notes (Signed)
Cardiology Office Note   Date:  04/17/2018   ID:  Robert Bright, DOB Oct 14, 1930, MRN [id number]  PCP:  Elby Showers, MD  Cardiologist:  Previous Darlin Coco MD,  Now Latoshia Monrroy   Chief Complaint  Patient presents with  . Hypertension  . Hyperlipidemia      Notes from Dr. Liborio Nixon is a 83 y.o. male who presents for scheduled 6 month follow-up visit.  He is a retired IT trainer.[name] He has a history of essential hypertension and a history of hypercholesterolemia. He does not have any history of chest pain or ischemic heart disease. He had a normal nuclear stress test in 2004. He does have a past history of prostate cancer and a more recent history of bladder cancer. [name] is his urologist. Since last visit he has been feeling well .  When we last saw him [place]  However he states that the events to many doctors visits scheduled that they never got to the beach.  He is disappointed in that. From the cardiac standpoint he has not been experiencing chest pain or shortness of breath.  We reviewed his lab work which is satisfactory.  January 04, 2016:  Seen for the first time today .  Transfer from 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 31, 2018:   Robert Bright is seen back today for his HTN and hyperlipidemia .    Had a skin cancer removed from his nose .  Also has had a bladder cancer removed.   April 17, 2018:  Robert Bright is seen today ( with daughter, [name])   for follow-up of his hypertension and hyperlipidemia. He was seen by Vin  in May, 2019. Has been seen by Dr. Johnney Ou ( nephrology )  She stopped the Diovan several weeks ago . BP has been a bit elevated ( by  his BP cuff)  Eats at [employer] almost every day  Still eats a very salty diet.  Eats [employer] sandwiches 3-4 times a week.  He has chicken noodle soup almost every evening.    Past Medical History:  Diagnosis Date  . Anxiety   . Bladder cancer [facility]) 1993   Being followed by [name]  . ED (erectile dysfunction)   . GERD (gastroesophageal reflux disease)   . Heart murmur   . Hypercholesterolemia   . Hyperlipidemia   . Hypertension    with a component of whitecoat syndrome  . Prostate cancer ([place]) 1993   Hx ostatectomyof remote treated with radial pr  . Shingles   . Stress incontinence   . Ureteral stricture   . Vertigo     Past Surgical History:  Procedure Laterality Date  . BLADDER TUMOR EXCISION    . CARDIOVASCULAR STRESS TEST  01-02-2003   EF 68%  . COLONOSCOPY    . CYSTOSCOPY    . CYSTOSCOPY W/ RETROGRADES Bilateral 04/24/2016   Procedure: CYSTOSCOPY WITH BILATERAL RETROGRADE PYELOGRAM;  Surgeon: Raynelle Bring, MD;  Location: [facility];  Service: Urology;  Laterality: Bilateral;  . CYSTOSTOMY W/ BLADDER BIOPSY    . EYE SURGERY    . INGUINAL  HERNIA REPAIR    . LYMPHADENECTOMY    . PROSTATECTOMY  1993  . TRANSURETHRAL RESECTION OF BLADDER TUMOR N/A 04/24/2016   Procedure: TRANSURETHRAL RESECTION OF BLADDER TUMOR (TURBT);  Surgeon: Raynelle Bring, MD;  Location: [facility];  Service: Urology;  Laterality: N/A;     Current Outpatient Medications  Medication Sig Dispense Refill  . atorvastatin (LIPITOR) 20 MG tablet Take 1 tablet (20 mg total) by mouth daily. 90 tablet 3  . hydrochlorothiazide (HYDRODIURIL) 25 MG tablet Take 1 tablet (25 mg total) by mouth every other day. 45 tablet 3  . LORazepam (ATIVAN) 0.5 MG tablet TAKE 1 TABLET BY MOUTH ONCE DAILY AS NEEDED FOR ANXIETY 30 tablet 0  . neomycin-polymyxin-hydrocortisone (CORTISPORIN) OTIC solution Place 4 drops into both ears 4 (four) times daily. 10 mL 0  . NORVASC 5 MG tablet Take 1 tablet (5 mg total) by  mouth daily. 90 tablet 3  . potassium chloride SA (K-DUR,KLOR-CON) 20 MEQ tablet Take 1 tablet (20 mEq total) by mouth daily. 90 tablet 3  . VIAGRA 50 MG tablet Take 1 tablet (50 mg total) by mouth daily as needed for erectile dysfunction. 10 tablet 1  . valsartan (DIOVAN) 160 MG tablet Take 1 tablet (160 mg total) by mouth daily. (Patient not taking: Reported on 04/17/2018) 90 tablet 3   No current facility-administered medications for this visit.     Allergies:   Ramipril    Social History:  The patient  reports that he quit smoking about 32 years ago. He has never used smokeless tobacco. He reports that he does not drink alcohol or use drugs.   Family History:  The patient's family history includes Arthritis/Rheumatoid in his father; Dementia in his mother; Healthy in his sister and sister.    ROS:  Please see the history of present illness.   Otherwise, review of systems are positive for none.   All other systems are reviewed and negative.    Physical Exam: Blood pressure 132/66, pulse 76, height 5' 7.5" (1.715 m), weight 168 lb 6.4 oz (76.4 kg), SpO2 99 %.  GEN:  Well nourished, well developed in no acute distress HEENT: Normal NECK: No JVD; No carotid bruits LYMPHATICS: No lymphadenopathy CARDIAC: RRR  RESPIRATORY:  Clear to auscultation without rales, wheezing or rhonchi  ABDOMEN: Soft, non-tender, non-distended MUSCULOSKELETAL:  No edema; No deformity  SKIN: Warm and dry NEUROLOGIC:  Alert and oriented x 3    EKG:       Recent Labs: 10/17/2017: Magnesium 2.3 02/25/2018: ALT 15; Hemoglobin 13.8; Platelets 188 03/05/2018: BUN 35; Creat 2.41; Potassium 4.1; Sodium 137    Lipid Panel    Component Value Date/Time   CHOL 142 02/25/2018 1129   CHOL 255 (H) 10/17/2017 1101   TRIG 114 02/25/2018 1129   HDL 53 02/25/2018 1129   HDL 96 10/17/2017 1101   CHOLHDL 2.7 02/25/2018 1129   VLDL 25 06/26/2016 1113   LDLCALC 69 02/25/2018 1129      Wt Readings from Last 3  Encounters:  04/17/18 168 lb 6.4 oz (76.4 kg)  03/05/18 175 lb (79.4 kg)  10/17/17 177 lb (80.3 kg)        ASSESSMENT AND PLAN:  1. essential hypertension:    Blood pressure is normal here today.  His Diovan has been discontinued by his nephrologist.  I advised him to greatly reduce his salt intake.  It is clear that he eats a very salty diet.  He goes out to eat for  multiple meals through the day and typically takes restaurants that service salty diet-[employer], [employer] restaurant.  He also eats chicken noodle soup in the evenings.  We will have him continue to follow-up with his nephrologist.  He has a creatinine of 2.41.  We will have Dr. Johnney Ou continue to help manage his blood pressure.  2. Hyperlipidemia -   recent labs show an L LDL of 69.  Total cholesterol is 142.  HDL is 53.   3. right bundle branch block    4. Sinus bradycardia:   Stable no symptoms    Current medicines are reviewed at length with the patient today.  The patient does not have concerns regarding medicines.  The following changes have been made:  no change  Labs/ tests ordered today include:   No orders of the defined types were placed in this encounter.  Follow-up with Vin  in 6 months.  I will see him in 1 year.   Mertie Moores, MD  04/17/2018 9:50 AM    [place],  [place], [place]  [id number] Pager [phone] Phone: [phone]; Fax: [phone]

## 2018-04-17 NOTE — Patient Instructions (Signed)

## 2018-07-01 ENCOUNTER — Other Ambulatory Visit: Payer: Self-pay | Admitting: Internal Medicine

## 2018-07-01 MED ORDER — HYDROCHLOROTHIAZIDE 25 MG PO TABS: 25.0000 mg | ORAL_TABLET | ORAL | 3 refills | Status: DC

## 2018-07-01 NOTE — Telephone Encounter (Signed)
Refill x one year °

## 2018-07-01 NOTE — Telephone Encounter (Signed)
Patient here with his wife for an appointment this afternoon.  He is requesting a refill on his HCTZ 25 mg.    Pharmacy:  [facility] @ [address].  Thank you.

## 2018-08-19 ENCOUNTER — Telehealth: Payer: Self-pay | Admitting: Internal Medicine

## 2018-08-19 ENCOUNTER — Encounter: Payer: Self-pay | Admitting: Internal Medicine

## 2018-08-19 MED ORDER — HYDROCHLOROTHIAZIDE 25 MG PO TABS: 25.0000 mg | ORAL_TABLET | Freq: Every day | ORAL | 3 refills | Status: DC

## 2018-08-19 NOTE — Telephone Encounter (Signed)
He walked in today saying he was out of HCTZ. Has been taking every day and eats out a lot at high salt food restaurants. Eats chicken noodle soup for supper. Have refilled this and he will come back in about 10 days for nurse visit , B-met and BP reading. He is followed at [facility] for elevated creatinine.

## 2018-09-23 ENCOUNTER — Telehealth: Payer: Self-pay | Admitting: Internal Medicine

## 2018-09-23 NOTE — Telephone Encounter (Signed)
Robert Bright [phone]  [name] called to say that he had some abdominal pain start yesterday afternoon and would like to come in for you to check and see if he has a hernia. He could come in tomorrow after 11:00 when his caregiver comes.

## 2018-09-23 NOTE — Telephone Encounter (Signed)
It is not a good idea to come in to be examined. Is he constipated?. A hernia is not a medical emergency. Is he running a fever? Does he have urine symptoms?

## 2018-09-23 NOTE — Telephone Encounter (Signed)
Pt was notified of Dr. Baxley's instructions, pt verbalized understanding.   

## 2018-10-03 ENCOUNTER — Other Ambulatory Visit: Payer: Self-pay

## 2018-10-03 ENCOUNTER — Ambulatory Visit (INDEPENDENT_AMBULATORY_CARE_PROVIDER_SITE_OTHER): Payer: Medicare Other | Admitting: Internal Medicine

## 2018-10-03 ENCOUNTER — Encounter: Payer: Self-pay | Admitting: Internal Medicine

## 2018-10-03 DIAGNOSIS — H60502 Unspecified acute noninfective otitis externa, left ear: Secondary | ICD-10-CM

## 2018-10-03 MED ORDER — NEOMYCIN-POLYMYXIN-HC 1 % OT SOLN: 3.0000 [drp] | Freq: Four times a day (QID) | OTIC | 1 refills | Status: DC

## 2018-10-03 NOTE — Patient Instructions (Signed)
Cortisporin otic suspension 4 drops in left ear times a day for 5-7 N

## 2018-10-03 NOTE — Progress Notes (Signed)
   Subjective:    Patient ID: Robert Bright, male    DOB: Sep 27, 1930, 85 y.o.   MRN: [id number]  HPI 85 year old Male complaining of left ear drainage.  Says he has history of recurrent otitis externa.  Generally responds to Cortisporin otic suspension.  He would like that cold man.  Interactive audio in telecommunications were attempted between this provider and patient but patient does not have access to video capability.  We continued and completed visit with audio only.  He is doing well during the pandemic.  His wife has dementia.  He has caregivers to help with her and he says she is doing well at the present time.  He says he is doing well.  He is watching his blood pressure.  He is followed at [facility] for elevated creatinine.  His physical exam is due in September.  Mariann Laster will contact him for an appointment.    Review of Systems     Objective:   Physical Exam  Unable to examine but by history taking, he does apparently have left otitis externa.       Assessment & Plan:  Left otitis externa  Plan: Call in Cortisporin otic suspension 4 drops in left ear 4 times a day for 5 to 7 days

## 2018-10-09 ENCOUNTER — Ambulatory Visit: Payer: Medicare Other | Admitting: Podiatry

## 2018-10-09 ENCOUNTER — Other Ambulatory Visit: Payer: Self-pay

## 2018-10-09 ENCOUNTER — Encounter: Payer: Self-pay | Admitting: Podiatry

## 2018-10-09 VITALS — Temp 97.7°F

## 2018-10-09 DIAGNOSIS — L84 Corns and callosities: Secondary | ICD-10-CM | POA: Diagnosis not present

## 2018-10-09 DIAGNOSIS — I739 Peripheral vascular disease, unspecified: Secondary | ICD-10-CM

## 2018-10-09 DIAGNOSIS — M216X9 Other acquired deformities of unspecified foot: Secondary | ICD-10-CM

## 2018-10-09 NOTE — Progress Notes (Signed)
Subjective:   Patient ID: Robert Bright, male   DOB: 85 y.o.   MRN: [id number]   HPI Patient presents with new problem concerning lesion underneath the left foot that is very tender it makes it hard for him to walk.  States it is been present for a few months he does not remember specific injury   ROS      Objective:  Physical Exam  Neurovascular status intact with patient found to have plantarflexed metatarsal bone with structural cavus deformity and significant keratotic lesion associated with it     Assessment:  Plantarflexed metatarsal with keratotic lesion that is painful when pressed     Plan:  H&P and discussed condition explaining the structural deformity that is present and the fact that ultimate elevation of the metatarsal may be necessary or orthotics may be necessary.  Today deep debridement of lesion accomplished and there was a small amount of bleeding and I applied sterile dressing and instructed on soaks and padding

## 2018-11-01 ENCOUNTER — Other Ambulatory Visit: Payer: Self-pay | Admitting: Physician Assistant

## 2018-11-07 ENCOUNTER — Telehealth: Payer: Self-pay | Admitting: Nurse Practitioner

## 2018-11-07 NOTE — Telephone Encounter (Signed)
Left message for patient to call back regarding appointment next week with Dr. Acie Fredrickson

## 2018-11-07 NOTE — Telephone Encounter (Signed)
Spoke with patient who agrees to telephone visits for himself and his wife. He states his wife has dementia. He will have vital signs ready for the visit.    YOUR CARDIOLOGY TEAM HAS ARRANGED FOR AN E-VISIT FOR YOUR APPOINTMENT - PLEASE REVIEW IMPORTANT INFORMATION BELOW SEVERAL DAYS PRIOR TO YOUR APPOINTMENT  Due to the recent COVID-19 pandemic, we are transitioning in-person office visits to tele-medicine visits in an effort to decrease unnecessary exposure to our patients, their families, and staff. These visits are billed to your insurance just like a normal visit is. We also encourage you to sign up for MyChart if you have not already done so. You will need a smartphone if possible. For patients that do not have this, we can still complete the visit using a regular telephone but do prefer a smartphone to enable video when possible. You may have a family member that lives with you that can help. If possible, we also ask that you have a blood pressure cuff and scale at home to measure your blood pressure, heart rate and weight prior to your scheduled appointment. Patients with clinical needs that need an in-person evaluation and testing will still be able to come to the office if absolutely necessary. If you have any questions, feel free to call our office.   YOUR PROVIDER WILL BE USING THE FOLLOWING PLATFORM TO COMPLETE YOUR VISIT: TELEPHONE    2-3 DAYS BEFORE YOUR APPOINTMENT  You will receive a telephone call from one of our [place] team members - your caller ID may say "Unknown caller." If this is a video visit, we will walk you through how to get the video launched on your phone. We will remind you check your blood pressure, heart rate and weight prior to your scheduled appointment. If you have an Apple Watch or Kardia, please upload any pertinent ECG strips the day before or morning of your appointment to [facility]. Our staff will also make sure you have reviewed the consent and agree to move  forward with your scheduled tele-health visit.    THE DAY OF YOUR APPOINTMENT  Approximately 15 minutes prior to your scheduled appointment, you will receive a telephone call from one of [place] team - your caller ID may say "Unknown caller."  Our staff will confirm medications, vital signs for the day and any symptoms you may be experiencing. Please have this information available prior to the time of visit start. It may also be helpful for you to have a pad of paper and pen handy for any instructions given during your visit. They will also walk you through joining the smartphone meeting if this is a video visit.   CONSENT FOR TELE-HEALTH VISIT - PLEASE REVIEW  I hereby voluntarily request, consent and authorize [facility] and its employed or contracted physicians, physician assistants, nurse practitioners or other licensed health care professionals (the Practitioner), to provide me with telemedicine health care services (the "Services") as deemed necessary by the treating Practitioner. I acknowledge and consent to receive the Services by the Practitioner via telemedicine. I understand that the telemedicine visit will involve communicating with the Practitioner through live audiovisual communication technology and the disclosure of certain medical information by electronic transmission. I acknowledge that I have been given the opportunity to request an in-person assessment or other available alternative prior to the telemedicine visit and am voluntarily participating in the telemedicine visit.  I understand that I have the right to withhold or withdraw my consent to the use of  telemedicine in the course of my care at any time, without affecting my right to future care or treatment, and that the Practitioner or I may terminate the telemedicine visit at any time. I understand that I have the right to inspect all information obtained and/or recorded in the course of the telemedicine visit and may  receive copies of available information for a reasonable fee.  I understand that some of the potential risks of receiving the Services via telemedicine include:  [name] Delay or interruption in medical evaluation due to technological equipment failure or disruption; . Information transmitted may not be sufficient (e.g. poor resolution of images) to allow for appropriate medical decision making by the Practitioner; and/or  . In rare instances, security protocols could fail, causing a breach of personal health information.  Furthermore, I acknowledge that it is my responsibility to provide information about my medical history, conditions and care that is complete and accurate to the best of my ability. I acknowledge that Practitioner's advice, recommendations, and/or decision may be based on factors not within their control, such as incomplete or inaccurate data provided by me or distortions of diagnostic images or specimens that may result from electronic transmissions. I understand that the practice of medicine is not an exact science and that Practitioner makes no warranties or guarantees regarding treatment outcomes. I acknowledge that I will receive a copy of this consent concurrently upon execution via email to the email address I last provided but may also request a printed copy by calling the office of [facility].    I understand that my insurance will be billed for this visit.   I have read or had this consent read to me. . I understand the contents of this consent, which adequately explains the benefits and risks of the Services being provided via telemedicine.  . I have been provided ample opportunity to ask questions regarding this consent and the Services and have had my questions answered to my satisfaction. . I give my informed consent for the services to be provided through the use of telemedicine in my medical care  By participating in this telemedicine visit I agree to the above.

## 2018-11-07 NOTE — Telephone Encounter (Signed)
Follow  Up ° ° ° ° °Pt is returning call ° ° ° °Please call back  °

## 2018-11-12 ENCOUNTER — Telehealth (INDEPENDENT_AMBULATORY_CARE_PROVIDER_SITE_OTHER): Payer: Medicare Other | Admitting: Cardiovascular Disease

## 2018-11-12 ENCOUNTER — Other Ambulatory Visit: Payer: Self-pay

## 2018-11-12 ENCOUNTER — Encounter: Payer: Self-pay | Admitting: Cardiovascular Disease

## 2018-11-12 VITALS — BP 132/84 | HR 70 | Temp 98.0°F | Ht 69.0 in | Wt 163.0 lb

## 2018-11-12 DIAGNOSIS — I119 Hypertensive heart disease without heart failure: Secondary | ICD-10-CM

## 2018-11-12 DIAGNOSIS — E7800 Pure hypercholesterolemia, unspecified: Secondary | ICD-10-CM

## 2018-11-12 DIAGNOSIS — Z7189 Other specified counseling: Secondary | ICD-10-CM

## 2018-11-12 NOTE — Progress Notes (Signed)
Virtual Visit via Telephone Note   This visit type was conducted due to national recommendations for restrictions regarding the COVID-19 Pandemic (e.g. social distancing) in an effort to limit this patient's exposure and mitigate transmission in our community.  Due to his co-morbid illnesses, this patient is at least at moderate risk for complications without adequate follow up.  This format is felt to be most appropriate for this patient at this time.  The patient did not have access to video technology/had technical difficulties with video requiring transitioning to audio format only (telephone).  All issues noted in this document were discussed and addressed.  No physical exam could be performed with this format.  Please refer to the patient's chart for his  consent to telehealth for [facility].   Date:  11/12/2018   ID:  Robert Bright, DOB 09-26-1930, MRN [id number]  Patient Location: Home Provider Location: Office  PCP:  Elby Showers, MD  Cardiologist:  Mertie Moores, MD  Electrophysiologist:  None   Evaluation Performed:  Follow-Up Visit  Notes from Dr. Liborio Nixon is a 85 y.o. male who presents for scheduled 6 month follow-up visit.  He is a retired IT trainer.[name] He has a history of essential hypertension and a history of hypercholesterolemia. He does not have any history of chest pain or ischemic heart disease. He had a normal nuclear stress test in 2004. He does have a past history of prostate cancer and a more recent history of bladder cancer. [name] is his urologist. Since last visit he has been feeling well .  When we last saw him [place]  However he states that the events to many doctors visits scheduled that they never got to the beach.  He is disappointed in that. From the cardiac standpoint he has not been experiencing chest pain or shortness of breath.  We reviewed his lab work which is  satisfactory.  January 04, 2016:  Seen for the first time today .  Transfer from 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 31, 2018:   Robert Bright is seen back today for his HTN and hyperlipidemia .    Had a skin cancer removed from his nose .  Also has had a bladder cancer removed.   April 17, 2018:  Robert Bright is seen today ( with daughter, [name])   for follow-up of his hypertension and hyperlipidemia. He was seen by Vin  in May, 2019. Has been seen by Dr. Johnney Ou ( nephrology )  She stopped the Diovan several weeks ago . BP has been a bit elevated ( by his BP cuff)  Eats at [employer] almost every day  Still eats a very salty diet.  Eats [employer] sandwiches 3-4 times a week.  He has chicken noodle soup almost every evening.   Nov 12, 2018      Chief Complaint:  HTN   Robert Bright is a 85 y.o. male with hx of HTN. Overall doing well  No CP or dyspnea.   BP and HR are well controlled  Has cut out most of his dietary salt.   The patient does  not have symptoms concerning for COVID-19 infection (fever, chills, cough, or new shortness of breath).    Past Medical History:  Diagnosis Date  . Anxiety   . Bladder cancer [facility]) 1993   Being followed by [name]  . ED (erectile dysfunction)   . GERD (gastroesophageal reflux disease)   . Heart murmur   . Hypercholesterolemia   . Hyperlipidemia   . Hypertension    with a component of whitecoat syndrome  . Prostate cancer ([place]) 1993   Hx ostatectomyof remote treated with radial pr  . Shingles   . Stress incontinence   . Ureteral stricture   . Vertigo    Past Surgical History:  Procedure Laterality Date  . BLADDER TUMOR EXCISION    . CARDIOVASCULAR STRESS TEST  01-02-2003   EF  68%  . COLONOSCOPY    . CYSTOSCOPY    . CYSTOSCOPY W/ RETROGRADES Bilateral 04/24/2016   Procedure: CYSTOSCOPY WITH BILATERAL RETROGRADE PYELOGRAM;  Surgeon: Raynelle Bring, MD;  Location: [facility];  Service: Urology;  Laterality: Bilateral;  . CYSTOSTOMY W/ BLADDER BIOPSY    . EYE SURGERY    . INGUINAL HERNIA REPAIR    . LYMPHADENECTOMY    . PROSTATECTOMY  1993  . TRANSURETHRAL RESECTION OF BLADDER TUMOR N/A 04/24/2016   Procedure: TRANSURETHRAL RESECTION OF BLADDER TUMOR (TURBT);  Surgeon: Raynelle Bring, MD;  Location: [facility];  Service: Urology;  Laterality: N/A;     Current Meds  Medication Sig  . hydrochlorothiazide (HYDRODIURIL) 25 MG tablet Take 1 tablet (25 mg total) by mouth daily.  [name] LIPITOR 20 MG tablet TAKE 1 TABLET(20 MG) BY MOUTH DAILY  . LORazepam (ATIVAN) 0.5 MG tablet TAKE 1 TABLET BY MOUTH ONCE DAILY AS NEEDED FOR ANXIETY  . NEOMYCIN-POLYMYXIN-HYDROCORTISONE (CORTISPORIN) 1 % SOLN OTIC solution Place 3 drops into the left ear 4 (four) times daily.  . NORVASC 5 MG tablet TAKE 1 TABLET BY MOUTH DAILY  . potassium chloride SA (K-DUR,KLOR-CON) 20 MEQ tablet Take 1 tablet (20 mEq total) by mouth daily.  [name] VIAGRA 50 MG tablet Take 1 tablet (50 mg total) by mouth daily as needed for erectile dysfunction.     Allergies:   Ramipril   Social History   Tobacco Use  . Smoking status: Former Smoker    Last attempt to quit: 01/16/1986    Years since quitting: 32.8  . Smokeless tobacco: Never Used  Substance Use Topics  . Alcohol use: No  . Drug use: No     Family Hx: The patient's family history includes Arthritis/Rheumatoid in his father; Dementia in his mother; Healthy in his sister and sister.  ROS:   Please see the history of present illness.     All other systems reviewed and are negative.   Prior CV studies:   The following studies were reviewed today:    Labs/Other Tests and Data Reviewed:    EKG:  No ECG reviewed.  Recent Labs: 02/25/2018: ALT 15;  Hemoglobin 13.8; Platelets 188 03/05/2018: BUN 35; Creat 2.41; Potassium 4.1; Sodium 137   Recent Lipid Panel Lab Results  Component Value Date/Time   CHOL 142 02/25/2018 11:29 AM   CHOL 255 (H) 10/17/2017 11:01 AM   TRIG 114 02/25/2018 11:29 AM   HDL 53 02/25/2018 11:29 AM   HDL 96 10/17/2017 11:01 AM   CHOLHDL 2.7 02/25/2018 11:29 AM   LDLCALC 69 02/25/2018 11:29 AM    Wt Readings from Last 3 Encounters:  11/12/18 163 lb (73.9 kg)  04/17/18 168 lb 6.4 oz (76.4 kg)  03/05/18 175 lb (79.4 kg)     Objective:    Vital Signs:  BP 132/84 (BP Location: Left Arm, Patient Position: Sitting, Cuff Size: Normal)   Pulse 70   Temp 98 F (36.7 C)   Ht 5\' 9"  (1.753 m)   Wt 163 lb (73.9 kg)   BMI 24.07 kg/m    No exam except for VS   ASSESSMENT & PLAN:     1.  HTN:   BP is well controlled.  Is limiting his salt intake .  Feeling well     COVID-19 Education: The signs and symptoms of COVID-19 were discussed with the patient and how to seek care for testing (follow up with PCP or arrange E-visit).  The importance of social distancing was discussed today.  Time:   Today, I have spent  20  minutes with the patient with telehealth technology discussing the above problems.     Medication Adjustments/Labs and Tests Ordered: Current medicines are reviewed at length with the patient today.  Concerns regarding medicines are outlined above.   Tests Ordered: No orders of the defined types were placed in this encounter.   Medication Changes: No orders of the defined types were placed in this encounter.   Disposition:  Follow up in 6 month(s)  Signed, Mertie Moores, MD  11/12/2018 3:30 PM    [place]

## 2018-11-12 NOTE — Patient Instructions (Signed)

## 2019-02-08 ENCOUNTER — Other Ambulatory Visit: Payer: Self-pay | Admitting: Cardiovascular Disease

## 2019-02-13 ENCOUNTER — Other Ambulatory Visit: Payer: Self-pay | Admitting: Cardiovascular Disease

## 2019-03-06 ENCOUNTER — Other Ambulatory Visit: Payer: Self-pay

## 2019-03-06 ENCOUNTER — Other Ambulatory Visit: Payer: Medicare Other | Admitting: Internal Medicine

## 2019-03-06 DIAGNOSIS — F411 Generalized anxiety disorder: Secondary | ICD-10-CM

## 2019-03-06 DIAGNOSIS — Z0000 Encounter for general adult medical examination without abnormal findings: Secondary | ICD-10-CM

## 2019-03-06 DIAGNOSIS — I10 Essential (primary) hypertension: Secondary | ICD-10-CM

## 2019-03-06 DIAGNOSIS — I1311 Hypertensive heart and chronic kidney disease without heart failure, with stage 5 chronic kidney disease, or end stage renal disease: Secondary | ICD-10-CM

## 2019-03-06 LAB — COMPLETE METABOLIC PANEL WITH GFR
AG Ratio: 1.4 (calc) (ref 1.0–2.5)
ALT: 18 U/L (ref 9–46)
AST: 18 U/L (ref 10–35)
Albumin: 4.3 g/dL (ref 3.6–5.1)
Alkaline phosphatase (APISO): 56 U/L (ref 35–144)
BUN/Creatinine Ratio: 16 (calc) (ref 6–22)
BUN: 29 mg/dL — ABNORMAL HIGH (ref 7–25)
CO2: 26 mmol/L (ref 20–32)
Calcium: 9.7 mg/dL (ref 8.6–10.3)
Chloride: 102 mmol/L (ref 98–110)
Creat: 1.79 mg/dL — ABNORMAL HIGH (ref 0.70–1.11)
GFR, Est African American: 39 mL/min/{1.73_m2} — ABNORMAL LOW (ref >=60)
GFR, Est Non African American: 34 mL/min/{1.73_m2} — ABNORMAL LOW (ref >=60)
Globulin: 3 g/dL (calc) (ref 1.9–3.7)
Glucose, Bld: 87 mg/dL (ref 65–99)
Potassium: 3.6 mmol/L (ref 3.5–5.3)
Sodium: 139 mmol/L (ref 135–146)
Total Bilirubin: 0.7 mg/dL (ref 0.2–1.2)
Total Protein: 7.3 g/dL (ref 6.1–8.1)

## 2019-03-06 LAB — CBC WITH DIFFERENTIAL/PLATELET
Absolute Monocytes: 836 cells/uL (ref 200–950)
Basophils Absolute: 44 cells/uL (ref 0–200)
Basophils Relative: 0.5 %
Eosinophils Absolute: 343 cells/uL (ref 15–500)
Eosinophils Relative: 3.9 %
HCT: 43.5 % (ref 38.5–50.0)
Hemoglobin: 14.9 g/dL (ref 13.2–17.1)
Lymphs Abs: 2517 cells/uL (ref 850–3900)
MCH: 31 pg (ref 27.0–33.0)
MCHC: 34.3 g/dL (ref 32.0–36.0)
MCV: 90.6 fL (ref 80.0–100.0)
MPV: 11.5 fL (ref 7.5–12.5)
Monocytes Relative: 9.5 %
Neutro Abs: 5060 cells/uL (ref 1500–7800)
Neutrophils Relative %: 57.5 %
Platelets: 229 10*3/uL (ref 140–400)
RBC: 4.8 10*6/uL (ref 4.20–5.80)
RDW: 12.5 % (ref 11.0–15.0)
Total Lymphocyte: 28.6 %
WBC: 8.8 10*3/uL (ref 3.8–10.8)

## 2019-03-06 LAB — LIPID PANEL
Cholesterol: 156 mg/dL (ref <200)
HDL: 56 mg/dL (ref >=40)
LDL Cholesterol (Calc): 75 mg/dL (calc)
Non-HDL Cholesterol (Calc): 100 mg/dL (calc) (ref <130)
Total CHOL/HDL Ratio: 2.8 (calc) (ref <5.0)
Triglycerides: 155 mg/dL — ABNORMAL HIGH (ref <150)

## 2019-03-06 LAB — PSA: PSA: <0.1 ng/mL (ref <=4.0)

## 2019-03-12 ENCOUNTER — Other Ambulatory Visit: Payer: Self-pay | Admitting: Physician Assistant

## 2019-03-13 ENCOUNTER — Ambulatory Visit (INDEPENDENT_AMBULATORY_CARE_PROVIDER_SITE_OTHER): Payer: Medicare Other | Admitting: Internal Medicine

## 2019-03-13 ENCOUNTER — Other Ambulatory Visit: Payer: Self-pay

## 2019-03-13 ENCOUNTER — Encounter: Payer: Self-pay | Admitting: Internal Medicine

## 2019-03-13 VITALS — BP 120/80 | HR 78 | Temp 98.4°F | Ht 67.0 in | Wt 168.0 lb

## 2019-03-13 DIAGNOSIS — Z8639 Personal history of other endocrine, nutritional and metabolic disease: Secondary | ICD-10-CM

## 2019-03-13 DIAGNOSIS — Z8546 Personal history of malignant neoplasm of prostate: Secondary | ICD-10-CM

## 2019-03-13 DIAGNOSIS — Z8551 Personal history of malignant neoplasm of bladder: Secondary | ICD-10-CM | POA: Diagnosis not present

## 2019-03-13 DIAGNOSIS — I119 Hypertensive heart disease without heart failure: Secondary | ICD-10-CM

## 2019-03-13 DIAGNOSIS — N183 Chronic kidney disease, stage 3 unspecified: Secondary | ICD-10-CM

## 2019-03-13 DIAGNOSIS — R7989 Other specified abnormal findings of blood chemistry: Secondary | ICD-10-CM | POA: Diagnosis not present

## 2019-03-13 DIAGNOSIS — I10 Essential (primary) hypertension: Secondary | ICD-10-CM | POA: Diagnosis not present

## 2019-03-13 DIAGNOSIS — Z0000 Encounter for general adult medical examination without abnormal findings: Secondary | ICD-10-CM

## 2019-03-13 MED ORDER — POTASSIUM CHLORIDE CRYS ER 20 MEQ PO TBCR: 20.0000 meq | EXTENDED_RELEASE_TABLET | Freq: Every day | ORAL | 3 refills | Status: DC

## 2019-03-16 NOTE — Progress Notes (Signed)
Subjective:    Patient ID: Robert Bright, male    DOB: Jan 06, 1931, 85 y.o.   MRN: [id number]  HPI 85 year old Male for health maintenance exam, medicare wellness and evaluation of medical issues. Daughter recently has moved back here. His wife has dementia. They reside in their own home with assistance.   He used to be followed by [name], cardiologist, who retired and then patient started coming here for routine health maintenance in January 2018.  He was found to have prostate cancer in 1993 treated with radical retropubic prostatectomy by Dr. Laurence Ferrari.  His PSA has subsequently been undetectable.  He is now followed by [name].  He has been diagnosed with cancer of the bladder wall.  He sees [name] for cystoscopy surveillance.  Noted tumor was taken in 2018.  History of bladder neck contracture and urethral stricture.  He underwent balloon dilatation of his bladder neck April 2009 which is made office cystoscopy possible.  He required repeat dilatation February 2010.  He was noted to have a small membranous urethral stricture March 2013 requiring dilatation and intermittently since then.  He had stress incontinence after dilatation of bladder neck in 2009 which has improved.  Longstanding history of hyperlipidemia and hypertension.  History of right bundle branch block.  He is maintained on statin therapy.  Herpes zoster affecting left face orbit and scalp in 2014.  Social history: He is married.  Wife has dementia.  Wife used to be a Pharmacist, hospital.  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  His daughter, Truman Hayward is primary contact and her phone number is [phone].  He has 1 son and 2 daughters.  Family history: Father died at age 4 with complications of rheumatoid arthritis.  Mother died at age 5 from complications of surgery.  2 sisters ages 45 and 75 in good health.  Mother had dementia.  Dr. Carlean Purl saw him in 2014 regarding GE reflux  and he was treated with lansoprazole.  At that time he was drinking 6 cups of coffee daily.  He has cavus deformity of left foot seen by Dr. Paulla Dolly, podiatrist.  He is intolerant of grandma Prill.  He is followed by Dr. Acie Fredrickson, cardiologist.  History of anxiety treated with Ativan as needed.  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  Was referred to [facility] for elevated serum creatinine.  Thought to be due to ARB and hypertension.  Diagnosed with stage III chronic kidney disease.  Creatinine improved after ARB was discontinued       Review of Systems history of tinnitus and some urinary incontinence     Objective:   Physical Exam Vitals signs reviewed.  Constitutional:      General: He is not in acute distress.    Appearance: Normal appearance. He is normal weight. He is not diaphoretic.  HENT:     Head: Normocephalic and atraumatic.     Right Ear: Tympanic membrane and ear canal normal.     Left Ear: Tympanic membrane and ear canal normal.     Nose: Nose normal.     Mouth/Throat:     Mouth: Mucous membranes are moist.     Pharynx: Oropharynx is clear.  Eyes:     General: No scleral icterus.       Left eye: No discharge.     Extraocular Movements: Extraocular movements intact.     Conjunctiva/sclera: Conjunctivae normal.  Pupils: Pupils are equal, round, and reactive to light.  Neck:     Musculoskeletal: Neck supple. No neck rigidity.  Cardiovascular:     Rate and Rhythm: Normal rate and regular rhythm.     Pulses: Normal pulses.     Heart sounds: Normal heart sounds. No murmur.  Pulmonary:     Effort: Pulmonary effort is normal.     Breath sounds: Normal breath sounds. No wheezing or rales.  Abdominal:     General: Bowel sounds are normal. There is no distension.     Palpations: Abdomen is soft. There is no mass.     Tenderness: There is no abdominal tenderness. There is no guarding or rebound.     Hernia: No hernia is  present.  Genitourinary:    Comments: Deferred to urologist Musculoskeletal:     Right lower leg: No edema.     Left lower leg: No edema.  Lymphadenopathy:     Cervical: No cervical adenopathy.  Skin:    General: Skin is warm and dry.  Neurological:     General: No focal deficit present.     Mental Status: He is alert and oriented to person, place, and time.     Cranial Nerves: No cranial nerve deficit.     Motor: No weakness.     Coordination: Coordination normal.     Deep Tendon Reflexes: Reflexes normal.  Psychiatric:        Mood and Affect: Mood normal.        Behavior: Behavior normal.        Thought Content: Thought content normal.        Judgment: Judgment normal.           Assessment & Plan:  Remote history of prostate cancer status post prostatectomy.  PSA is less than 0.1  History of bladder cancer followed by [name]  Essential hypertension stable on current regimen  Hyperlipidemia-lipid panel is normal the exception of very slightly elevated triglycerides at 155 less than 150 is normal.  Bilateral renal cyst  Anxiety  History of elevated serum creatinine.  Last year his creatinine was 2.41 and we sent him to [facility].  This urine is 1.79.  Estimated GFR 34 cc/min.  His ARB was discontinued by nephrologist and creatinine improved.  Thought to have stage III chronic kidney disease  All in all I think he is doing very well.  I think it must be difficult for him to deal with wife's illness but he does a remarkable job; however her dementia seems to be stable at this time.  She requires assistance with showering and he does have help for that.  They eat out some and they do the best they can during the pandemic.  Plan: He will return in 1 year.  Subjective:   Patient presents for Medicare Annual/Subsequent preventive examination.  Review Past Medical/Family/Social: See above   Risk Factors  Current exercise habits: Walks some Dietary  issues discussed: Eats out a fair amount  Cardiac risk factors:  Depression Screen  (Note: if answer to either of the following is "Yes", a more complete depression screening is indicated)   Over the past two weeks, have you felt down, depressed or hopeless? No  Over the past two weeks, have you felt little interest or pleasure in doing things? No Have you lost interest or pleasure in daily life? No Do you often feel hopeless? No Do you cry easily over simple problems? No  Activities of Daily Living  In your present state of health, do you have any difficulty performing the following activities?:   Driving? No  Managing money? No  Feeding yourself? No  Getting from bed to chair? No  Climbing a flight of stairs? No  Preparing food and eating?: No  Bathing or showering? No  Getting dressed: No  Getting to the toilet? No  Using the toilet:No  Moving around from place to place: No  In the past year have you fallen or had a near fall?:No  Are you sexually active? No  Do you have more than one partner? No   Hearing Difficulties: No  Do you often ask people to speak up or repeat themselves? No  Do you experience ringing or noises in your ears?  Yes Do you have difficulty understanding soft or whispered voices? No  Do you feel that you have a problem with memory? No Do you often misplace items? No    Home Safety:  Do you have a smoke alarm at your residence? Yes Do you have grab bars in the bathroom?  Yes Do you have throw rugs in your house?  Yes   Cognitive Testing  Alert? Yes Normal Appearance?Yes  Oriented to person? Yes Place? Yes  Time? Yes  Recall of three objects? Yes  Can perform simple calculations? Yes  Displays appropriate judgment?Yes  Can read the correct time from a watch face?Yes   List the Names of Other Physician/Practitioners you currently use:  See referral list for the physicians patient is currently seeing.  Cardiologist  Urologist   Podiatrist   Review of Systems: See above-no new complaints   Objective:     General appearance: Appears stated age  Head: Normocephalic, without obvious abnormality, atraumatic  Eyes: conj clear, EOMi PEERLA  Ears: normal TM's and external ear canals both ears  Nose: Nares normal. Septum midline. Mucosa normal. No drainage or sinus tenderness.  Throat: lips, mucosa, and tongue normal; teeth and gums normal  Neck: no adenopathy, no carotid bruit, no JVD, supple, symmetrical, trachea midline and thyroid not enlarged, symmetric, no tenderness/mass/nodules  No CVA tenderness.  Lungs: clear to auscultation bilaterally  Breasts: normal appearance, no masses or tenderness Heart: regular rate and rhythm, S1, S2 normal, no murmur, click, rub or gallop  Abdomen: soft, non-tender; bowel sounds normal; no masses, no organomegaly  Musculoskeletal: ROM normal in all joints, no crepitus, no deformity, Normal muscle strengthen. Back  is symmetric, no curvature. Skin: Skin color, texture, turgor normal. No rashes or lesions  Lymph nodes: Cervical, supraclavicular, and axillary nodes normal.  Neurologic: CN 2 -12 Normal, Normal symmetric reflexes. Normal coordination and gait  Psych: Alert & Oriented x 3, Mood appear stable.    Assessment:    Annual wellness medicare exam   Plan:    During the course of the visit the patient was educated and counseled about appropriate screening and preventive services including:   Flu vaccination     Patient Instructions (the written plan) was given to the patient.  Medicare Attestation  I have personally reviewed:  The patient's medical and social history  Their use of alcohol, tobacco or illicit drugs  Their current medications and supplements  The patient's functional ability including ADLs,fall risks, home safety risks, cognitive, and hearing and visual impairment  Diet and physical activities  Evidence for depression or mood disorders  The  patient's weight, height, BMI, and visual acuity have been recorded in the chart. I have made  referrals, counseling, and provided education to the patient based on review of the above and I have provided the patient with a written personalized care plan for preventive services.

## 2019-03-19 NOTE — Patient Instructions (Signed)
It was a pleasure to see you today.  Continue current medications and follow-up in 1 year or as needed.

## 2019-05-12 NOTE — Progress Notes (Signed)
Cardiology Office Note   Date:  05/13/2019   ID:  Robert Bright, DOB 01-23-1931, MRN [id number]  PCP:  Robert Showers, MD  Cardiologist:  Previous Robert Coco MD,  Now Robert Bright   Chief Complaint  Patient presents with  . Hypertension      Notes from Dr. Liborio Bright is a 85 y.o. male who presents for scheduled 6 month follow-up visit.  He is a retired IT trainer.[name] He has a history of essential hypertension and a history of hypercholesterolemia. He does not have any history of chest pain or ischemic heart disease. He had a normal nuclear stress test in 2004. He does have a past history of prostate cancer and a more recent history of bladder cancer. Dr. Alinda Bright is his urologist. Since last visit he has been feeling well .  When we last saw him [place]  However he states that the events to many doctors visits scheduled that they never got to the beach.  He is disappointed in that. From the cardiac standpoint he has not been experiencing chest pain or shortness of breath.  We reviewed his lab work which is satisfactory.  January 04, 2016:  Seen for the first time today .  Transfer from Dr. 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 31, 2018:   Robert Bright is seen back today for his HTN and hyperlipidemia .    Had a skin cancer removed from his nose .  Also has had a bladder cancer removed.   April 17, 2018:  Robert Bright is seen today ( with daughter, Robert Bright)   for follow-up of his hypertension and hyperlipidemia. He was seen by Vin  in May, 2019. Has been seen by Dr. Johnney Bright ( nephrology )  She stopped the Diovan several weeks ago . BP has been a bit elevated ( by his BP cuff)  Eats  at [employer] almost every day  Still eats a very salty diet.  Eats [employer] sandwiches 3-4 times a week.  He has chicken noodle soup almost every evening.  Nov. 24, 2020 Seen with daughter Robert Bright is seen today for follow-up of his hypertension and hyperlipidemia.  He also has chronic kidney disease.  I saw him several years ago.    Duaghter is Robert Bright .   Is a [place] graduate .   We discuss the [employer]  / [employer] rivalry .     Past Medical History:  Diagnosis Date  . Anxiety   . Bladder cancer [facility]) 1993   Being followed by Dr. Alinda Bright  . ED (erectile dysfunction)   . GERD (gastroesophageal reflux disease)   . Heart murmur   . Hypercholesterolemia   . Hyperlipidemia   . Hypertension    with a component of whitecoat syndrome  . Prostate cancer ([place]) 1993   Hx ostatectomyof remote treated with radial pr  . Shingles   . Stress incontinence   . Ureteral stricture   . Vertigo     Past Surgical History:  Procedure Laterality Date  . BLADDER TUMOR EXCISION    . CARDIOVASCULAR STRESS TEST  01-02-2003  EF 68%  . COLONOSCOPY    . CYSTOSCOPY    . CYSTOSCOPY W/ RETROGRADES Bilateral 04/24/2016   Procedure: CYSTOSCOPY WITH BILATERAL RETROGRADE PYELOGRAM;  Surgeon: Raynelle Bring, MD;  Location: [facility];  Service: Urology;  Laterality: Bilateral;  . CYSTOSTOMY W/ BLADDER BIOPSY    . EYE SURGERY    . INGUINAL HERNIA REPAIR    . LYMPHADENECTOMY    . PROSTATECTOMY  1993  . TRANSURETHRAL RESECTION OF BLADDER TUMOR N/A 04/24/2016   Procedure: TRANSURETHRAL RESECTION OF BLADDER TUMOR (TURBT);  Surgeon: Raynelle Bring, MD;  Location: [facility];  Service: Urology;  Laterality: N/A;     Current Outpatient Medications  Medication Sig Dispense Refill  . hydrochlorothiazide (HYDRODIURIL) 25 MG tablet Take 1 tablet (25 mg total) by mouth daily. 90 tablet 3  . LIPITOR 20 MG tablet TAKE 1 TABLET(20 MG) BY MOUTH DAILY 90 tablet 1  . LORazepam (ATIVAN) 0.5 MG tablet TAKE 1  TABLET BY MOUTH ONCE DAILY AS NEEDED FOR ANXIETY 30 tablet 0  . NEOMYCIN-POLYMYXIN-HYDROCORTISONE (CORTISPORIN) 1 % SOLN OTIC solution Place 3 drops into the left ear 4 (four) times daily. 10 mL 1  . NORVASC 5 MG tablet TAKE 1 TABLET BY MOUTH DAILY 90 tablet 2  . potassium chloride SA (K-DUR) 20 MEQ tablet Take 1 tablet (20 mEq total) by mouth daily. 90 tablet 3  . VIAGRA 50 MG tablet Take 1 tablet (50 mg total) by mouth daily as needed for erectile dysfunction. 10 tablet 1   No current facility-administered medications for this visit.     Allergies:   Ramipril    Social History:  The patient  reports that he quit smoking about 33 years ago. He has never used smokeless tobacco. He reports that he does not drink alcohol or use drugs.   Family History:  The patient's family history includes Arthritis/Rheumatoid in his father; Dementia in his mother; Healthy in his sister and sister.    ROS:  Please see the history of present illness.   Otherwise, review of systems are positive for none.   All other systems are reviewed and negative.    Physical Exam: Blood pressure 128/80, pulse 69, height 5\' 10"  (1.778 m), weight 169 lb 12.8 oz (77 kg), SpO2 97 %.  GEN:  Well nourished, well developed in no acute distress HEENT: Normal NECK: No JVD; No carotid bruits LYMPHATICS: No lymphadenopathy CARDIAC: RRR , no murmurs, rubs, gallops RESPIRATORY:  Clear to auscultation without rales, wheezing or rhonchi  ABDOMEN: Soft, non-tender, non-distended MUSCULOSKELETAL:  No edema; No deformity  SKIN: Warm and dry NEUROLOGIC:  Alert and oriented x 3    EKG:   May 13, 2019: Normal sinus rhythm with first-degree AV block.  Heart rate is 69.  Right bundle branch block.  No changes from previous EKG.    Recent Labs: 03/06/2019: ALT 18; BUN 29; Creat 1.79; Hemoglobin 14.9; Platelets 229; Potassium 3.6; Sodium 139    Lipid Panel    Component Value Date/Time   CHOL 156 03/06/2019 0945   CHOL 255  (H) 10/17/2017 1101   TRIG 155 (H) 03/06/2019 0945   HDL 56 03/06/2019 0945   HDL 96 10/17/2017 1101   CHOLHDL 2.8 03/06/2019 0945   VLDL 25 06/26/2016 1113   LDLCALC 75 03/06/2019 0945      Wt Readings from Last 3 Encounters:  05/13/19 169 lb 12.8 oz (77 kg)  03/13/19 168 lb (76.2 kg)  11/12/18 163 lb (73.9 kg)  ASSESSMENT AND PLAN:  1. essential hypertension:   BP seems to be well controlled.   2. Hyperlipidemia -  Lipids have been stable.   LDL is 75.   Cont meds    3. right bundle branch block  - stable    4. Sinus bradycardia:   Stable,  No syncope    Current medicines are reviewed at length with the patient today.  The patient does not have concerns regarding medicines.  The following changes have been made:  no change  Labs/ tests ordered today include:   Orders Placed This Encounter  Procedures  . EKG 12-Lead   Follow-up with Vin  in 6 months.  I will see him in 1 year.   Mertie Moores, MD  05/13/2019 4:56 PM    [address],  [place], [place]  [id number] Pager [phone] Phone: [phone]; Fax: [phone]

## 2019-05-13 ENCOUNTER — Other Ambulatory Visit: Payer: Self-pay

## 2019-05-13 ENCOUNTER — Encounter: Payer: Self-pay | Admitting: Cardiovascular Disease

## 2019-05-13 ENCOUNTER — Ambulatory Visit: Payer: Medicare Other | Admitting: Cardiovascular Disease

## 2019-05-13 VITALS — BP 128/80 | HR 69 | Ht 70.0 in | Wt 169.8 lb

## 2019-05-13 DIAGNOSIS — I119 Hypertensive heart disease without heart failure: Secondary | ICD-10-CM | POA: Diagnosis not present

## 2019-05-13 NOTE — Patient Instructions (Signed)
Medication Instructions:  Your provider recommends that you continue on your current medications as directed. Please refer to the Current Medication list given to you today.  *If you need a refill on your cardiac medications before your next appointment, please call your pharmacy*  Follow-Up: At [facility] you and your health needs are our priority.  As part of our continuing mission to provide you with exceptional heart care, we have created designated Provider Care Teams.  These Care Teams include your primary Cardiologist (physician) and Advanced Practice Providers (APPs -  Physician Assistants and Nurse Practitioners) who all work together to provide you with the care you need, when you need it.  Your next appointment:   12 month(s) The format for your next appointment:   In Person Provider:   You may see Mertie Moores, MD or one of the following Advanced Practice Providers on your designated Care Team:  Richardson Dopp, PA-C  [place], [place]  Daune Perch, [place]

## 2019-07-01 ENCOUNTER — Other Ambulatory Visit: Payer: Self-pay | Admitting: Cardiovascular Disease

## 2019-07-09 ENCOUNTER — Ambulatory Visit: Payer: Medicare Other | Attending: Anesthesiology

## 2019-07-09 DIAGNOSIS — Z23 Encounter for immunization: Secondary | ICD-10-CM | POA: Insufficient documentation

## 2019-07-09 NOTE — Progress Notes (Signed)
   Covid-19 Vaccination Clinic  Name:  LB MAUGHAN    MRN: [id number] DOB: [birth year]  07/09/2019  Mr. Bligh was observed post Covid-19 immunization for 15 minutes without incidence. He was provided with Vaccine Information Sheet and instruction to access the V-Safe system.   Mr. Matamoros was instructed to call 911 with any severe reactions post vaccine: [name] Difficulty breathing  . Swelling of your face and throat  . A fast heartbeat  . A bad rash all over your body  . Dizziness and weakness    Immunizations Administered    Name Date Dose VIS Date Route   Pfizer COVID-19 Vaccine 07/09/2019  1:04 PM 0.3 mL 05/30/2019 Intramuscular   Manufacturer: [place]   Lot: [id number]   [place]: [id number]

## 2019-07-10 ENCOUNTER — Ambulatory Visit: Payer: Medicare Other

## 2019-07-22 ENCOUNTER — Other Ambulatory Visit: Payer: Self-pay | Admitting: Cardiovascular Disease

## 2019-07-22 MED ORDER — ATORVASTATIN CALCIUM 20 MG PO TABS: ORAL_TABLET | ORAL | 3 refills | Status: DC

## 2019-07-30 ENCOUNTER — Ambulatory Visit: Payer: Medicare Other | Attending: Internal Medicine

## 2019-07-30 DIAGNOSIS — Z23 Encounter for immunization: Secondary | ICD-10-CM | POA: Insufficient documentation

## 2019-07-30 NOTE — Progress Notes (Signed)
   Covid-19 Vaccination Clinic  Name:  Robert Bright    MRN: [id number] DOB: [birth year]  07/30/2019  Robert Bright was observed post Covid-19 immunization for 15 minutes without incidence. He was provided with Vaccine Information Sheet and instruction to access the V-Safe system.   Robert Bright was instructed to call 911 with any severe reactions post vaccine: [name] Difficulty breathing  . Swelling of your face and throat  . A fast heartbeat  . A bad rash all over your body  . Dizziness and weakness    Immunizations Administered    Name Date Dose VIS Date Route   Pfizer COVID-19 Vaccine 07/30/2019 11:35 AM 0.3 mL 05/30/2019 Intramuscular   Manufacturer: [place]   Lot: [id number]   [place]: [id number]

## 2019-08-01 ENCOUNTER — Other Ambulatory Visit: Payer: Self-pay | Admitting: Cardiovascular Disease

## 2019-08-01 MED ORDER — NORVASC 5 MG PO TABS: 5.0000 mg | ORAL_TABLET | Freq: Every day | ORAL | 2 refills | Status: DC

## 2019-08-01 NOTE — Telephone Encounter (Signed)
Pt's medication was sent to pt's pharmacy as requested. Confirmation received.  °

## 2019-08-04 ENCOUNTER — Telehealth: Payer: Self-pay | Admitting: Cardiovascular Disease

## 2019-08-04 MED ORDER — AMLODIPINE BESYLATE 5 MG PO TABS: 5.0000 mg | ORAL_TABLET | Freq: Every day | ORAL | 3 refills | Status: DC

## 2019-08-04 NOTE — Telephone Encounter (Signed)
Pt calling stating that he needs his medication Norvasc sent to his pharmacy as a generic. Pt would like Amlodipine 5 mg tablet. Please address

## 2019-08-04 NOTE — Telephone Encounter (Signed)
Amlodipine besylate has been ordered from patient's pharmacy

## 2019-08-06 ENCOUNTER — Telehealth: Payer: Self-pay | Admitting: Internal Medicine

## 2019-08-06 NOTE — Telephone Encounter (Signed)
Patient called to get generic brand for amLODipine (NORVASC) 5 MG tablet I let him know he would need to contact Dr Julious Payer office that is who fills this medication and it was filled yesterday.

## 2019-08-09 ENCOUNTER — Telehealth: Payer: Self-pay | Admitting: Internal Medicine

## 2019-08-09 ENCOUNTER — Encounter: Payer: Self-pay | Admitting: Internal Medicine

## 2019-08-09 MED ORDER — HYDROCHLOROTHIAZIDE 25 MG PO TABS: ORAL_TABLET | ORAL | 99 refills | Status: DC

## 2019-08-09 NOTE — Telephone Encounter (Signed)
Received refill request from [facility] for HCTZ 25mg  every other day. Have refilled for one year.

## 2019-10-17 IMAGING — US US RENAL
1 series · 14 of 25 positions shown · non-contrast
Comparison: CT 06/26/2005

CLINICAL DATA: Elevated creatinine

EXAM:
RENAL / URINARY TRACT ULTRASOUND COMPLETE

[Series 1: us renal · 0.23mm/px · 14 of 53 slices shown]
[im 1/53]
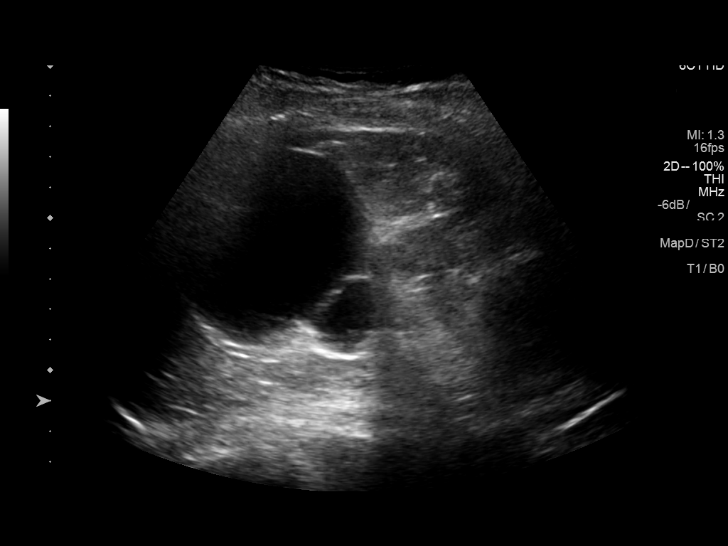
[im 5/53]
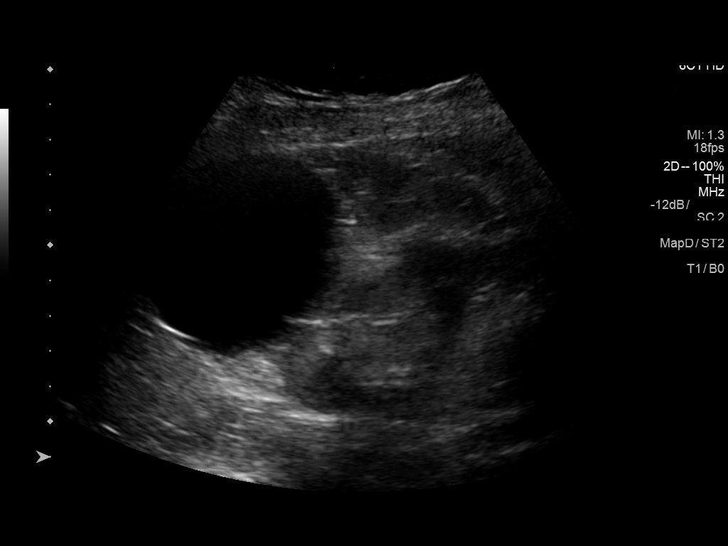
[im 9/53]
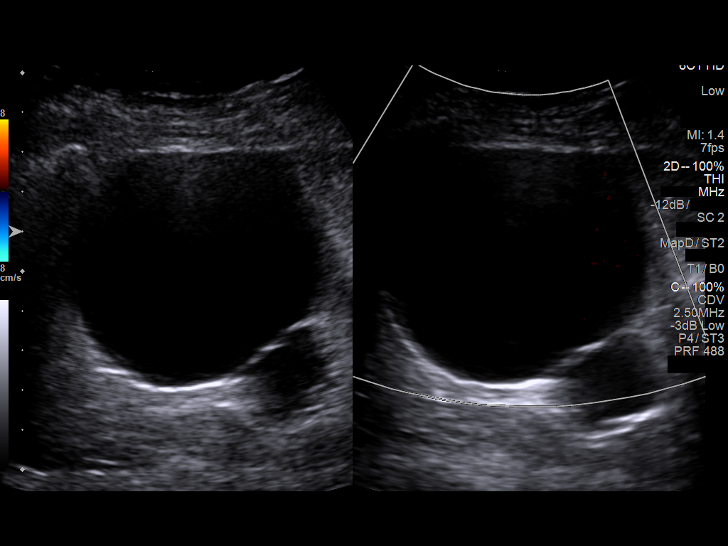
[im 14/53]
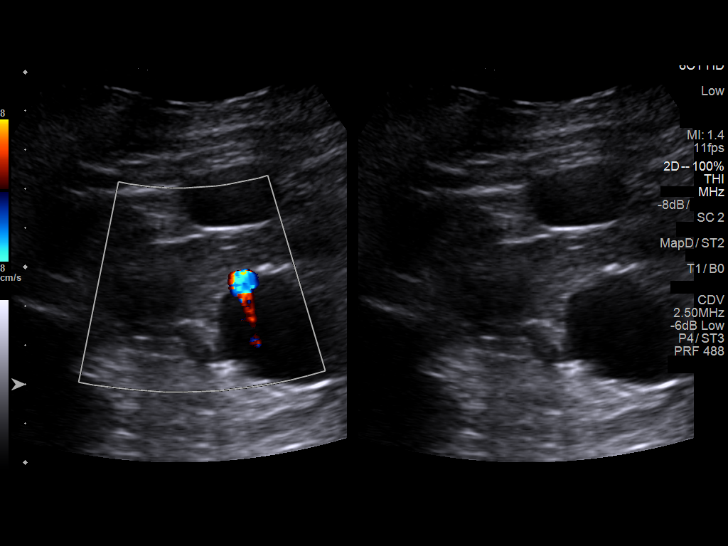
[im 18/53]
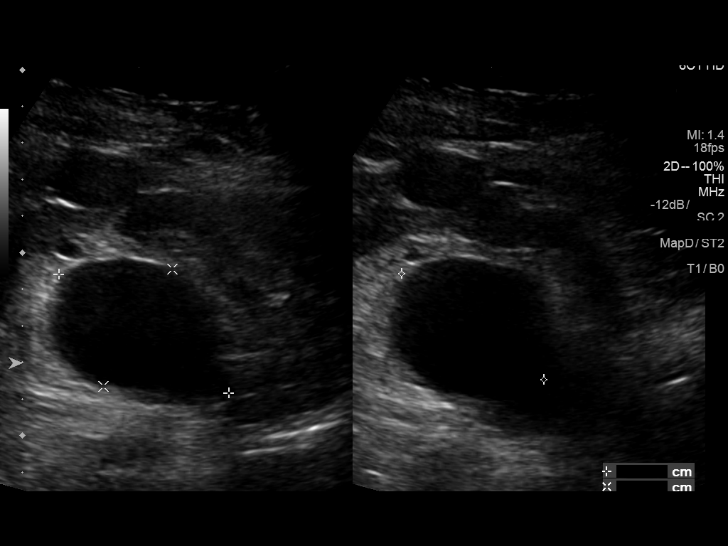
[im 20/53]
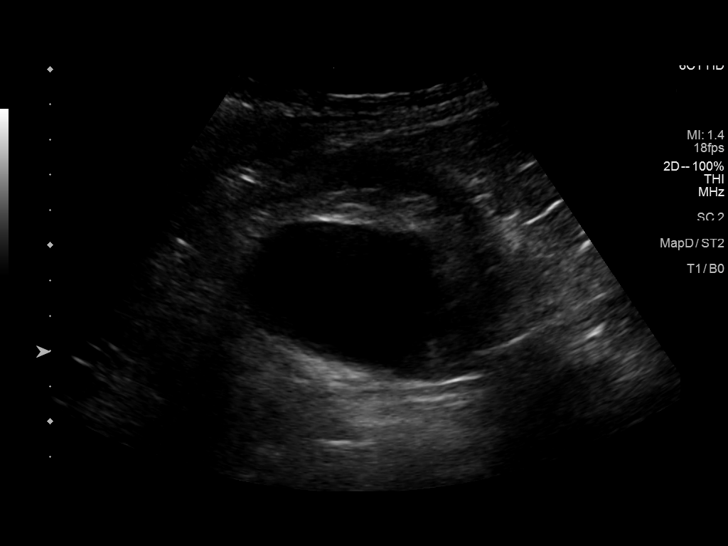
[im 24/53]
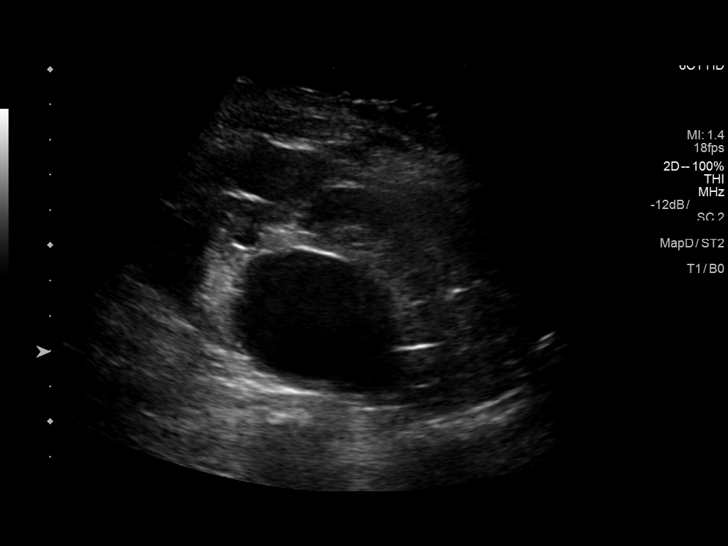
[im 29/53]
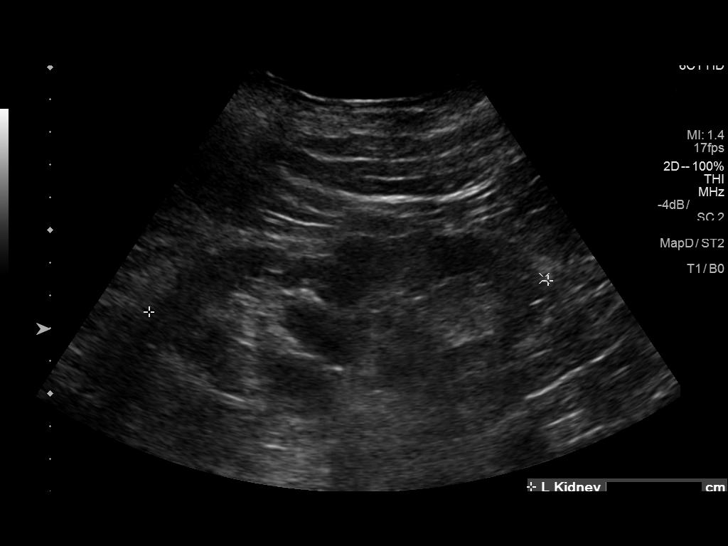
[im 33/53]
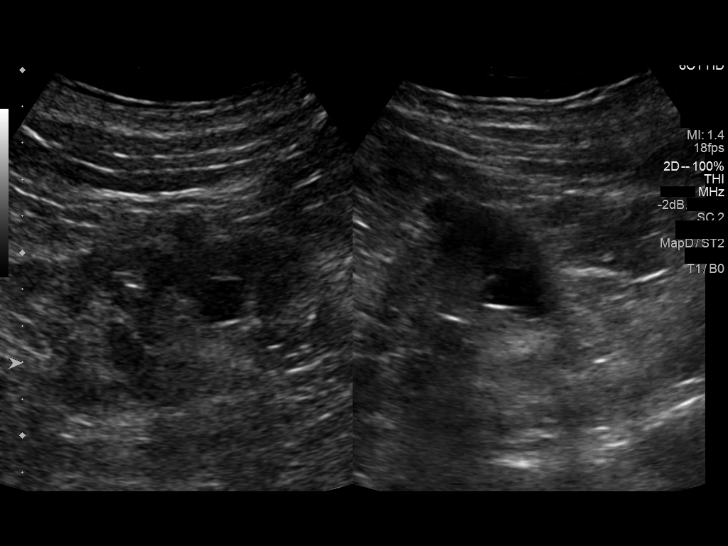
[im 35/53]
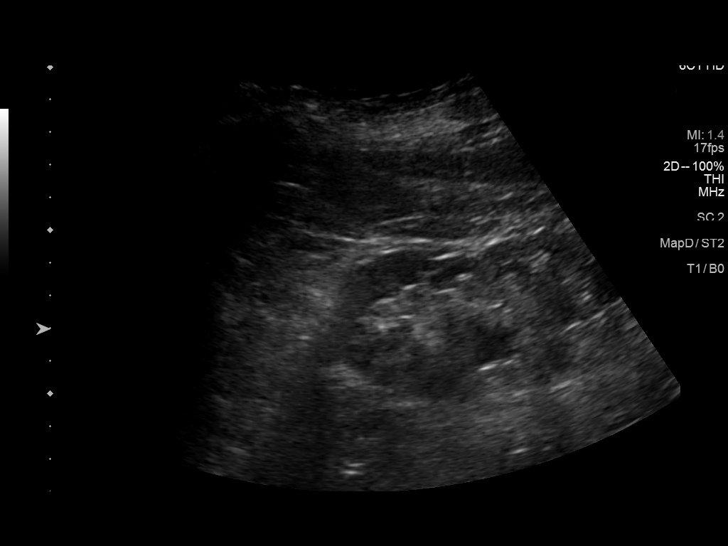
[im 40/53]
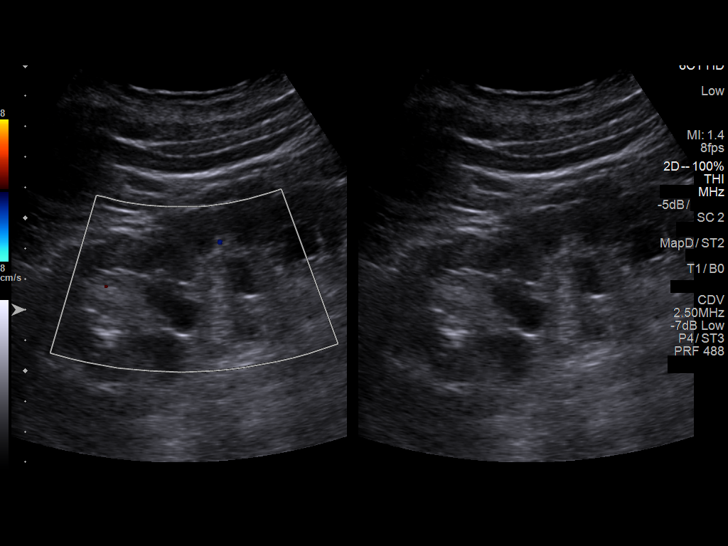
[im 44/53]
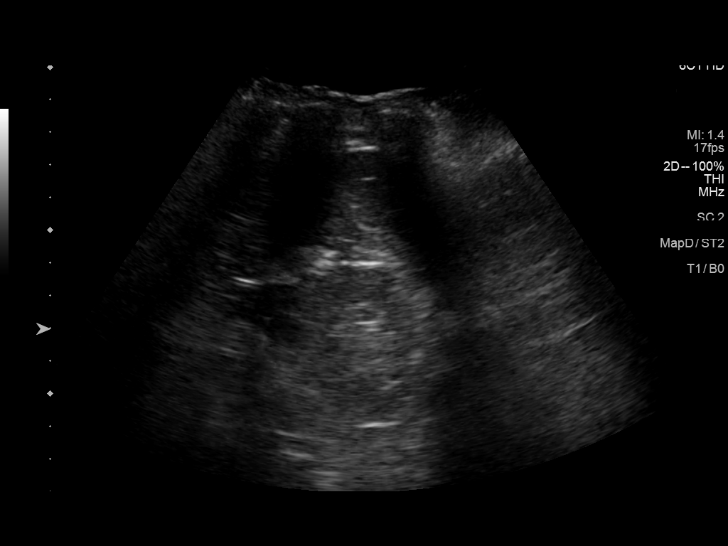
[im 48/53]
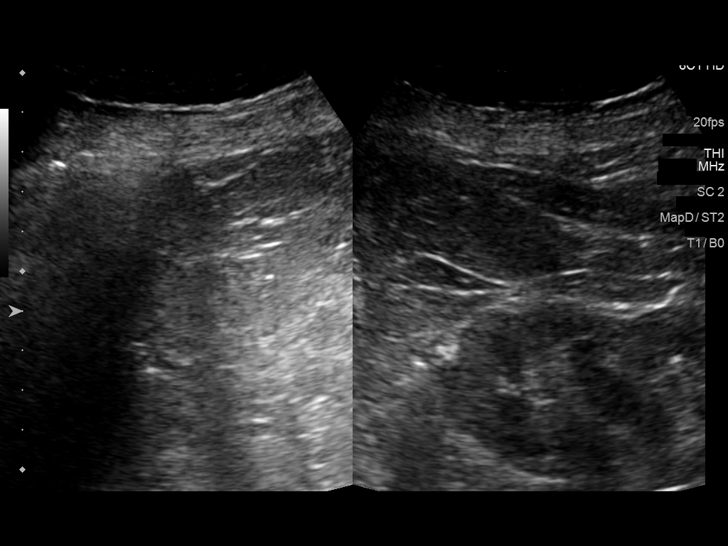
[im 53/53]
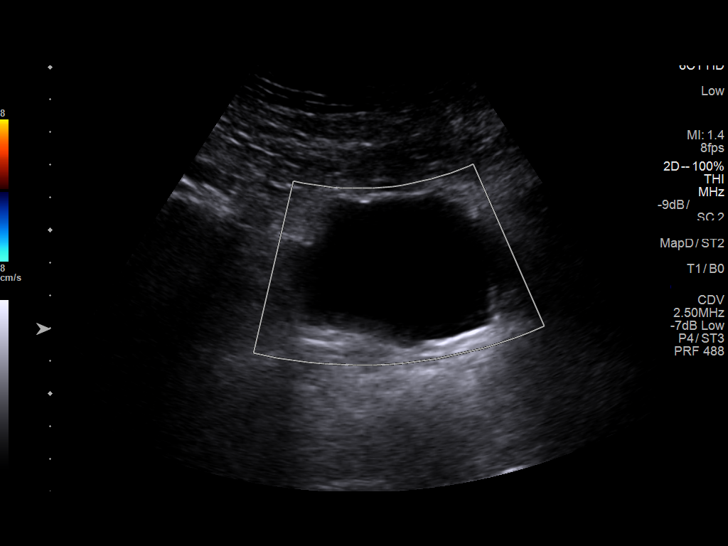

[14 of 25 positions shown; findings below may reference images not displayed]

FINDINGS: Right Kidney:

Length: 14.1 cm. Cortical echogenicity within normal limits. No
hydronephrosis. Multiple cysts within the kidney, the largest are
measured. Upper pole cyst measures 6.5 x 5.9 x 6.7 cm. Midpole cyst
measures 5.7 x 3.8 x 4.9 cm. Possible small stone in the mid right
kidney.

Left Kidney:

Length: 12.4 cm. Cortical echogenicity within normal limits. No
hydronephrosis. Multiple small cysts in the kidney. Largest is seen
in the lower pole and measures 1.3 x 1.2 x 1.6 cm. Possible small
stone in the mid left kidney.

Bladder:

Appears normal for degree of bladder distention.
IMPRESSION: 1. No hydronephrosis
2. Multiple bilateral renal cysts.
3. Possible small kidney stones

## 2019-12-03 ENCOUNTER — Other Ambulatory Visit: Payer: Self-pay

## 2019-12-03 MED ORDER — HYDROCHLOROTHIAZIDE 25 MG PO TABS: ORAL_TABLET | ORAL | 3 refills | Status: DC

## 2019-12-12 ENCOUNTER — Telehealth: Payer: Self-pay | Admitting: Internal Medicine

## 2019-12-12 NOTE — Telephone Encounter (Signed)
Need to reschedule CPE and Labs, Last years was 03/12/20 so this years needs to be after that or insurance will not pay.

## 2020-01-20 ENCOUNTER — Other Ambulatory Visit: Payer: Medicare Other | Admitting: Internal Medicine

## 2020-01-27 ENCOUNTER — Encounter: Payer: Medicare Other | Admitting: Internal Medicine

## 2020-02-18 ENCOUNTER — Telehealth (INDEPENDENT_AMBULATORY_CARE_PROVIDER_SITE_OTHER): Payer: Medicare Other | Admitting: Internal Medicine

## 2020-02-18 ENCOUNTER — Other Ambulatory Visit: Payer: Self-pay

## 2020-02-18 DIAGNOSIS — Z0289 Encounter for other administrative examinations: Secondary | ICD-10-CM

## 2020-02-18 MED ORDER — LORAZEPAM 0.5 MG PO TABS: ORAL_TABLET | ORAL | 0 refills | Status: DC

## 2020-02-18 NOTE — Telephone Encounter (Signed)
Spoke with patient in person August 30th while he was here with his wife Lelon Frohlich who has dementia and is currently staying at [facility].  He hopes she will be able to come back home soon to live permanently with assistance from home instead.  Patient has history of anxiety and is taking Ativan 0.5 mg at bedtime for insomnia/anxiety as needed.  He says he needs a refill.  He takes it sparingly and responsibly.  He is a patient in this practice.  He has a history of hypertension and hyperlipidemia.  He is retired Teaching laboratory technician.  He has remote history of prostate cancer.  He is due for health maintenance exam here in October.  We'll  E scribe  Ativan 0.5 mg tablets #30 with no refill 1 p.o. daily as needed for anxiety/insomnia.  No refill.  MJB,MD

## 2020-02-27 ENCOUNTER — Telehealth (INDEPENDENT_AMBULATORY_CARE_PROVIDER_SITE_OTHER): Payer: Medicare Other | Admitting: Internal Medicine

## 2020-02-27 ENCOUNTER — Telehealth: Payer: Self-pay | Admitting: Internal Medicine

## 2020-02-27 DIAGNOSIS — R1011 Right upper quadrant pain: Secondary | ICD-10-CM

## 2020-02-27 DIAGNOSIS — R112 Nausea with vomiting, unspecified: Secondary | ICD-10-CM

## 2020-02-27 NOTE — Telephone Encounter (Signed)
Last night about 10 and it went most of the night, pt was throwing up. He threw up about 4 times. He said he doesn't have a tempature. Pt said he feels better now and doesn't feel any pain like he did when he was throwing up. Pt said he thinks it could have been something he ate but isnt sure, He just wants to know if you think its ok for him to go ahead and take his medicine for this morning. He also wants to know if it is ok for him to drink some ensure. Please advise

## 2020-02-27 NOTE — Telephone Encounter (Signed)
Patient called the office this morning regarding multiple episodes of vomiting starting around 10 PM last night and continuing until 1 AM this morning.  He says he is afebrile and his temperature is 97.3 degrees.  He ate biscuits and gravy yesterday morning at [facility] followed by some boost will be pursued green beans and some other vegetables that he had at home.  No hematemesis.  Says he had right-sided abdominal pain which is a bit concerning for cholecystitis.  He now feels better.  Has not had any further episodes of vomiting.  However, he needs to get hydrated with clear liquids over the next few hours.  I connected with him personally by telephone, returning his call around 12 noon.  He is considering bringing his wife who has dementia home from the [facility] with help with Home Instead.  His daughter lives here in town and is supportive but works a Architect.  Patient has history of hypertension and hyperlipidemia.  He is seen by Cardiology.  He has upcoming physical exam here in October but I would like to evaluate him sooner.  He will come in next week for evaluation for possible cholecystitis.  Says he does not need Zofran for nausea.  Patient instructed to call back if symptoms return or worsen.  He is identified using 2 identifiers as Robert Bright, food is  a patient in this practice. He is agreeable to visit in this format today.  Patient is at home and I am in my office.  Time spent on telephone is 10 minutes.

## 2020-02-27 NOTE — Telephone Encounter (Signed)
Set up phone call this am

## 2020-03-03 ENCOUNTER — Ambulatory Visit: Payer: Medicare Other | Admitting: Internal Medicine

## 2020-03-03 ENCOUNTER — Other Ambulatory Visit: Payer: Self-pay | Admitting: Internal Medicine

## 2020-03-03 ENCOUNTER — Other Ambulatory Visit: Payer: Self-pay

## 2020-03-03 ENCOUNTER — Encounter: Payer: Self-pay | Admitting: Internal Medicine

## 2020-03-03 VITALS — BP 150/80 | HR 88 | Temp 98.1°F | Ht 70.0 in | Wt 169.0 lb

## 2020-03-03 DIAGNOSIS — I119 Hypertensive heart disease without heart failure: Secondary | ICD-10-CM

## 2020-03-03 DIAGNOSIS — I10 Essential (primary) hypertension: Secondary | ICD-10-CM

## 2020-03-03 DIAGNOSIS — R1011 Right upper quadrant pain: Secondary | ICD-10-CM | POA: Diagnosis not present

## 2020-03-03 DIAGNOSIS — R1110 Vomiting, unspecified: Secondary | ICD-10-CM | POA: Diagnosis not present

## 2020-03-03 DIAGNOSIS — Z8546 Personal history of malignant neoplasm of prostate: Secondary | ICD-10-CM | POA: Diagnosis not present

## 2020-03-03 MED ORDER — LORAZEPAM 0.5 MG PO TABS
ORAL_TABLET | ORAL | 0 refills | Status: DC
Start: 2020-03-03 — End: 2020-07-27

## 2020-03-03 NOTE — Patient Instructions (Addendum)
Patient is to have ultrasound of the gallbladder.  Labs drawn and pending.

## 2020-03-03 NOTE — Progress Notes (Signed)
   Subjective:    Patient ID: Robert Bright, male    DOB: Oct 16, 1930, 86 y.o.   MRN: [id number]  HPI 86 year old Male residing alone for the past few weeks his wife who has dementia was in  [facility] after hospitalization resulting from a fall at home.  He says she returned home yesterday.  His daughter lives here in town is supportive.  Patient called September 10 complaining of vomiting that started 10 PM the previous evening and continued until 1 AM the morning of September 10.  He had eaten biscuits and gravy on September 9 at [facility] and drank some Boost as well as green beans and some other vegetables that he had at home.  Denied hematemesis.  He said on September 10 he has had some right-sided abdominal pain.  I was concerned he might have cholecystitis.  He felt well on the morning of September 10.  He has had no further episodes.  He is here today for evaluation and labs.   Remote history of prostate cancer in 1993 and underwent radical retropubic prostatectomy by Dr. Laurence Ferrari.  History of cancer of the bladder wall is followed by [name].  Hypertension followed by Dr. Acie Fredrickson.  History of GE reflux treated with PPI in 2014 but at that time he was drinking a lot of coffee.  Current medications include HCTZ, Lipitor, Diovan, amlodipine and potassium supplement.  History of anxiety treated with as needed Ativan which was filled today.  Longstanding history of hypertension and hyperlipidemia.    Review of Systems denies nausea vomiting or diarrhea.     Objective:   Physical Exam Blood pressure 150/80 pulse 88 regular temperature 98.1 degrees pulse oximetry 97% weight 169 pounds height 5 feet 10 inches. Neck is supple.  No JVD thyromegaly or carotid bruits.  Chest clear to auscultation.  Abdomen soft nondistended without any significant hepatosplenomegaly masses or tenderness.      Assessment & Plan:  Multiepisodes of emesis on the evening of September  9 have not recurred but right upper quadrant pain at the time was concerning for an attack of cholecystitis.  He will have ultrasound of the gallbladder.  Labs drawn today and pending.

## 2020-03-04 LAB — CBC WITH DIFFERENTIAL/PLATELET
Absolute Monocytes: 1190 cells/uL — ABNORMAL HIGH (ref 200–950)
Basophils Absolute: 48 cells/uL (ref 0–200)
Basophils Relative: 0.5 %
Eosinophils Absolute: 326 cells/uL (ref 15–500)
Eosinophils Relative: 3.4 %
HCT: 42.4 % (ref 38.5–50.0)
Hemoglobin: 14.1 g/dL (ref 13.2–17.1)
Lymphs Abs: 2179 cells/uL (ref 850–3900)
MCH: 31.3 pg (ref 27.0–33.0)
MCHC: 33.3 g/dL (ref 32.0–36.0)
MCV: 94 fL (ref 80.0–100.0)
MPV: 11.7 fL (ref 7.5–12.5)
Monocytes Relative: 12.4 %
Neutro Abs: 5856 cells/uL (ref 1500–7800)
Neutrophils Relative %: 61 %
Platelets: 198 10*3/uL (ref 140–400)
RBC: 4.51 10*6/uL (ref 4.20–5.80)
RDW: 12.3 % (ref 11.0–15.0)
Total Lymphocyte: 22.7 %
WBC: 9.6 10*3/uL (ref 3.8–10.8)

## 2020-03-04 LAB — COMPLETE METABOLIC PANEL WITH GFR
AG Ratio: 1.6 (calc) (ref 1.0–2.5)
ALT: 18 U/L (ref 9–46)
AST: 19 U/L (ref 10–35)
Albumin: 4.2 g/dL (ref 3.6–5.1)
Alkaline phosphatase (APISO): 52 U/L (ref 35–144)
BUN/Creatinine Ratio: 16 (calc) (ref 6–22)
BUN: 31 mg/dL — ABNORMAL HIGH (ref 7–25)
CO2: 26 mmol/L (ref 20–32)
Calcium: 9.7 mg/dL (ref 8.6–10.3)
Chloride: 104 mmol/L (ref 98–110)
Creat: 1.97 mg/dL — ABNORMAL HIGH (ref 0.70–1.11)
GFR, Est African American: 34 mL/min/{1.73_m2} — ABNORMAL LOW (ref >=60)
GFR, Est Non African American: 30 mL/min/{1.73_m2} — ABNORMAL LOW (ref >=60)
Globulin: 2.7 g/dL (calc) (ref 1.9–3.7)
Glucose, Bld: 91 mg/dL (ref 65–99)
Potassium: 4.6 mmol/L (ref 3.5–5.3)
Sodium: 140 mmol/L (ref 135–146)
Total Bilirubin: 0.5 mg/dL (ref 0.2–1.2)
Total Protein: 6.9 g/dL (ref 6.1–8.1)

## 2020-03-04 LAB — LIPASE: Lipase: 75 U/L — ABNORMAL HIGH (ref 7–60)

## 2020-03-09 ENCOUNTER — Other Ambulatory Visit: Payer: Medicare Other

## 2020-03-11 ENCOUNTER — Other Ambulatory Visit: Payer: Medicare Other | Admitting: Internal Medicine

## 2020-03-15 ENCOUNTER — Ambulatory Visit
Admission: RE | Admit: 2020-03-15 | Discharge: 2020-03-15 | Disposition: A | Discharge status: Home / Self Care | Payer: Medicare Other | Source: Ambulatory Visit | Attending: Internal Medicine | Admitting: Internal Medicine

## 2020-03-15 ENCOUNTER — Encounter: Payer: Medicare Other | Admitting: Internal Medicine

## 2020-03-16 ENCOUNTER — Other Ambulatory Visit: Payer: Self-pay

## 2020-03-16 ENCOUNTER — Ambulatory Visit (INDEPENDENT_AMBULATORY_CARE_PROVIDER_SITE_OTHER): Payer: Medicare Other | Admitting: Internal Medicine

## 2020-03-16 DIAGNOSIS — Z23 Encounter for immunization: Secondary | ICD-10-CM | POA: Diagnosis not present

## 2020-03-16 NOTE — Patient Instructions (Signed)
Patient received flu vaccine per CMA. Tolerated procedure well.

## 2020-03-16 NOTE — Progress Notes (Signed)
Flu vaccine per CMA 

## 2020-03-30 ENCOUNTER — Other Ambulatory Visit: Payer: Medicare Other | Admitting: Internal Medicine

## 2020-03-30 ENCOUNTER — Other Ambulatory Visit: Payer: Self-pay

## 2020-03-30 DIAGNOSIS — Z0000 Encounter for general adult medical examination without abnormal findings: Secondary | ICD-10-CM

## 2020-03-30 DIAGNOSIS — Z125 Encounter for screening for malignant neoplasm of prostate: Secondary | ICD-10-CM

## 2020-03-30 DIAGNOSIS — I10 Essential (primary) hypertension: Secondary | ICD-10-CM

## 2020-03-30 DIAGNOSIS — N183 Chronic kidney disease, stage 3 unspecified: Secondary | ICD-10-CM

## 2020-03-30 DIAGNOSIS — I1311 Hypertensive heart and chronic kidney disease without heart failure, with stage 5 chronic kidney disease, or end stage renal disease: Secondary | ICD-10-CM

## 2020-03-31 LAB — CBC WITH DIFFERENTIAL/PLATELET
Absolute Monocytes: 842 cells/uL (ref 200–950)
Basophils Absolute: 31 cells/uL (ref 0–200)
Basophils Relative: 0.4 %
Eosinophils Absolute: 281 cells/uL (ref 15–500)
Eosinophils Relative: 3.6 %
HCT: 42 % (ref 38.5–50.0)
Hemoglobin: 14.2 g/dL (ref 13.2–17.1)
Lymphs Abs: 2129 cells/uL (ref 850–3900)
MCH: 30.7 pg (ref 27.0–33.0)
MCHC: 33.8 g/dL (ref 32.0–36.0)
MCV: 90.9 fL (ref 80.0–100.0)
MPV: 11.4 fL (ref 7.5–12.5)
Monocytes Relative: 10.8 %
Neutro Abs: 4516 cells/uL (ref 1500–7800)
Neutrophils Relative %: 57.9 %
Platelets: 206 10*3/uL (ref 140–400)
RBC: 4.62 10*6/uL (ref 4.20–5.80)
RDW: 12.5 % (ref 11.0–15.0)
Total Lymphocyte: 27.3 %
WBC: 7.8 10*3/uL (ref 3.8–10.8)

## 2020-03-31 LAB — COMPLETE METABOLIC PANEL WITH GFR
AG Ratio: 1.5 (calc) (ref 1.0–2.5)
ALT: 22 U/L (ref 9–46)
AST: 22 U/L (ref 10–35)
Albumin: 4.3 g/dL (ref 3.6–5.1)
Alkaline phosphatase (APISO): 52 U/L (ref 35–144)
BUN/Creatinine Ratio: 17 (calc) (ref 6–22)
BUN: 29 mg/dL — ABNORMAL HIGH (ref 7–25)
CO2: 25 mmol/L (ref 20–32)
Calcium: 9.7 mg/dL (ref 8.6–10.3)
Chloride: 103 mmol/L (ref 98–110)
Creat: 1.75 mg/dL — ABNORMAL HIGH (ref 0.70–1.11)
GFR, Est African American: 40 mL/min/{1.73_m2} — ABNORMAL LOW (ref >=60)
GFR, Est Non African American: 34 mL/min/{1.73_m2} — ABNORMAL LOW (ref >=60)
Globulin: 2.9 g/dL (calc) (ref 1.9–3.7)
Glucose, Bld: 84 mg/dL (ref 65–99)
Potassium: 4.4 mmol/L (ref 3.5–5.3)
Sodium: 140 mmol/L (ref 135–146)
Total Bilirubin: 0.8 mg/dL (ref 0.2–1.2)
Total Protein: 7.2 g/dL (ref 6.1–8.1)

## 2020-03-31 LAB — PSA: PSA: <0.04 ng/mL (ref <=4.0)

## 2020-03-31 LAB — LIPID PANEL
Cholesterol: 146 mg/dL (ref <200)
HDL: 65 mg/dL (ref >=40)
LDL Cholesterol (Calc): 62 mg/dL (calc)
Non-HDL Cholesterol (Calc): 81 mg/dL (calc) (ref <130)
Total CHOL/HDL Ratio: 2.2 (calc) (ref <5.0)
Triglycerides: 102 mg/dL (ref <150)

## 2020-04-02 ENCOUNTER — Encounter: Payer: Self-pay | Admitting: Internal Medicine

## 2020-04-02 ENCOUNTER — Ambulatory Visit (INDEPENDENT_AMBULATORY_CARE_PROVIDER_SITE_OTHER): Payer: Medicare Other | Admitting: Internal Medicine

## 2020-04-02 ENCOUNTER — Other Ambulatory Visit: Payer: Self-pay

## 2020-04-02 VITALS — BP 120/80 | HR 98 | Ht 68.0 in | Wt 168.0 lb

## 2020-04-02 DIAGNOSIS — F419 Anxiety disorder, unspecified: Secondary | ICD-10-CM

## 2020-04-02 DIAGNOSIS — Z8551 Personal history of malignant neoplasm of bladder: Secondary | ICD-10-CM

## 2020-04-02 DIAGNOSIS — Z8546 Personal history of malignant neoplasm of prostate: Secondary | ICD-10-CM

## 2020-04-02 DIAGNOSIS — F439 Reaction to severe stress, unspecified: Secondary | ICD-10-CM

## 2020-04-02 DIAGNOSIS — I119 Hypertensive heart disease without heart failure: Secondary | ICD-10-CM

## 2020-04-02 DIAGNOSIS — Z0000 Encounter for general adult medical examination without abnormal findings: Secondary | ICD-10-CM

## 2020-04-02 DIAGNOSIS — I10 Essential (primary) hypertension: Secondary | ICD-10-CM

## 2020-04-02 DIAGNOSIS — N1831 Chronic kidney disease, stage 3a: Secondary | ICD-10-CM

## 2020-04-02 LAB — POCT URINALYSIS DIPSTICK
Appearance: NEGATIVE
Bilirubin, UA: NEGATIVE
Blood, UA: NEGATIVE
Glucose, UA: NEGATIVE
Ketones, UA: NEGATIVE
Leukocytes, UA: NEGATIVE
Nitrite, UA: NEGATIVE
Odor: NEGATIVE
Protein, UA: POSITIVE — AB
Spec Grav, UA: 1.015 (ref 1.010–1.025)
Urobilinogen, UA: 0.2 E.U./dL
pH, UA: 6.5 (ref 5.0–8.0)

## 2020-04-17 ENCOUNTER — Ambulatory Visit: Payer: Medicare Other

## 2020-04-18 NOTE — Patient Instructions (Addendum)
It was a pleasure to see you today.  Continue current medications and follow-up in 1 year.  Continue close follow-up with Urologist and Cardiologist.

## 2020-04-18 NOTE — Progress Notes (Addendum)
Subjective:    Patient ID: Robert Bright, male    DOB: 01-Mar-1931, 86 y.o.   MRN: [id number]  HPI 86 year old male for health maintenance exam, Medicare wellness and evaluation of medical issues.  His wife was in [facility] living facility for a short period of time but now is back home after an admission to the hospital due to syncope at home.  He has help from home instead but is considering moving to [place].  His wife has dementia.  He was found to have prostate cancer in 1993 treated by radical retropubic prostatectomy by Dr. Laurence Ferrari.  His PSA has subsequently been undetectable.  He is now followed by [name].  He has been diagnosed with cancer of the bladder wall.  He sees Dr. Sherilyn Banker for cystoscopy surveillance.  History of bladder neck contracture and urethral stricture.  He underwent balloon dilatation of his bladder neck in April 2009 which made office cystoscopy possible.  He required repeat dilatation February 2010.  He was noted to have a small membranous urethral stricture March 2013 requiring dilatation and intermittently since then.  He had stress incontinence after dilatation of the bladder neck in 2009 which has improved.  History of right bundle branch block.  Longstanding history of hyperlipidemia and hypertension.  He is maintained on statin therapy.  Herpes zoster affecting left face, orbit and scalp in 2014.  Dr. Carlean Purl saw him in 2014 regarding GE reflux and he was treated with lansoprazole.  At that time he was drinking 6 cups of coffee daily.  Cavus deformity of left foot and is seeing Dr. Paulla Dolly, podiatrist.  He is also followed by Dr. Acie Fredrickson, cardiologist.  History of anxiety treated with as needed Ativan.  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  He was referred to [facility] for elevated serum creatinine and diagnosed with stage III chronic kidney disease.  He was on ARB for hypertension.  Creatinine improved  after ARB was discontinued.  Social history: He is married.  Wife has dementia.  Wife used to be a Pharmacist, hospital.  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  His daughter Truman Hayward is a primary contact for him and her phone number is [phone].  He has 1 son and 2 daughters.  Family history: Father died at age 79 with complications of rheumatoid arthritis.  Mother died at age 75 from complications of surgery.  2 sisters ages 43 and 68 in good health.  Mother had dementia.    Review of Systems see above-recently had an episode of nausea and vomiting and gallbladder ultrasound was negative for cholelithiasis     Objective:   Physical Exam  Blood pressure 120/80 pulse 98 regular pulse oximetry 96% weight 168 pounds BMI 25.54.  He looks a bit frail.  Skin warm and dry.  Nodes none.  TMs are clear.  Neck is supple without JVD thyromegaly or carotid bruits.  Chest is clear to auscultation.  Cardiac exam: Regular rate and rhythm normal S1 and S2 without ectopy.  Abdomen soft nondistended without hepatosplenomegaly masses or tenderness.  Rectal exam is deferred to urologist.  No lower extremity pitting edema.  Neuro is intact without focal deficits.  Affect thought and judgment appear to be normal.      Assessment & Plan:  Remote history of prostate cancer status post prostatectomy and followed by [name] history of bladder cancer followed by [name].  Essential hypertension-stable on current regimen  Bilateral renal cysts  Anxiety  Stage III chronic kidney disease-creatinine stable at 1.75  Plan: He will continue with current medications and continue follow-up with urology as well as cardiology.  Return in 1 year or as needed.  Subjective:   Patient presents for Medicare Annual/Subsequent preventive examination.  Review Past Medical/Family/Social: See above   Risk Factors  Current exercise habits: Light exercise Dietary issues discussed: Low-fat low  carbohydrate recommended  Cardiac risk factors:-None   Depression Screen  (Note: if answer to either of the following is "Yes", a more complete depression screening is indicated)   Over the past two weeks, have you felt down, depressed or hopeless? No  Over the past two weeks, have you felt little interest or pleasure in doing things? No Have you lost interest or pleasure in daily life? No Do you often feel hopeless? No Do you cry easily over simple problems? No   Activities of Daily Living  In your present state of health, do you have any difficulty performing the following activities?:   Driving? No  Managing money? No  Feeding yourself? No  Getting from bed to chair? No  Climbing a flight of stairs? No  Preparing food and eating?: No  Bathing or showering? No  Getting dressed: No  Getting to the toilet? No  Using the toilet:No  Moving around from place to place: No  In the past year have you fallen or had a near fall?:No  Are you sexually active? No  Do you have more than one partner? No   Hearing Difficulties: No  Do you often ask people to speak up or repeat themselves? No  Do you experience ringing or noises in your ears? No  Do you have difficulty understanding soft or whispered voices? No  Do you feel that you have a problem with memory? No Do you often misplace items? No    Home Safety:  Do you have a smoke alarm at your residence? Yes Do you have grab bars in the bathroom?  No Do you have throw rugs in your house?  Yes   Cognitive Testing  Alert? Yes Normal Appearance?Yes  Oriented to person? Yes Place? Yes  Time? Yes  Recall of three objects? Yes  Can perform simple calculations? Yes  Displays appropriate judgment?Yes  Can read the correct time from a watch face?Yes   List the Names of Other Physician/Practitioners you currently use:  See referral list for the physicians patient is currently seeing.  Dr. Acie Fredrickson  [name]   Review of Systems:  See above   Objective:     General appearance: Appears stated age and slightly frail Head: Normocephalic, without obvious abnormality, atraumatic  Eyes: conj clear, EOMi PEERLA  Ears: normal TM's and external ear canals both ears  Nose: Nares normal. Septum midline. Mucosa normal. No drainage or sinus tenderness.  Throat: lips, mucosa, and tongue normal; teeth and gums normal  Neck: no adenopathy, no carotid bruit, no JVD, supple, symmetrical, trachea midline and thyroid not enlarged, symmetric, no tenderness/mass/nodules  No CVA tenderness.  Lungs: clear to auscultation bilaterally  Breasts: normal appearance, no masses or tenderness, Heart: regular rate and rhythm, S1, S2 normal, no murmur, click, rub or gallop  Abdomen: soft, non-tender; bowel sounds normal; no masses, no organomegaly  Musculoskeletal: ROM normal in all joints, no crepitus, no deformity, Normal muscle strengthen. Back  is symmetric, no curvature. Skin: Skin color, texture, turgor normal. No rashes or lesions  Lymph nodes: Cervical, supraclavicular, and  axillary nodes normal.  Neurologic: CN 2 -12 Normal, Normal symmetric reflexes. Normal coordination and gait  Psych: Alert & Oriented x 3, Mood appear stable.    Assessment:    Annual wellness medicare exam   Plan:    During the course of the visit the patient was educated and counseled about appropriate screening and preventive services including:  Had flu vaccine in September  Has had 2 Covid vaccines and has had pneumococcal 23 vaccine.  Tetanus immunization is up-to-date.      Patient Instructions (the written plan) was given to the patient.  Medicare Attestation  I have personally reviewed:  The patient's medical and social history  Their use of alcohol, tobacco or illicit drugs  Their current medications and supplements  The patient's functional ability including ADLs,fall risks, home safety risks, cognitive, and hearing and visual impairment    Diet and physical activities  Evidence for depression or mood disorders  The patient's weight, height, BMI, and visual acuity have been recorded in the chart. I have made referrals, counseling, and provided education to the patient based on review of the above and I have provided the patient with a written personalized care plan for preventive services.

## 2020-05-05 ENCOUNTER — Other Ambulatory Visit: Payer: Self-pay | Admitting: Cardiovascular Disease

## 2020-05-16 ENCOUNTER — Encounter: Payer: Self-pay | Admitting: Cardiovascular Disease

## 2020-05-16 ENCOUNTER — Other Ambulatory Visit: Payer: Self-pay | Admitting: Cardiovascular Disease

## 2020-05-16 NOTE — Progress Notes (Signed)
Cardiology Office Note   Date:  05/17/2020   ID:  Robert Bright, DOB Mar 27, 1931, MRN [id number]  PCP:  Elby Showers, MD  Cardiologist:  Previous Darlin Coco MD,  Now Chelcea Zahn   Chief Complaint  Patient presents with  . Hypertension      Notes from Dr. Liborio Nixon is a 86 y.o. male who presents for scheduled 6 month follow-up visit.  He is a retired IT trainer.[name] He has a history of essential hypertension and a history of hypercholesterolemia. He does not have any history of chest pain or ischemic heart disease. He had a normal nuclear stress test in 2004. He does have a past history of prostate cancer and a more recent history of bladder cancer. [name] is his urologist. Since last visit he has been feeling well .  When we last saw him [place]  However he states that the events to many doctors visits scheduled that they never got to the beach.  He is disappointed in that. From the cardiac standpoint he has not been experiencing chest pain or shortness of breath.  We reviewed his lab work which is satisfactory.  January 04, 2016:  Seen for the first time today .  Transfer from 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 31, 2018:   Robert Bright is seen back today for his HTN and hyperlipidemia .    Had a skin cancer removed from his nose .  Also has had a bladder cancer removed.   April 17, 2018:  Robert Bright is seen today ( with daughter, [name])   for follow-up of his hypertension and hyperlipidemia. He was seen by Vin  in May, 2019. Has been seen by Dr. Johnney Ou ( nephrology )  She stopped the Diovan several weeks ago . BP has been a bit elevated ( by his BP cuff)  Eats  at [employer] almost every day  Still eats a very salty diet.  Eats [employer] sandwiches 3-4 times a week.  He has chicken noodle soup almost every evening.  Nov. 24, 2020 Seen with daughter [name] is seen today for follow-up of his hypertension and hyperlipidemia.  He also has chronic kidney disease.  I saw him several years ago.    Duaghter is [name] .   Is a [employer] graduate .   We discuss the [employer]  / [employer] rivalry .    Nov. 29, 2021: Robert Bright is seen today for follow up of his HTN and HLD ,  Seen with daughter  , [name] and wife.  Is a [employer] grad.   [employer] beat [employer] 45-0 this past weekend.  Walks occasionally     Past Medical History:  Diagnosis Date  . Anxiety   . Bladder cancer [facility]) 1993   Being followed by [name]  . ED (erectile dysfunction)   . GERD (gastroesophageal reflux disease)   . Heart murmur   . Hypercholesterolemia   . Hyperlipidemia   . Hypertension    with a component of whitecoat syndrome  . Prostate cancer ([place]) 1993   Hx ostatectomyof remote treated with  radial pr  . Shingles   . Stress incontinence   . Ureteral stricture   . Vertigo     Past Surgical History:  Procedure Laterality Date  . BLADDER TUMOR EXCISION    . CARDIOVASCULAR STRESS TEST  01-02-2003   EF 68%  . COLONOSCOPY    . CYSTOSCOPY    . CYSTOSCOPY W/ RETROGRADES Bilateral 04/24/2016   Procedure: CYSTOSCOPY WITH BILATERAL RETROGRADE PYELOGRAM;  Surgeon: Raynelle Bring, MD;  Location: [facility];  Service: Urology;  Laterality: Bilateral;  . CYSTOSTOMY W/ BLADDER BIOPSY    . EYE SURGERY    . INGUINAL HERNIA REPAIR    . LYMPHADENECTOMY    . PROSTATECTOMY  1993  . TRANSURETHRAL RESECTION OF BLADDER TUMOR N/A 04/24/2016   Procedure: TRANSURETHRAL RESECTION OF BLADDER TUMOR (TURBT);  Surgeon: Raynelle Bring, MD;  Location: [facility];  Service: Urology;  Laterality: N/A;     Current Outpatient Medications  Medication Sig Dispense Refill  . atorvastatin  (LIPITOR) 20 MG tablet TAKE 1 TABLET(20 MG) BY MOUTH DAILY 90 tablet 3  . hydrochlorothiazide (HYDRODIURIL) 25 MG tablet One by mouth every other day. Please dispense patient is out. 90 tablet 3  . LORazepam (ATIVAN) 0.5 MG tablet TAKE 1 TABLET BY MOUTH ONCE DAILY AS NEEDED FOR ANXIETY 30 tablet 0  . NEOMYCIN-POLYMYXIN-HYDROCORTISONE (CORTISPORIN) 1 % SOLN OTIC solution Place 3 drops into the left ear 4 (four) times daily. 10 mL 1  . NORVASC 5 MG tablet Take 1 tablet (5 mg total) by mouth daily. 90 tablet 3  . potassium chloride SA (KLOR-CON) 20 MEQ tablet TAKE 1 TABLET(20 MEQ) BY MOUTH DAILY 90 tablet 3  . VIAGRA 50 MG tablet Take 1 tablet (50 mg total) by mouth daily as needed for erectile dysfunction. 10 tablet 1   No current facility-administered medications for this visit.    Allergies:   Ramipril    Social History:  The patient  reports that he quit smoking about 34 years ago. He has never used smokeless tobacco. He reports that he does not drink alcohol and does not use drugs.   Family History:  The patient's family history includes Arthritis/Rheumatoid in his father; Dementia in his mother; Healthy in his sister and sister.    ROS:  Please see the history of present illness.   Otherwise, review of systems are positive for none.   All other systems are reviewed and negative.    Physical Exam: Blood pressure 134/74, pulse 90, height 5\' 8"  (1.727 m), weight 169 lb 6.4 oz (76.8 kg), SpO2 98 %.  GEN:  Well nourished, well developed in no acute distress HEENT: Normal NECK: No JVD; No carotid bruits LYMPHATICS: No lymphadenopathy CARDIAC: RRR , no murmurs, rubs, gallops RESPIRATORY:  Clear to auscultation without rales, wheezing or rhonchi  ABDOMEN: Soft, non-tender, non-distended MUSCULOSKELETAL:  No edema; No deformity  SKIN: Warm and dry NEUROLOGIC:  Alert and oriented x 3    EKG:   May 17, 2020: Normal sinus rhythm at 90.  Right bundle branch block.  No changes from  previous EKG.   Recent Labs: 03/30/2020: ALT 22; BUN 29; Creat 1.75; Hemoglobin 14.2; Platelets 206; Potassium 4.4; Sodium 140    Lipid Panel    Component Value Date/Time   CHOL 146 03/30/2020 1008   CHOL 255 (H) 10/17/2017 1101   TRIG 102 03/30/2020 1008   HDL 65 03/30/2020 1008   HDL 96 10/17/2017 1101   CHOLHDL 2.2 03/30/2020 1008   VLDL 25 06/26/2016  [place] 03/30/2020 1008      Wt Readings from Last 3 Encounters:  05/17/20 169 lb 6.4 oz (76.8 kg)  04/02/20 168 lb (76.2 kg)  03/03/20 169 lb (76.7 kg)        ASSESSMENT AND PLAN:  1. essential hypertension: Blood pressure is very well controlled.  Continue current medications.  2. Hyperlipidemia -   labs were ordered by Dr. Renold Genta.  His labs look good.   3. right bundle branch block  -chronic and stable.   4. Sinus bradycardia:      Current medicines are reviewed at length with the patient today.  The patient does not have concerns regarding medicines.  The following changes have been made:  no change  Labs/ tests ordered today include:   Orders Placed This Encounter  Procedures  . EKG 12-Lead   Follow up in a year    Mertie Moores, MD  05/17/2020 3:15 PM    [address],  [place], [place]  [address] Pager [phone] Phone: [phone]; Fax: [phone]

## 2020-05-17 ENCOUNTER — Telehealth: Payer: Self-pay

## 2020-05-17 ENCOUNTER — Encounter: Payer: Self-pay | Admitting: Cardiovascular Disease

## 2020-05-17 ENCOUNTER — Ambulatory Visit: Payer: Medicare Other | Admitting: Cardiovascular Disease

## 2020-05-17 ENCOUNTER — Other Ambulatory Visit: Payer: Self-pay

## 2020-05-17 VITALS — BP 134/74 | HR 90 | Ht 68.0 in | Wt 169.4 lb

## 2020-05-17 DIAGNOSIS — I119 Hypertensive heart disease without heart failure: Secondary | ICD-10-CM

## 2020-05-17 MED ORDER — NORVASC 5 MG PO TABS
5.0000 mg | ORAL_TABLET | Freq: Every day | ORAL | 3 refills | Status: DC
Start: 2020-05-17 — End: 2020-05-17

## 2020-05-17 MED ORDER — AMLODIPINE BESYLATE 5 MG PO TABS
5.0000 mg | ORAL_TABLET | Freq: Every day | ORAL | 3 refills | Status: DC
Start: 2020-05-17 — End: 2021-05-03

## 2020-05-17 NOTE — Patient Instructions (Signed)

## 2020-05-17 NOTE — Telephone Encounter (Signed)
Clarified with Dr. Acie Fredrickson that it was ok for pt to receive generic and he said that was fine.  Spoke with pt and made him aware that I have sent a new prescription to the pharmacy and took the dispense as written off so he can get generic.  Pt appreciative for call.

## 2020-05-17 NOTE — Telephone Encounter (Signed)
Pt calling stating that he was here today to see Dr. Acie Fredrickson and he told the pt that he could get amlodipine instead of the Norvasc. Pt would a call back concerning this matter at [phone]. Please address

## 2020-07-27 ENCOUNTER — Other Ambulatory Visit: Payer: Self-pay | Admitting: Internal Medicine

## 2020-07-29 ENCOUNTER — Other Ambulatory Visit: Payer: Self-pay | Admitting: Cardiovascular Disease

## 2020-09-01 ENCOUNTER — Other Ambulatory Visit: Payer: Self-pay | Admitting: Cardiovascular Disease

## 2020-10-07 ENCOUNTER — Other Ambulatory Visit: Payer: Self-pay | Admitting: Internal Medicine

## 2021-02-22 ENCOUNTER — Other Ambulatory Visit: Payer: Self-pay | Admitting: Internal Medicine

## 2021-03-08 ENCOUNTER — Telehealth: Payer: Self-pay | Admitting: Internal Medicine

## 2021-03-08 NOTE — Telephone Encounter (Signed)
LVM to CB to schedule an appointment 

## 2021-03-08 NOTE — Telephone Encounter (Signed)
Robert Bright [phone]  [name] called to say about 4 days ago his R ankle and toes started swelling and now it is moving into his L toes and ankle. Would like to come in for you to look at it.

## 2021-03-08 NOTE — Telephone Encounter (Signed)
scheduled

## 2021-03-10 ENCOUNTER — Other Ambulatory Visit: Payer: Self-pay

## 2021-03-10 ENCOUNTER — Encounter: Payer: Self-pay | Admitting: Internal Medicine

## 2021-03-10 ENCOUNTER — Ambulatory Visit: Payer: Medicare Other | Admitting: Internal Medicine

## 2021-03-10 VITALS — BP 130/74 | HR 75 | Resp 18 | Ht 70.0 in | Wt 173.0 lb

## 2021-03-10 DIAGNOSIS — N1831 Chronic kidney disease, stage 3a: Secondary | ICD-10-CM | POA: Diagnosis not present

## 2021-03-10 DIAGNOSIS — R600 Localized edema: Secondary | ICD-10-CM

## 2021-03-10 DIAGNOSIS — I10 Essential (primary) hypertension: Secondary | ICD-10-CM

## 2021-03-10 DIAGNOSIS — I4510 Unspecified right bundle-branch block: Secondary | ICD-10-CM

## 2021-03-10 MED ORDER — HYDROCHLOROTHIAZIDE 25 MG PO TABS: 25.0000 mg | ORAL_TABLET | Freq: Every day | ORAL | 3 refills | Status: DC

## 2021-03-10 NOTE — Progress Notes (Signed)
   Subjective:    Patient ID: Robert Bright, male    DOB: 03-16-1931, 87 y.o.   MRN: [id number]  HPI 87 year old Male with lower extremity edema present for about 5 days. No chest pain No SOB.  He does walk some for exercise.  He has longstanding history of hypertension.  He has history of right bundle branch block and takes statin medication.  He is on amlodipine 5 mg daily in addition to atorvastatin 20 mg daily, potassium supplement, lorazepam for anxiety.  Amlodipine can cause some fluid retention and resultant edema.  He has a remote history of prostate cancer without recurrence.  Review of Systems he denies chest pain or shortness of breath     Objective:   Physical Exam Blood pressure 130/74 pulse 75 respiratory rate 18 pulse oximetry 96% weight 173 pounds BMI 24.82  He is alert.  His respiratory rate is slightly elevated but he does not appear to be tachypneic seen in chair today in the exam room resting.  History stage IIIa chronic kidney disease  Remote history of prostate cancer  Skin is warm and dry.  No cervical adenopathy.  No JVD.  No carotid bruits.  Chest is clear to auscultation.  Cardiac exam: Regular rate and rhythm without ectopy, murmurs or gallops.  He does have 1+ pitting edema of the lower extremities.       Assessment & Plan:  The weather has been extremely hot and he has been out walking some.  He is on amlodipine and that could contribute to lower extremity edema.  I do not find any evidence of congestive heart failure at the present time.  Plan: We are going to try HCTZ 25 mg daily.  He will continue amlodipine.  His annual exam has been booked already for mid October and we can see him at that time.  If edema is not improving we can see him sooner.  Creatinine in October 2021 was 1.75.  He will have labs drawn before his next visit including electrolytes and kidney functions.

## 2021-03-10 NOTE — Patient Instructions (Signed)
You have been diagnosed with lower extremity edema.  We will try HCTZ 25 mg daily.  Continue amlodipine.  Follow-up in mid October or sooner if not improving.  It was a pleasure to see you today.

## 2021-03-14 ENCOUNTER — Other Ambulatory Visit: Payer: Self-pay

## 2021-03-14 ENCOUNTER — Ambulatory Visit: Payer: Medicare Other | Attending: Internal Medicine

## 2021-03-14 DIAGNOSIS — Z23 Encounter for immunization: Secondary | ICD-10-CM

## 2021-03-15 ENCOUNTER — Other Ambulatory Visit (HOSPITAL_BASED_OUTPATIENT_CLINIC_OR_DEPARTMENT_OTHER): Payer: Self-pay

## 2021-03-15 MED ORDER — PFIZER COVID-19 VAC BIVALENT 30 MCG/0.3ML IM SUSP
INTRAMUSCULAR | 0 refills | Status: DC
  Filled 2021-03-15: qty 0.3, 1d supply, fill #0

## 2021-03-15 NOTE — Progress Notes (Signed)
   Covid-19 Vaccination Clinic  Name:  Robert Bright    MRN: [id number] DOB: 1931/01/15  03/15/2021  Robert Bright was observed post Covid-19 immunization for 15 minutes without incident. He was provided with Vaccine Information Sheet and instruction to access the V-Safe system.   Robert Bright was instructed to call 911 with any severe reactions post vaccine: Difficulty breathing  Swelling of face and throat  A fast heartbeat  A bad rash all over body  Dizziness and weakness

## 2021-04-01 ENCOUNTER — Other Ambulatory Visit: Payer: Medicare Other | Admitting: Internal Medicine

## 2021-04-01 ENCOUNTER — Other Ambulatory Visit: Payer: Self-pay

## 2021-04-01 DIAGNOSIS — Z0000 Encounter for general adult medical examination without abnormal findings: Secondary | ICD-10-CM

## 2021-04-01 DIAGNOSIS — I10 Essential (primary) hypertension: Secondary | ICD-10-CM

## 2021-04-01 DIAGNOSIS — N1831 Chronic kidney disease, stage 3a: Secondary | ICD-10-CM

## 2021-04-01 DIAGNOSIS — Z125 Encounter for screening for malignant neoplasm of prostate: Secondary | ICD-10-CM

## 2021-04-01 DIAGNOSIS — Z8546 Personal history of malignant neoplasm of prostate: Secondary | ICD-10-CM

## 2021-04-01 DIAGNOSIS — I119 Hypertensive heart disease without heart failure: Secondary | ICD-10-CM

## 2021-04-05 ENCOUNTER — Ambulatory Visit (INDEPENDENT_AMBULATORY_CARE_PROVIDER_SITE_OTHER): Payer: Medicare Other | Admitting: Internal Medicine

## 2021-04-05 ENCOUNTER — Telehealth: Payer: Self-pay | Admitting: Internal Medicine

## 2021-04-05 ENCOUNTER — Other Ambulatory Visit: Payer: Self-pay

## 2021-04-05 VITALS — BP 136/74 | HR 52 | Temp 97.3°F | Resp 16 | Ht 70.0 in | Wt 176.0 lb

## 2021-04-05 DIAGNOSIS — Z8551 Personal history of malignant neoplasm of bladder: Secondary | ICD-10-CM | POA: Diagnosis not present

## 2021-04-05 DIAGNOSIS — Z0000 Encounter for general adult medical examination without abnormal findings: Secondary | ICD-10-CM | POA: Diagnosis not present

## 2021-04-05 DIAGNOSIS — N1831 Chronic kidney disease, stage 3a: Secondary | ICD-10-CM

## 2021-04-05 DIAGNOSIS — I119 Hypertensive heart disease without heart failure: Secondary | ICD-10-CM

## 2021-04-05 DIAGNOSIS — Z8546 Personal history of malignant neoplasm of prostate: Secondary | ICD-10-CM | POA: Diagnosis not present

## 2021-04-05 DIAGNOSIS — F419 Anxiety disorder, unspecified: Secondary | ICD-10-CM

## 2021-04-05 DIAGNOSIS — R600 Localized edema: Secondary | ICD-10-CM

## 2021-04-05 DIAGNOSIS — I4510 Unspecified right bundle-branch block: Secondary | ICD-10-CM

## 2021-04-05 DIAGNOSIS — I10 Essential (primary) hypertension: Secondary | ICD-10-CM

## 2021-04-05 LAB — CBC WITH DIFFERENTIAL/PLATELET
Absolute Monocytes: 945 cells/uL (ref 200–950)
Basophils Absolute: 30 cells/uL (ref 0–200)
Basophils Relative: 0.4 %
Eosinophils Absolute: 360 cells/uL (ref 15–500)
Eosinophils Relative: 4.8 %
HCT: 40.8 % (ref 38.5–50.0)
Hemoglobin: 13.7 g/dL (ref 13.2–17.1)
Lymphs Abs: 2018 cells/uL (ref 850–3900)
MCH: 30.7 pg (ref 27.0–33.0)
MCHC: 33.6 g/dL (ref 32.0–36.0)
MCV: 91.5 fL (ref 80.0–100.0)
MPV: 11 fL (ref 7.5–12.5)
Monocytes Relative: 12.6 %
Neutro Abs: 4148 cells/uL (ref 1500–7800)
Neutrophils Relative %: 55.3 %
Platelets: 215 10*3/uL (ref 140–400)
RBC: 4.46 10*6/uL (ref 4.20–5.80)
RDW: 12.6 % (ref 11.0–15.0)
Total Lymphocyte: 26.9 %
WBC: 7.5 10*3/uL (ref 3.8–10.8)

## 2021-04-05 LAB — COMPLETE METABOLIC PANEL WITH GFR
AG Ratio: 1.5 (calc) (ref 1.0–2.5)
ALT: 14 U/L (ref 9–46)
AST: 17 U/L (ref 10–35)
Albumin: 4 g/dL (ref 3.6–5.1)
Alkaline phosphatase (APISO): 57 U/L (ref 35–144)
BUN/Creatinine Ratio: 19 (calc) (ref 6–22)
BUN: 37 mg/dL — ABNORMAL HIGH (ref 7–25)
CO2: 27 mmol/L (ref 20–32)
Calcium: 9.8 mg/dL (ref 8.6–10.3)
Chloride: 103 mmol/L (ref 98–110)
Creat: 1.93 mg/dL — ABNORMAL HIGH (ref 0.70–1.22)
Globulin: 2.7 g/dL (calc) (ref 1.9–3.7)
Glucose, Bld: 87 mg/dL (ref 65–99)
Potassium: 4 mmol/L (ref 3.5–5.3)
Sodium: 140 mmol/L (ref 135–146)
Total Bilirubin: 0.5 mg/dL (ref 0.2–1.2)
Total Protein: 6.7 g/dL (ref 6.1–8.1)
eGFR: 33 mL/min/{1.73_m2} — ABNORMAL LOW (ref >=60)

## 2021-04-05 LAB — LIPID PANEL
Cholesterol: 137 mg/dL (ref <200)
HDL: 54 mg/dL (ref >=40)
LDL Cholesterol (Calc): 65 mg/dL (calc)
Non-HDL Cholesterol (Calc): 83 mg/dL (calc) (ref <130)
Total CHOL/HDL Ratio: 2.5 (calc) (ref <5.0)
Triglycerides: 97 mg/dL (ref <150)

## 2021-04-05 LAB — TIQ-MISC

## 2021-04-05 LAB — BRAIN NATRIURETIC PEPTIDE: Brain Natriuretic Peptide: 341 pg/mL — ABNORMAL HIGH (ref <100)

## 2021-04-05 LAB — PSA: PSA: <0.04 ng/mL (ref <=4.00)

## 2021-04-05 MED ORDER — DOXYCYCLINE HYCLATE 100 MG PO TABS: 100.0000 mg | ORAL_TABLET | Freq: Two times a day (BID) | ORAL | 0 refills | Status: DC

## 2021-04-05 NOTE — Progress Notes (Signed)
    Subjective:    Patient ID: Robert Bright, male    DOB: August 29, 1930, 87 y.o.   MRN: [id number]  HPI

## 2021-04-05 NOTE — Telephone Encounter (Signed)
Faxed labs to [facility] [phone], phone [phone]

## 2021-04-11 NOTE — Addendum Note (Signed)
Addended by: Elby Showers on: 04/11/2021 10:50 AM   Modules accepted: Orders

## 2021-04-13 ENCOUNTER — Encounter: Payer: Self-pay | Admitting: Internal Medicine

## 2021-04-13 NOTE — Progress Notes (Signed)
Annual Wellness Visit     Patient: Robert Bright, Male    DOB: 22-Jan-1931, 87 y.o.   MRN: [id number] Visit Date: 04/05/2021  Chief Complaint  Patient presents with   Medicare Wellness    Pt here for medicare exam, note no chest pain or arm pains. No concerns    Annual Exam   Subjective    Robert Bright is a 87 y.o. male who presents today for his Annual Wellness Visit.  HPI  87 year old Male for health maintenance exam,Medicare wellness, and evaluation of medical issues. Wife died of complications of dementia. Eats out a lot but doing Ok and no plans to move to a retirement facility at this point.  Has chronic kidney disease. Creatinine 1.93 increased slightly from 1.75 last year.  Seen at [facility] in February.  PSA was.  Today it is less than 0.04.  He was found to have prostate cancer in 1993 treated by radical retropubic prostatectomy by Dr. Laurence Ferrari.  His PSA has subsequently been undetectable.  He is now followed by [name].  He has been diagnosed with cancer of the bladder wall.  He sees urologist for cystoscopy surveillance.  History of bladder neck contracture and urethral stricture.  He underwent balloon dilatation of his bladder neck in April 2009 which made office visit if possible.  He has required repeat dilatation in 2018.  He has a small membranous urethral stricture noted March 2013 requiring dilatation and intermittently since then.  He had stress incontinence after dilatation of the bladder neck in 2009 which improved.  History of right bundle branch block.  He is followed by cardiologist.  He is maintained on statin therapy.  Longstanding history of hyperlipidemia and hypertension.  Herpes zoster affecting left face, orbit and scalp in 2014.  Cavus deformity of the left foot-sees Dr. Paulla Dolly, podiatrist  Dr. Carlean Purl saw him in 2014 regarding GE reflux and he was treated with generic PPI.  At that time he was drinking 6 cups of coffee  daily.  History of anxiety treated with as needed Ativan.  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  Followed by [facility] and will be seen soon there.  He has chronic kidney disease.  Traveling to [place] soon and wants Rx for Doxycycline. This was e-scribed as requested.  Social history: He is married.  Wife deceased from complications of dementia.  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  He has 1 son and 2 daughters.  Family history: Father died at 25 with complications of rheumatoid arthritis.  Mother died at age 2 from complications of surgery.  2 sisters, ages 44 and 37 with good health.  Mother had dementia.  Review of systems:   Patient Care Team: Hilbert Briggs, Cresenciano Lick, MD as PCP - General (Internal Medicine) Nahser, Wonda Cheng, MD as PCP - Cardiology (Cardiology)  Review of Systems-denies chest pain or shortness of breath.  He walks daily.  No food intolerances.  He eats out a lot.  Physical exam   Objective    Vitals: BP 136/74   Pulse (!) 52   Temp (!) 97.3 F (36.3 C) (Tympanic)   Resp 16   Ht 5\' 10"  (1.778 m)   Wt 176 lb (79.8 kg)   SpO2 98%   BMI 25.25 kg/m   Physical Exam Vitals reviewed.  Constitutional:      General: He is not in acute distress.    Appearance: Normal  appearance.  HENT:     Head: Normocephalic and atraumatic.     Right Ear: Tympanic membrane normal.     Left Ear: Tympanic membrane normal.     Nose: Nose normal.     Mouth/Throat:     Pharynx: Oropharynx is clear.  Eyes:     General: No scleral icterus.    Extraocular Movements: Extraocular movements intact.     Conjunctiva/sclera: Conjunctivae normal.     Pupils: Pupils are equal, round, and reactive to light.  Neck:     Vascular: No carotid bruit.  Cardiovascular:     Rate and Rhythm: Normal rate and regular rhythm.     Heart sounds: Normal heart sounds. No murmur heard. Pulmonary:     Effort: Pulmonary effort is normal.     Breath  sounds: Normal breath sounds. No rales.  Abdominal:     General: Bowel sounds are normal.     Palpations: Abdomen is soft.     Tenderness: There is no right CVA tenderness or left CVA tenderness.  Genitourinary:    Comments: Deferred to urologist Musculoskeletal:     Cervical back: Neck supple.     Right lower leg: No edema.     Left lower leg: No edema.  Lymphadenopathy:     Cervical: No cervical adenopathy.  Skin:    General: Skin is warm and dry.     Findings: No rash.  Neurological:     General: No focal deficit present.     Mental Status: He is alert and oriented to person, place, and time.     Cranial Nerves: No cranial nerve deficit.     Motor: No weakness.  Psychiatric:        Mood and Affect: Mood normal.        Behavior: Behavior normal.        Thought Content: Thought content normal.        Judgment: Judgment normal.     Most recent functional status assessment: In your present state of health, do you have any difficulty performing the following activities: 03/10/2021  Hearing? N  Vision? N  Difficulty concentrating or making decisions? N  Walking or climbing stairs? N  Dressing or bathing? N  Doing errands, shopping? N  Some recent data might be hidden   Most recent fall risk assessment: Fall Risk  04/05/2021  Falls in the past year? 0  Number falls in past yr: 0  Injury with Fall? 0  Risk for fall due to : History of fall(s)  Follow up Falls evaluation completed    Most recent depression screenings: PHQ 2/9 Scores 04/05/2021 04/02/2020  PHQ - 2 Score 0 0   Most recent cognitive screening: 6CIT Screen 04/05/2021  What Year? 0 points  What month? 0 points  What time? 0 points  Count back from 20 0 points  Months in reverse 0 points  Repeat phrase 0 points  Total Score 0       Assessment & Plan     Annual wellness visit done today including the all of the following: Reviewed patient's Family Medical History Reviewed and updated list of  patient's medical providers Assessment of cognitive impairment was done Assessed patient's functional ability Established a written schedule for health screening [place] Completed and Reviewed  Discussed health benefits of physical activity, and encouraged him to engage in regular exercise appropriate for his age and condition.    Remote history of prostate cancer status post prostatectomy followed  by [name].  History of bladder cancer followed by [name].  Essential hypertension-stable on current regimen.  Followed by cardiology  Bilateral renal cyst  Anxiety  Stage III chronic kidney disease with stable creatinine  Plan: He will continue with current medications and continue follow-up with Urology as well as Cardiology.  Return in 1 year or as needed.     IElby Showers, MD, have reviewed all documentation for this visit. The documentation on 04/13/21 for the exam, diagnosis, procedures, and orders are all accurate and complete.    Angus Seller, CMA

## 2021-04-13 NOTE — Patient Instructions (Signed)
It was a pleasure to see you today. Continue same meds and RTC in one year.

## 2021-04-18 ENCOUNTER — Ambulatory Visit: Payer: Medicare Other

## 2021-04-18 ENCOUNTER — Other Ambulatory Visit (HOSPITAL_BASED_OUTPATIENT_CLINIC_OR_DEPARTMENT_OTHER): Payer: Self-pay

## 2021-05-03 ENCOUNTER — Other Ambulatory Visit: Payer: Self-pay | Admitting: Cardiovascular Disease

## 2021-05-15 ENCOUNTER — Encounter: Payer: Self-pay | Admitting: Cardiovascular Disease

## 2021-05-15 NOTE — Progress Notes (Signed)
Cardiology Office Note   Date:  05/16/2021   ID:  Robert Bright, DOB [birth year], MRN [id number]  PCP:  Elby Showers, MD  Cardiologist:  Previous Darlin Coco MD,  Now Tien Spooner   Chief Complaint  Patient presents with   Hypertension            Notes from Dr. Liborio Nixon is a 87 y.o. male who presents for scheduled 6 month follow-up visit.  He is a retired IT trainer.[name] He has a history of essential hypertension and a history of hypercholesterolemia. He does not have any history of chest pain or ischemic heart disease. He had a normal nuclear stress test in 2004. He does have a past history of prostate cancer and a more recent history of bladder cancer. [name] is his urologist.  Since last visit he has been feeling well .  When we last saw him [place]  However he states that the events to many doctors visits scheduled that they never got to the beach.  He is disappointed in that. From the cardiac standpoint he has not been experiencing chest pain or shortness of breath.  We reviewed his lab work which is satisfactory.  January 04, 2016:  Seen for the first time today .  Transfer from 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 31, 2018:   Robert Bright is seen back today for his HTN and hyperlipidemia .    Had a skin cancer removed from his nose .  Also has had a bladder cancer removed.   April 17, 2018:  Robert Bright is seen today ( with daughter, [name])   for follow-up of his hypertension and hyperlipidemia. He was seen by Vin  in May, 2019. Has been seen by Dr. Johnney Ou ( nephrology )  She stopped the Diovan several weeks ago . BP has been a bit elevated ( by his BP cuff)   Eats at [employer] almost every day  Still eats a very salty diet.  Eats [employer] sandwiches 3-4 times a week.  He has chicken noodle soup almost every evening.  Nov. 24, 2020 Seen with daughter [name] is seen today for follow-up of his hypertension and hyperlipidemia.  He also has chronic kidney disease.  I saw him several years ago.    Duaghter is [name] .   Is a [employer] graduate .   We discuss the [employer]  / [employer] rivalry .    Nov. 29, 2021: Robert Bright is seen today for follow up of his HTN and HLD ,  Seen with daughter  , [name] and wife.  Is a [place] grad.   [place] beat [employer] [employer] 45-0 this past weekend.  Walks occasionally   Nov. 28, 2022 Robert Bright is seen today for folllow up of his HTN and HLD . Is a [place] grad.   [employer] beat [employer]( 37-14)  this past weekend No CP , no dyspnea.  Has some leg swelling  Legs ache on occasion     Past Medical History:  Diagnosis Date   Anxiety    Bladder cancer ([place]) 1993   Being  followed by [name]   ED (erectile dysfunction)    GERD (gastroesophageal reflux disease)    Heart murmur    Hypercholesterolemia    Hyperlipidemia    Hypertension    with a component of whitecoat syndrome   Prostate cancer ([place]) 1993   Hx ostatectomyof remote treated with radial pr   Shingles    Stress incontinence    Ureteral stricture    Vertigo     Past Surgical History:  Procedure Laterality Date   BLADDER TUMOR EXCISION     CARDIOVASCULAR STRESS TEST  01-02-2003   EF 68%   COLONOSCOPY     CYSTOSCOPY     CYSTOSCOPY W/ RETROGRADES Bilateral 04/24/2016   Procedure: CYSTOSCOPY WITH BILATERAL RETROGRADE PYELOGRAM;  Surgeon: Raynelle Bring, MD;  Location: [facility];  Service: Urology;  Laterality: Bilateral;   CYSTOSTOMY W/ BLADDER BIOPSY     EYE SURGERY     INGUINAL HERNIA REPAIR     LYMPHADENECTOMY     PROSTATECTOMY  1993   TRANSURETHRAL RESECTION OF BLADDER TUMOR N/A 04/24/2016   Procedure: TRANSURETHRAL RESECTION OF  BLADDER TUMOR (TURBT);  Surgeon: Raynelle Bring, MD;  Location: [facility];  Service: Urology;  Laterality: N/A;     Current Outpatient Medications  Medication Sig Dispense Refill   amLODipine (NORVASC) 2.5 MG tablet Take 1 tablet (2.5 mg total) by mouth daily. 90 tablet 3   atorvastatin (LIPITOR) 20 MG tablet TAKE 1 TABLET(20 MG) BY MOUTH DAILY 90 tablet 3   COVID-19 mRNA bivalent vaccine, Pfizer, (PFIZER COVID-19 VAC BIVALENT) injection Inject into the muscle. 0.3 mL 0   doxycycline (VIBRA-TABS) 100 MG tablet Take 1 tablet (100 mg total) by mouth 2 (two) times daily. 20 tablet 0   furosemide (LASIX) 40 MG tablet Take 1 tablet (40 mg total) by mouth daily. 90 tablet 2   LORazepam (ATIVAN) 0.5 MG tablet TAKE 1 TABLET BY MOUTH EVERY DAY AS NEEDED FOR ANXIETY 30 tablet 3   NEOMYCIN-POLYMYXIN-HYDROCORTISONE (CORTISPORIN) 1 % SOLN OTIC solution Place 3 drops into the left ear 4 (four) times daily. 10 mL 1   potassium chloride SA (KLOR-CON) 20 MEQ tablet TAKE 1 TABLET(20 MEQ) BY MOUTH DAILY 90 tablet 2   No current facility-administered medications for this visit.    Allergies:   Ramipril    Social History:  The patient  reports that he quit smoking about 35 years ago. His smoking use included cigarettes. He has never used smokeless tobacco. He reports that he does not drink alcohol and does not use drugs.   Family History:  The patient's family history includes Arthritis/Rheumatoid in his father; Dementia in his mother; Healthy in his sister and sister.    ROS:  Please see the history of present illness.   Otherwise, review of systems are positive for none.   All other systems are reviewed and negative.   Physical Exam: Blood pressure 128/68, pulse 66, height 5\' 10"  (1.778 m), weight 178 lb 12.8 oz (81.1 kg), SpO2 98 %.  GEN:  Well nourished, well developed in no acute distress HEENT: Normal NECK: No JVD; No carotid bruits LYMPHATICS: No lymphadenopathy CARDIAC: RRR   RESPIRATORY:  Clear  to auscultation without rales, wheezing or rhonchi  ABDOMEN: Soft, non-tender, non-distended MUSCULOSKELETAL:  1+ bilateral leg edema ; No deformity  SKIN: Warm and dry NEUROLOGIC:  Alert and oriented x 3  EKG:      Recent Labs: 04/01/2021: ALT 14; Brain Natriuretic Peptide 341; BUN 37; Creat 1.93; Hemoglobin  13.7; Platelets 215; Potassium 4.0; Sodium 140    Lipid Panel    Component Value Date/Time   CHOL 137 04/01/2021 1136   CHOL 255 (H) 10/17/2017 1101   TRIG 97 04/01/2021 1136   HDL 54 04/01/2021 1136   HDL 96 10/17/2017 1101   CHOLHDL 2.5 04/01/2021 1136   VLDL 25 06/26/2016 1113   LDLCALC 65 04/01/2021 1136      Wt Readings from Last 3 Encounters:  05/16/21 178 lb 12.8 oz (81.1 kg)  04/05/21 176 lb (79.8 kg)  03/10/21 173 lb (78.5 kg)        ASSESSMENT AND PLAN:  1. essential hypertension: BP is well controlled Has some 1+ leg edema Will reduce the amlopidine to 2.5 mg QD DC HCTZ Start furosemide 40 mg a day  Cont current dose of Kdur   Gave information on the Lounge Doctor leg rest   Will have him see an APP in 6 months, I'll see him in 1 year   2. Hyperlipidemia -    Labs at Dr. Verlene Mayer office look great.   3. right bundle branch block  stable     4. Sinus bradycardia:   HR is stable    Current medicines are reviewed at length with the patient today.  The patient does not have concerns regarding medicines.  The following changes have been made:  no change  Labs/ tests ordered today include:   Orders Placed This Encounter  Procedures   Basic Metabolic Panel (BMET)   ECHOCARDIOGRAM COMPLETE     To see an APP in 6 months ,  I will see him  in a year    Mertie Moores, MD  05/16/2021 1:58 PM    [facility] [address],  [place], [place]  [address] Pager [phone] Phone: [phone]; Fax: [phone]

## 2021-05-16 ENCOUNTER — Encounter: Payer: Self-pay | Admitting: Cardiovascular Disease

## 2021-05-16 ENCOUNTER — Other Ambulatory Visit: Payer: Self-pay

## 2021-05-16 ENCOUNTER — Ambulatory Visit: Payer: Medicare Other | Admitting: Cardiovascular Disease

## 2021-05-16 VITALS — BP 128/68 | HR 66 | Ht 70.0 in | Wt 178.8 lb

## 2021-05-16 DIAGNOSIS — R600 Localized edema: Secondary | ICD-10-CM | POA: Diagnosis not present

## 2021-05-16 DIAGNOSIS — I10 Essential (primary) hypertension: Secondary | ICD-10-CM | POA: Diagnosis not present

## 2021-05-16 MED ORDER — FUROSEMIDE 40 MG PO TABS: 40.0000 mg | ORAL_TABLET | Freq: Every day | ORAL | 2 refills | Status: DC

## 2021-05-16 MED ORDER — AMLODIPINE BESYLATE 2.5 MG PO TABS: 2.5000 mg | ORAL_TABLET | Freq: Every day | ORAL | 3 refills | Status: DC

## 2021-05-16 NOTE — Patient Instructions (Addendum)
Medication Instructions:  Your physician has recommended you make the following change in your medication: Stop hydrochlorothiazide Start furosemide 40 mg by mouth daily Decrease amlodipine to 2.5 mg by mouth daily  *If you need a refill on your cardiac medications before your next appointment, please call your pharmacy*   Lab Work: Your physician recommends that you return for lab work in: 3 weeks.  BMP.  This is not fasting  If you have labs (blood work) drawn today and your tests are completely normal, you will receive your results only by: [place] (if you have MyChart) OR A paper copy in the mail If you have any lab test that is abnormal or we need to change your treatment, we will call you to review the results.   Testing/Procedures: Your physician has requested that you have an echocardiogram. Echocardiography is a painless test that uses sound waves to create images of your heart. It provides your doctor with information about the size and shape of your heart and how well your heart's chambers and valves are working. This procedure takes approximately one hour. There are no restrictions for this procedure.    Follow-Up: At [facility], you and your health needs are our priority.  As part of our continuing mission to provide you with exceptional heart care, we have created designated Provider Care Teams.  These Care Teams include your primary Cardiologist (physician) and Advanced Practice Providers (APPs -  Physician Assistants and Nurse Practitioners) who all work together to provide you with the care you need, when you need it.  We recommend signing up for the patient portal called "MyChart".  Sign up information is provided on this After Visit Summary.  MyChart is used to connect with patients for Virtual Visits (Telemedicine).  Patients are able to view lab/test results, encounter notes, upcoming appointments, etc.  Non-urgent messages can be sent to your provider as  well.   To learn more about what you can do with MyChart, go to [email or web address].    Your next appointment:   6 month(s)  The format for your next appointment:   In Person  Provider:   Robbie Lis, PA-C, Melina Copa, PA-C, Cecilie Kicks, NP, Ermalinda Barrios, PA-C, Christen Bame, NP, or Richardson Dopp, PA-C     Then, Mertie Moores, MD will plan to see you again in 12 month(s).    Other Instructions     For your  leg edema you  should do  the following 1. Leg elevation - I recommend the Lounge Dr. Leg rest.  See below for details  2. Salt restriction  -  Use potassium chloride instead of regular salt as a salt substitute. 3. Walk regularly 4. Compression hose - [facility] 5. Weight loss    Available on [email or web address] Or  Go to [email or web address]

## 2021-06-02 ENCOUNTER — Other Ambulatory Visit: Payer: Self-pay

## 2021-06-02 MED ORDER — POTASSIUM CHLORIDE CRYS ER 20 MEQ PO TBCR: EXTENDED_RELEASE_TABLET | ORAL | 3 refills | Status: DC

## 2021-06-07 ENCOUNTER — Other Ambulatory Visit: Payer: Self-pay

## 2021-06-07 ENCOUNTER — Ambulatory Visit (HOSPITAL_COMMUNITY): Payer: Medicare Other | Attending: Cardiovascular Disease

## 2021-06-07 ENCOUNTER — Other Ambulatory Visit: Payer: Medicare Other

## 2021-06-07 DIAGNOSIS — I10 Essential (primary) hypertension: Secondary | ICD-10-CM

## 2021-06-07 DIAGNOSIS — R600 Localized edema: Secondary | ICD-10-CM | POA: Insufficient documentation

## 2021-06-07 LAB — ECHOCARDIOGRAM COMPLETE
Area-P 1/2: 3.01 cm2
P 1/2 time: 670 msec
S' Lateral: 2.5 cm

## 2021-06-08 LAB — BASIC METABOLIC PANEL
BUN/Creatinine Ratio: 18 (ref 10–24)
BUN: 32 mg/dL — ABNORMAL HIGH (ref 8–27)
CO2: 19 mmol/L — ABNORMAL LOW (ref 20–29)
Calcium: 9.6 mg/dL (ref 8.6–10.2)
Chloride: 108 mmol/L — ABNORMAL HIGH (ref 96–106)
Creatinine, Ser: 1.82 mg/dL — ABNORMAL HIGH (ref 0.76–1.27)
Glucose: 93 mg/dL (ref 70–99)
Potassium: 4.3 mmol/L (ref 3.5–5.2)
Sodium: 144 mmol/L (ref 134–144)
eGFR: 35 mL/min/{1.73_m2} — ABNORMAL LOW (ref >59)

## 2021-06-09 ENCOUNTER — Telehealth: Payer: Self-pay | Admitting: Cardiovascular Disease

## 2021-06-09 NOTE — Telephone Encounter (Signed)
Pt is returning call for recent Echo results

## 2021-06-09 NOTE — Telephone Encounter (Signed)
I spoke with patient and told him we would call him once echo reviewed by Dr Acie Fredrickson

## 2021-06-17 ENCOUNTER — Telehealth: Payer: Self-pay

## 2021-06-17 DIAGNOSIS — I77819 Aortic ectasia, unspecified site: Secondary | ICD-10-CM

## 2021-06-17 DIAGNOSIS — I351 Nonrheumatic aortic (valve) insufficiency: Secondary | ICD-10-CM

## 2021-06-17 NOTE — Telephone Encounter (Signed)
-----   Message from Thayer Headings, MD sent at 06/10/2021 11:00 AM EST ----- Normal  LV  systolic function.  Grade 1 ( mild ) diastolic dysfunction Moderate  aortic insufficiency with associated ascending aorta dilatation Repeat  echo in 1 year

## 2021-06-17 NOTE — Telephone Encounter (Signed)
Spoke with patient and gave normal results of Echo and Dr Elmarie Shiley desire to repeat study in 1 year. This RN will place order for that at this time. Pt agrees to plan of care. Judson Roch, RN

## 2021-08-08 ENCOUNTER — Other Ambulatory Visit: Payer: Self-pay

## 2021-08-08 MED ORDER — ATORVASTATIN CALCIUM 20 MG PO TABS: 20.0000 mg | ORAL_TABLET | Freq: Every day | ORAL | 3 refills | Status: DC

## 2021-08-08 NOTE — Telephone Encounter (Signed)
Pt's medication was sent to pt's pharmacy as requested. Confirmation received.  °

## 2021-08-09 ENCOUNTER — Other Ambulatory Visit: Payer: Self-pay

## 2021-08-09 ENCOUNTER — Emergency Department (HOSPITAL_BASED_OUTPATIENT_CLINIC_OR_DEPARTMENT_OTHER)
Admission: EM | Admit: 2021-08-09 | Discharge: 2021-08-09 | Disposition: A | Discharge status: Home / Self Care | Payer: Medicare Other | Attending: Emergency Medicine | Admitting: Emergency Medicine

## 2021-08-09 ENCOUNTER — Encounter (HOSPITAL_BASED_OUTPATIENT_CLINIC_OR_DEPARTMENT_OTHER): Payer: Self-pay

## 2021-08-09 DIAGNOSIS — N189 Chronic kidney disease, unspecified: Secondary | ICD-10-CM | POA: Insufficient documentation

## 2021-08-09 DIAGNOSIS — R1030 Lower abdominal pain, unspecified: Secondary | ICD-10-CM | POA: Insufficient documentation

## 2021-08-09 DIAGNOSIS — R339 Retention of urine, unspecified: Secondary | ICD-10-CM

## 2021-08-09 LAB — URINALYSIS, ROUTINE W REFLEX MICROSCOPIC
Bilirubin Urine: NEGATIVE
Glucose, UA: NEGATIVE mg/dL
Ketones, ur: NEGATIVE mg/dL
Leukocytes,Ua: NEGATIVE
Nitrite: NEGATIVE
Protein, ur: 30 mg/dL — AB
Specific Gravity, Urine: 1.01 (ref 1.005–1.030)
pH: 6 (ref 5.0–8.0)

## 2021-08-09 LAB — CBC
HCT: 37.8 % — ABNORMAL LOW (ref 39.0–52.0)
Hemoglobin: 12.6 g/dL — ABNORMAL LOW (ref 13.0–17.0)
MCH: 30.1 pg (ref 26.0–34.0)
MCHC: 33.3 g/dL (ref 30.0–36.0)
MCV: 90.2 fL (ref 80.0–100.0)
Platelets: 177 10*3/uL (ref 150–400)
RBC: 4.19 MIL/uL — ABNORMAL LOW (ref 4.22–5.81)
RDW: 13.8 % (ref 11.5–15.5)
WBC: 9.6 10*3/uL (ref 4.0–10.5)
nRBC: 0 % (ref 0.0–0.2)

## 2021-08-09 LAB — BASIC METABOLIC PANEL
Anion gap: 11 (ref 5–15)
BUN: 32 mg/dL — ABNORMAL HIGH (ref 8–23)
CO2: 21 mmol/L — ABNORMAL LOW (ref 22–32)
Calcium: 8.9 mg/dL (ref 8.9–10.3)
Chloride: 101 mmol/L (ref 98–111)
Creatinine, Ser: 2.11 mg/dL — ABNORMAL HIGH (ref 0.61–1.24)
GFR, Estimated: 30 mL/min — ABNORMAL LOW (ref >60)
Glucose, Bld: 90 mg/dL (ref 70–99)
Potassium: 3.5 mmol/L (ref 3.5–5.1)
Sodium: 133 mmol/L — ABNORMAL LOW (ref 135–145)

## 2021-08-09 NOTE — ED Notes (Signed)
Bladder Scan: 500 mL.

## 2021-08-09 NOTE — ED Notes (Signed)
This RN attempted to insert a 16Fr Foley Catheter with No Success due to Resistance. Primary RN made aware. Patient tolerated Procedure well.

## 2021-08-09 NOTE — ED Triage Notes (Signed)
Patient here POV from Home with Urinary Retention.  Patient states that he had a procedure completed on Friday with [facility] where "they went in my bladder to look".   Uncomplicated and no Difficulty initially voiding but endorses last urinating at 1430 and has not voided since.  No Fevers. No Diarrhea.  NAD Noted during Triage. A&Ox4. GCS 15. Ambulatory.

## 2021-08-09 NOTE — ED Provider Notes (Signed)
[facility] DEPT  Provider Note  CSN: [id number] Arrival date & time: 08/09/21 2107  History Chief Complaint  Patient presents with   Urinary Retention    Robert Bright is a 88 y.o. male with a prior history of prostatectomy, still followed by Urology who he saw 5 days ago during which he had a cystoscopy and had 'two things zapped' that he was told looked benign. He was doing fine until this evening when he was unable to urinate. Last able to void around 2pm today. Complaining of suprapubic pressure. Found to have large volume on bladder scan and foley placed prior to my evaluation with relief of symptoms.    Home Medications Prior to Admission medications   Medication Sig Start Date End Date Taking? Authorizing Provider  amLODipine (NORVASC) 2.5 MG tablet Take 1 tablet (2.5 mg total) by mouth daily. 05/16/21   Nahser, Wonda Cheng, MD  atorvastatin (LIPITOR) 20 MG tablet Take 1 tablet (20 mg total) by mouth daily. 08/08/21   Nahser, Wonda Cheng, MD  furosemide (LASIX) 40 MG tablet Take 1 tablet (40 mg total) by mouth daily. 05/16/21   Nahser, Wonda Cheng, MD  LORazepam (ATIVAN) 0.5 MG tablet TAKE 1 TABLET BY MOUTH EVERY DAY AS NEEDED FOR ANXIETY 02/23/21   Baxley, Cresenciano Lick, MD  NEOMYCIN-POLYMYXIN-HYDROCORTISONE (CORTISPORIN) 1 % SOLN OTIC solution Place 3 drops into the left ear 4 (four) times daily. 10/03/18   Elby Showers, MD  potassium chloride SA (KLOR-CON M) 20 MEQ tablet TAKE 1 TABLET(20 MEQ) BY MOUTH DAILY 06/02/21   Nahser, Wonda Cheng, MD     Allergies    Ramipril   Review of Systems   Review of Systems Please see HPI for pertinent positives and negatives  Physical Exam BP (!) 151/64    Pulse (!) 56    Temp 98 F (36.7 C)    Resp 18    Ht 5\' 10"  (1.778 m)    Wt 81.1 kg    SpO2 98%    BMI 25.65 kg/m   Physical Exam Vitals and nursing note reviewed.  Constitutional:      Appearance: Normal appearance.  HENT:     Head: Normocephalic and atraumatic.      Nose: Nose normal.     Mouth/Throat:     Mouth: Mucous membranes are moist.  Eyes:     Extraocular Movements: Extraocular movements intact.     Conjunctiva/sclera: Conjunctivae normal.  Cardiovascular:     Rate and Rhythm: Normal rate.  Pulmonary:     Effort: Pulmonary effort is normal.     Breath sounds: Normal breath sounds.  Abdominal:     General: Abdomen is flat.     Palpations: Abdomen is soft.     Tenderness: There is no abdominal tenderness.  Musculoskeletal:        General: No swelling. Normal range of motion.     Cervical back: Neck supple.  Skin:    General: Skin is warm and dry.  Neurological:     General: No focal deficit present.     Mental Status: He is alert.  Psychiatric:        Mood and Affect: Mood normal.    ED Results / Procedures / Treatments   EKG None  Procedures Procedures  Medications Ordered in the ED Medications - No data to display  Initial Impression and Plan  Patient with acute urinary retention, relatively recent instrumentation but had been voiding well several days afterward. Urine  is clear now. Will check UA, CBC, BMP for signs of infection or AKI.   ED Course   Clinical Course as of 08/09/21 2300  Tue Aug 09, 2021  2248 UA without signs of infection. CBC is normal.  [CS]  2250 BMP with CKD about at baseline. Doubt this is due to urinary retention and at this point the obstruction is relieved. Will transition to a leg bag and plan discharge home with outpatient Urology follow up for removal.  [CS]    Clinical Course User Index [CS] Truddie Hidden, MD     MDM Rules/Calculators/A&P Medical Decision Making Given presenting complaint, I considered that admission might be necessary. After review of results from ED lab and/or imaging studies, admission to the hospital is not indicated at this time.    Problems Addressed: Chronic kidney disease, unspecified CKD stage: chronic illness or injury Urinary retention: complicated  acute illness or injury  Amount and/or Complexity of Data Reviewed Labs: ordered. Decision-making details documented in ED Course.  Risk Decision regarding hospitalization.    Final Clinical Impression(s) / ED Diagnoses Final diagnoses:  Urinary retention  Chronic kidney disease, unspecified CKD stage    Rx / DC Orders ED Discharge Orders     None        Truddie Hidden, MD 08/09/21 2300

## 2021-08-14 ENCOUNTER — Inpatient Hospital Stay (HOSPITAL_BASED_OUTPATIENT_CLINIC_OR_DEPARTMENT_OTHER)
Admission: EM | Admit: 2021-08-14 | Discharge: 2021-08-20 | DRG: 872 | Disposition: A | Discharge status: Home Health | Payer: Medicare Other | Attending: Family Medicine | Admitting: Family Medicine

## 2021-08-14 ENCOUNTER — Encounter (HOSPITAL_BASED_OUTPATIENT_CLINIC_OR_DEPARTMENT_OTHER): Payer: Self-pay | Admitting: Emergency Medicine

## 2021-08-14 ENCOUNTER — Emergency Department (HOSPITAL_BASED_OUTPATIENT_CLINIC_OR_DEPARTMENT_OTHER): Payer: Medicare Other

## 2021-08-14 ENCOUNTER — Other Ambulatory Visit: Payer: Self-pay

## 2021-08-14 DIAGNOSIS — D696 Thrombocytopenia, unspecified: Secondary | ICD-10-CM | POA: Diagnosis present

## 2021-08-14 DIAGNOSIS — K219 Gastro-esophageal reflux disease without esophagitis: Secondary | ICD-10-CM | POA: Diagnosis present

## 2021-08-14 DIAGNOSIS — Z888 Allergy status to other drugs, medicaments and biological substances status: Secondary | ICD-10-CM

## 2021-08-14 DIAGNOSIS — Z8546 Personal history of malignant neoplasm of prostate: Secondary | ICD-10-CM

## 2021-08-14 DIAGNOSIS — E785 Hyperlipidemia, unspecified: Secondary | ICD-10-CM | POA: Diagnosis present

## 2021-08-14 DIAGNOSIS — N368 Other specified disorders of urethra: Secondary | ICD-10-CM | POA: Diagnosis present

## 2021-08-14 DIAGNOSIS — F419 Anxiety disorder, unspecified: Secondary | ICD-10-CM | POA: Diagnosis present

## 2021-08-14 DIAGNOSIS — N3289 Other specified disorders of bladder: Secondary | ICD-10-CM | POA: Diagnosis present

## 2021-08-14 DIAGNOSIS — B9561 Methicillin susceptible Staphylococcus aureus infection as the cause of diseases classified elsewhere: Secondary | ICD-10-CM | POA: Diagnosis not present

## 2021-08-14 DIAGNOSIS — Z87891 Personal history of nicotine dependence: Secondary | ICD-10-CM | POA: Diagnosis not present

## 2021-08-14 DIAGNOSIS — R339 Retention of urine, unspecified: Secondary | ICD-10-CM | POA: Diagnosis not present

## 2021-08-14 DIAGNOSIS — N390 Urinary tract infection, site not specified: Secondary | ICD-10-CM

## 2021-08-14 DIAGNOSIS — R6520 Severe sepsis without septic shock: Secondary | ICD-10-CM | POA: Diagnosis not present

## 2021-08-14 DIAGNOSIS — N1832 Chronic kidney disease, stage 3b: Secondary | ICD-10-CM | POA: Diagnosis present

## 2021-08-14 DIAGNOSIS — A419 Sepsis, unspecified organism: Secondary | ICD-10-CM | POA: Diagnosis present

## 2021-08-14 DIAGNOSIS — Z8551 Personal history of malignant neoplasm of bladder: Secondary | ICD-10-CM | POA: Diagnosis not present

## 2021-08-14 DIAGNOSIS — I4510 Unspecified right bundle-branch block: Secondary | ICD-10-CM | POA: Diagnosis present

## 2021-08-14 DIAGNOSIS — I10 Essential (primary) hypertension: Secondary | ICD-10-CM | POA: Diagnosis present

## 2021-08-14 DIAGNOSIS — N136 Pyonephrosis: Secondary | ICD-10-CM | POA: Diagnosis present

## 2021-08-14 DIAGNOSIS — I455 Other specified heart block: Secondary | ICD-10-CM | POA: Diagnosis present

## 2021-08-14 DIAGNOSIS — N179 Acute kidney failure, unspecified: Secondary | ICD-10-CM | POA: Diagnosis present

## 2021-08-14 DIAGNOSIS — I129 Hypertensive chronic kidney disease with stage 1 through stage 4 chronic kidney disease, or unspecified chronic kidney disease: Secondary | ICD-10-CM | POA: Diagnosis present

## 2021-08-14 DIAGNOSIS — Z66 Do not resuscitate: Secondary | ICD-10-CM | POA: Diagnosis present

## 2021-08-14 DIAGNOSIS — R7881 Bacteremia: Secondary | ICD-10-CM | POA: Diagnosis not present

## 2021-08-14 DIAGNOSIS — Y738 Miscellaneous gastroenterology and urology devices associated with adverse incidents, not elsewhere classified: Secondary | ICD-10-CM | POA: Diagnosis present

## 2021-08-14 DIAGNOSIS — Z20822 Contact with and (suspected) exposure to covid-19: Secondary | ICD-10-CM | POA: Diagnosis present

## 2021-08-14 DIAGNOSIS — Z9079 Acquired absence of other genital organ(s): Secondary | ICD-10-CM | POA: Diagnosis not present

## 2021-08-14 DIAGNOSIS — R319 Hematuria, unspecified: Secondary | ICD-10-CM | POA: Diagnosis not present

## 2021-08-14 DIAGNOSIS — T83021A Displacement of indwelling urethral catheter, initial encounter: Secondary | ICD-10-CM | POA: Diagnosis present

## 2021-08-14 DIAGNOSIS — Z79899 Other long term (current) drug therapy: Secondary | ICD-10-CM

## 2021-08-14 LAB — URINALYSIS, ROUTINE W REFLEX MICROSCOPIC
Bilirubin Urine: NEGATIVE
Glucose, UA: NEGATIVE mg/dL
Ketones, ur: NEGATIVE mg/dL
Nitrite: NEGATIVE
Protein, ur: 100 mg/dL — AB
RBC / HPF: >50 RBC/hpf — ABNORMAL HIGH (ref 0–5)
Specific Gravity, Urine: 1.015 (ref 1.005–1.030)
WBC, UA: >50 WBC/hpf — ABNORMAL HIGH (ref 0–5)
pH: 7.5 (ref 5.0–8.0)

## 2021-08-14 LAB — PROTIME-INR
INR: 1.1 (ref 0.8–1.2)
Prothrombin Time: 14.1 seconds (ref 11.4–15.2)

## 2021-08-14 LAB — CBC WITH DIFFERENTIAL/PLATELET
Abs Immature Granulocytes: 0.19 10*3/uL — ABNORMAL HIGH (ref 0.00–0.07)
Basophils Absolute: 0 10*3/uL (ref 0.0–0.1)
Basophils Relative: 0 %
Eosinophils Absolute: 0 10*3/uL (ref 0.0–0.5)
Eosinophils Relative: 0 %
HCT: 36.3 % — ABNORMAL LOW (ref 39.0–52.0)
Hemoglobin: 11.9 g/dL — ABNORMAL LOW (ref 13.0–17.0)
Immature Granulocytes: 1 %
Lymphocytes Relative: 2 %
Lymphs Abs: 0.3 10*3/uL — ABNORMAL LOW (ref 0.7–4.0)
MCH: 30.1 pg (ref 26.0–34.0)
MCHC: 32.8 g/dL (ref 30.0–36.0)
MCV: 91.7 fL (ref 80.0–100.0)
Monocytes Absolute: 0.3 10*3/uL (ref 0.1–1.0)
Monocytes Relative: 2 %
Neutro Abs: 14.3 10*3/uL — ABNORMAL HIGH (ref 1.7–7.7)
Neutrophils Relative %: 95 %
Platelets: 142 10*3/uL — ABNORMAL LOW (ref 150–400)
RBC: 3.96 MIL/uL — ABNORMAL LOW (ref 4.22–5.81)
RDW: 14.1 % (ref 11.5–15.5)
WBC: 15.1 10*3/uL — ABNORMAL HIGH (ref 4.0–10.5)
nRBC: 0 % (ref 0.0–0.2)

## 2021-08-14 LAB — COMPREHENSIVE METABOLIC PANEL WITH GFR
ALT: 36 U/L (ref 0–44)
AST: 61 U/L — ABNORMAL HIGH (ref 15–41)
Albumin: 3.5 g/dL (ref 3.5–5.0)
Alkaline Phosphatase: 101 U/L (ref 38–126)
Anion gap: 14 (ref 5–15)
BUN: 41 mg/dL — ABNORMAL HIGH (ref 8–23)
CO2: 19 mmol/L — ABNORMAL LOW (ref 22–32)
Calcium: 9.1 mg/dL (ref 8.9–10.3)
Chloride: 104 mmol/L (ref 98–111)
Creatinine, Ser: 2.66 mg/dL — ABNORMAL HIGH (ref 0.61–1.24)
GFR, Estimated: 22 mL/min — ABNORMAL LOW
Glucose, Bld: 92 mg/dL (ref 70–99)
Potassium: 3.4 mmol/L — ABNORMAL LOW (ref 3.5–5.1)
Sodium: 137 mmol/L (ref 135–145)
Total Bilirubin: 0.8 mg/dL (ref 0.3–1.2)
Total Protein: 6.6 g/dL (ref 6.5–8.1)

## 2021-08-14 LAB — APTT: aPTT: 25 s (ref 24–36)

## 2021-08-14 LAB — RESP PANEL BY RT-PCR (FLU A&B, COVID) ARPGX2
Influenza A by PCR: NEGATIVE
Influenza B by PCR: NEGATIVE
SARS Coronavirus 2 by RT PCR: NEGATIVE

## 2021-08-14 LAB — LACTIC ACID, PLASMA
Lactic Acid, Venous: 1.6 mmol/L (ref 0.5–1.9)
Lactic Acid, Venous: 2.2 mmol/L (ref 0.5–1.9)

## 2021-08-14 MED ORDER — LACTATED RINGERS IV BOLUS (SEPSIS)
1000.0000 mL | Freq: Once | INTRAVENOUS | Status: AC
  Administered 2021-08-14: 1000 mL via INTRAVENOUS

## 2021-08-14 MED ORDER — LACTATED RINGERS IV BOLUS (SEPSIS)
500.0000 mL | Freq: Once | INTRAVENOUS | Status: AC
  Administered 2021-08-14: 500 mL via INTRAVENOUS

## 2021-08-14 MED ORDER — SODIUM CHLORIDE 0.9 % IV SOLN
2.0000 g | INTRAVENOUS | Status: DC
  Administered 2021-08-14: 2 g via INTRAVENOUS
  Filled 2021-08-14 (×2): qty 2

## 2021-08-14 MED ORDER — ATORVASTATIN CALCIUM 20 MG PO TABS
20.0000 mg | ORAL_TABLET | Freq: Every day | ORAL | Status: DC
  Administered 2021-08-15 – 2021-08-20 (×6): 20 mg via ORAL
  Filled 2021-08-14 (×6): qty 1

## 2021-08-14 MED ORDER — HEPARIN SODIUM (PORCINE) 5000 UNIT/ML IJ SOLN
5000.0000 [IU] | Freq: Three times a day (TID) | INTRAMUSCULAR | Status: DC
  Administered 2021-08-14 – 2021-08-15 (×2): 5000 [IU] via SUBCUTANEOUS
  Filled 2021-08-14 (×3): qty 1

## 2021-08-14 MED ORDER — POTASSIUM CHLORIDE CRYS ER 20 MEQ PO TBCR
20.0000 meq | EXTENDED_RELEASE_TABLET | Freq: Once | ORAL | Status: AC
  Administered 2021-08-14: 20 meq via ORAL
  Filled 2021-08-14: qty 1

## 2021-08-14 MED ORDER — AMLODIPINE BESYLATE 5 MG PO TABS
2.5000 mg | ORAL_TABLET | Freq: Every day | ORAL | Status: DC
Start: 2021-08-15 — End: 2021-08-20
  Administered 2021-08-15 – 2021-08-20 (×6): 2.5 mg via ORAL
  Filled 2021-08-14 (×6): qty 1

## 2021-08-14 MED ORDER — LACTATED RINGERS IV SOLN: INTRAVENOUS | Status: DC

## 2021-08-14 MED ORDER — ACETAMINOPHEN 325 MG PO TABS
650.0000 mg | ORAL_TABLET | Freq: Four times a day (QID) | ORAL | Status: DC | PRN
  Administered 2021-08-15 – 2021-08-19 (×5): 650 mg via ORAL
  Filled 2021-08-14 (×5): qty 2

## 2021-08-14 MED ORDER — LORAZEPAM 0.5 MG PO TABS
0.5000 mg | ORAL_TABLET | Freq: Every day | ORAL | Status: DC | PRN
  Administered 2021-08-15 – 2021-08-19 (×5): 0.5 mg via ORAL
  Filled 2021-08-14 (×5): qty 1

## 2021-08-14 MED ORDER — SENNOSIDES-DOCUSATE SODIUM 8.6-50 MG PO TABS
1.0000 | ORAL_TABLET | Freq: Every evening | ORAL | Status: DC | PRN
  Administered 2021-08-16: 1 via ORAL
  Filled 2021-08-14: qty 1

## 2021-08-14 MED ORDER — CEFEPIME HCL 2 G IJ SOLR: 2.0000 g | Freq: Once | INTRAMUSCULAR | Status: DC

## 2021-08-14 MED ORDER — ACETAMINOPHEN 325 MG PO TABS
650.0000 mg | ORAL_TABLET | Freq: Once | ORAL | Status: AC
Start: 2021-08-14 — End: 2021-08-14
  Administered 2021-08-14: 650 mg via ORAL
  Filled 2021-08-14: qty 2

## 2021-08-14 MED ORDER — ACETAMINOPHEN 650 MG RE SUPP: 650.0000 mg | Freq: Four times a day (QID) | RECTAL | Status: DC | PRN

## 2021-08-14 NOTE — ED Notes (Signed)
Bladder scan showing 621 ml of urine in bladder.

## 2021-08-14 NOTE — Progress Notes (Signed)
Plan of Care Note for accepted transfer   Patient: Robert Bright MRN: [id number]   DOA: 08/14/2021  Facility requesting transfer: [facility] Requesting Provider: PA Lannette Donath Reason for transfer: Sepsis secondary to UTI Facility course:   88 year old male with past medical history of gastroesophageal reflux disease, hypertension, hyperlipidemia, prostate cancer, bladder cancer (follows with [name] with Urology), who presents to [facility] with complaints of dysuria.  Patient explains that earlier in the month he had a regularly scheduled appointment with his urologist [name].  During that visit patient underwent a cystoscopy.  Several days later, patient presented to [facility] on 2/21 with complaints of urinary retention.  A Foley catheter was placed and the patient was discharged home with instructions to follow-up as an outpatient with urology.  In the days that followed, patient began to develop increasing lower lower abdominal discomfort and dysuria with decreasing urine output in his Foley bag.  Patient presented once again to [facility] for evaluation.  Upon evaluation in the emergency department CT imaging of the abdomen and pelvis revealed marked distention of the urinary bladder with the Foley catheter tip in the prostatic urethra and the balloon inflated in the urethra.  Furthermore, patient was found to have a urinalysis highly suggestive of urinary tract infection with concurrent leukocytosis of 15.1 and acute kidney injury with creatinine of 2.66, up from baseline of 1.8.  Patient was additionally found to have a lactic acidosis of 2.2.  Patient was initiated on intravenous fluids and intravenous cefepime.  The hospitalist group is now been called to assess the patient for transfer and continuance of medical care.  Plan of care: The patient is accepted for admission to Telemetry unit, at [facility]..    Author: Vernelle Emerald,  MD 08/14/2021  Check [email or web address] for on-call coverage.  Nursing staff, Please call [place] number on Amion as soon as patient's arrival, so appropriate admitting provider can evaluate the pt.

## 2021-08-14 NOTE — ED Triage Notes (Signed)
Dysuria x 2 days , urinary catheter applied on 2/21, presents with leg bag. Denies further symptoms .

## 2021-08-14 NOTE — Progress Notes (Signed)
Pharmacy Antibiotic Note  Robert Bright is a 88 y.o. male admitted on 08/14/2021 with sepsis.  WBC 15.1 and tmax 100.8. Pharmacy has been consulted for cefepime dosing.  Plan: Cefepime 2g q24h x 7 days F/u renal function, clinical status and length of therapy    Temp (24hrs), Avg:100.8 F (38.2 C), Min:100.3 F (37.9 C), Max:101.2 F (38.4 C)  Recent Labs  Lab 08/09/21 2217 08/14/21 1515  WBC 9.6 15.1*  CREATININE 2.11*  --     Estimated Creatinine Clearance: 25 mL/min (A) (by C-G formula based on SCr of 2.11 mg/dL (H)).    Allergies  Allergen Reactions   Ramipril     cough    Antimicrobials this admission: Cefepime 2/26>   Microbiology: 2/26 Ucx pending  2/26 Bcx pending  Thank you for allowing pharmacy to be a part of this patients care.  Levonne Spiller 08/14/2021 4:19 PM

## 2021-08-14 NOTE — ED Notes (Signed)
Patient transported to CT 

## 2021-08-14 NOTE — ED Notes (Signed)
CRITICAL VALUE STICKER  CRITICAL VALUE:  RECEIVER (on-site recipient of call):Rudene Poulsen,rn  DATE & TIME NOTIFIED: 08/14/2021 1841  MESSENGER (representative from lab):  MD NOTIFIED: tegeler  TIME OF NOTIFICATION:1841  RESPONSE:

## 2021-08-14 NOTE — ED Provider Notes (Signed)
[facility] EMERGENCY DEPT Provider Note   CSN: [id number] Arrival date & time: 08/14/21  1341     History  Chief Complaint  Patient presents with   Dysuria    Robert Bright is a 88 y.o. male with a past medical history of hypertension, hyperlipidemia, bladder cancer, prostate cancer, ureteral stricture.  Presents emergency department with a chief complaint of suprapubic abdominal pain.  Patient reports that he has been having suprapubic tenderness over the last few days.  Pain comes on intermittently.  Patient reports that today he had worsening of pain which prompted him to come to the emergency department.  Patient reports that he also noticed decreased urine output from his catheter today.  Additionally patient endorses fevers, chills, and nausea.  Patient reports taking Tylenol for his fever earlier today.  Patient reports that he has cystoscopy procedure performed on 2/17.  Seen in the emergency department on 2/21 for urinary retention and had a Foley catheter placed.  Patient denies any vomiting, blood in stool, melena, hematuria, lightheadedness, dizziness, syncope.   Dysuria Presenting symptoms: no dysuria, no penile discharge and no penile pain   Associated symptoms: abdominal pain, fever and nausea   Associated symptoms: no diarrhea, no flank pain, no hematuria, no penile swelling, no scrotal swelling and no vomiting       Home Medications Prior to Admission medications   Medication Sig Start Date End Date Taking? Authorizing Provider  amLODipine (NORVASC) 2.5 MG tablet Take 1 tablet (2.5 mg total) by mouth daily. 05/16/21   Nahser, Wonda Cheng, MD  atorvastatin (LIPITOR) 20 MG tablet Take 1 tablet (20 mg total) by mouth daily. 08/08/21   Nahser, Wonda Cheng, MD  furosemide (LASIX) 40 MG tablet Take 1 tablet (40 mg total) by mouth daily. 05/16/21   Nahser, Wonda Cheng, MD  LORazepam (ATIVAN) 0.5 MG tablet TAKE 1 TABLET BY MOUTH EVERY DAY AS NEEDED FOR ANXIETY  02/23/21   Baxley, Cresenciano Lick, MD  NEOMYCIN-POLYMYXIN-HYDROCORTISONE (CORTISPORIN) 1 % SOLN OTIC solution Place 3 drops into the left ear 4 (four) times daily. 10/03/18   Elby Showers, MD  potassium chloride SA (KLOR-CON M) 20 MEQ tablet TAKE 1 TABLET(20 MEQ) BY MOUTH DAILY 06/02/21   Nahser, Wonda Cheng, MD      Allergies    Ramipril    Review of Systems   Review of Systems  Constitutional:  Positive for chills and fever.  Eyes:  Negative for visual disturbance.  Respiratory:  Negative for shortness of breath.   Cardiovascular:  Negative for chest pain.  Gastrointestinal:  Positive for abdominal pain and nausea. Negative for abdominal distention, anal bleeding, blood in stool, constipation, diarrhea, rectal pain and vomiting.  Genitourinary:  Positive for decreased urine volume. Negative for difficulty urinating, dysuria, flank pain, genital sores, hematuria, penile discharge, penile pain, penile swelling, scrotal swelling and testicular pain.  Musculoskeletal:  Negative for back pain and neck pain.  Skin:  Negative for color change and rash.  Neurological:  Negative for dizziness, syncope, light-headedness and headaches.  Psychiatric/Behavioral:  Negative for confusion.    Physical Exam Updated Vital Signs BP 119/72    Pulse 86    Temp (!) 101.2 F (38.4 C) (Rectal)    Resp 18    SpO2 95%  Physical Exam Vitals and nursing note reviewed.  Constitutional:      General: He is not in acute distress.    Appearance: He is ill-appearing. He is not toxic-appearing or diaphoretic.  HENT:  Head: Normocephalic.  Eyes:     General: No scleral icterus.       Right eye: No discharge.        Left eye: No discharge.  Cardiovascular:     Rate and Rhythm: Normal rate.  Pulmonary:     Effort: Pulmonary effort is normal. No tachypnea or respiratory distress.     Breath sounds: Normal breath sounds. No stridor.     Comments: Speaks in full complete sentences without difficulty Abdominal:      General: Abdomen is flat. Bowel sounds are normal. There is no distension. There are no signs of injury.     Palpations: Abdomen is soft. There is no mass or pulsatile mass.     Tenderness: There is abdominal tenderness in the suprapubic area. There is no guarding or rebound.     Hernia: There is no hernia in the umbilical area or ventral area.  Skin:    General: Skin is warm and dry.  Neurological:     General: No focal deficit present.     Mental Status: He is alert.  Psychiatric:        Behavior: Behavior is cooperative.    ED Results / Procedures / Treatments   Labs (all labs ordered are listed, but only abnormal results are displayed) Labs Reviewed  LACTIC ACID, PLASMA - Abnormal; Notable for the following components:      Result Value   Lactic Acid, Venous 2.2 (*)    All other components within normal limits  COMPREHENSIVE METABOLIC PANEL - Abnormal; Notable for the following components:   Potassium 3.4 (*)    CO2 19 (*)    BUN 41 (*)    Creatinine, Ser 2.66 (*)    AST 61 (*)    GFR, Estimated 22 (*)    All other components within normal limits  CBC WITH DIFFERENTIAL/PLATELET - Abnormal; Notable for the following components:   WBC 15.1 (*)    RBC 3.96 (*)    Hemoglobin 11.9 (*)    HCT 36.3 (*)    Platelets 142 (*)    Neutro Abs 14.3 (*)    Lymphs Abs 0.3 (*)    Abs Immature Granulocytes 0.19 (*)    All other components within normal limits  URINALYSIS, ROUTINE W REFLEX MICROSCOPIC - Abnormal; Notable for the following components:   APPearance HAZY (*)    Hgb urine dipstick LARGE (*)    Protein, ur 100 (*)    Leukocytes,Ua LARGE (*)    RBC / HPF >50 (*)    WBC, UA >50 (*)    Bacteria, UA RARE (*)    All other components within normal limits  RESP PANEL BY RT-PCR (FLU A&B, COVID) ARPGX2  CULTURE, BLOOD (ROUTINE X 2)  CULTURE, BLOOD (ROUTINE X 2)  URINE CULTURE  LACTIC ACID, PLASMA  PROTIME-INR  APTT    EKG EKG Interpretation  Date/Time:  Sunday August 14 2021 15:32:28 EST Ventricular Rate:  85 PR Interval:  252 QRS Duration: 145 QT Interval:  412 QTC Calculation: 490 R Axis:   -32 Text Interpretation: Sinus rhythm Prolonged PR interval Right bundle branch block when compared to prior,  faster rate. No STEMI Confirmed by Antony Blackbird [phone]) on 08/14/2021 4:33:47 PM  Radiology CT ABDOMEN PELVIS WO CONTRAST  Result Date: 08/14/2021 CLINICAL DATA:  Suprapubic tenderness.  Dysuria for 2 days. EXAM: CT ABDOMEN AND PELVIS WITHOUT CONTRAST TECHNIQUE: Multidetector CT imaging of the abdomen and pelvis was performed following the standard  protocol without IV contrast. RADIATION DOSE REDUCTION: This exam was performed according to the departmental dose-optimization program which includes automated exposure control, adjustment of the mA and/or kV according to patient size and/or use of iterative reconstruction technique. COMPARISON:  05/23/2006 FINDINGS: Lower chest: Minimal subsegmental atelectasis at the lung bases. Coronary artery calcifications. Hepatobiliary: The gallbladder is present.  No focal liver lesion. Pancreas: Unremarkable. No pancreatic ductal dilatation or surrounding inflammatory changes. Spleen: Normal in size without focal abnormality. Adrenals/Urinary Tract: Adrenal glands are normal. RIGHT kidney: There are numerous cysts throughout the RIGHT kidney, largest proximally 6.9 centimeters. Renal parenchymal thinning is present. There is RIGHT ureteropelvic junction obstruction. RIGHT ureter is normal. LEFT kidney: Numerous cysts are present. No hydronephrosis. LEFT ureter is unremarkable. Urinary bladder is distended. The Foley balloon is within the proximal penile urethra. Numerous clips are identified LATERAL and posterior to the urinary bladder. Stomach/Bowel: Stomach is normal. Small duodenal diverticulum. Small bowel loops are normal in appearance. There is extensive colonic diverticulosis. No acute diverticulitis. Appendix is not well  seen. No inflammatory changes in the RIGHT LOWER QUADRANT. Vascular/Lymphatic: There is dense atherosclerotic calcification of the abdominal aorta. No associated aneurysm. Although involved by atherosclerosis, there is vascular opacification of the celiac axis, superior mesenteric artery, and inferior mesenteric artery. Normal appearance of the portal venous system and inferior vena cava. No retroperitoneal or mesenteric adenopathy. Reproductive: Prior prostatectomy. Other: Small fat containing paraumbilical hernia.  No ascites. Musculoskeletal: Moderate degenerative changes and scoliosis of the thoracolumbar spine. No acute abnormality. IMPRESSION: 1. Marked distension of the urinary bladder. The Foley catheter tip is within the prostatic urethra, balloon inflated in the posterior penile urethra. Recommend repositioning of Foley catheter after deflating the balloon. 2. Postoperative changes in the pelvis surrounding the urinary bladder. 3. Polycystic kidneys. RIGHT ureteropelvic junction obstruction and RIGHT renal parenchymal thinning. 4. Extensive colonic diverticulosis without acute diverticulitis. 5.  Aortic atherosclerosis.  (ICD10-I70.0) 6. Prostatectomy. These results were called by telephone at the time of interpretation on 08/14/2021 at 6:06 pm to provider [facility] , who verbally acknowledged these results. Electronically Signed   By: Nolon Nations M.D.   On: 08/14/2021 18:06   DG Chest Port 1 View  Result Date: 08/14/2021 CLINICAL DATA:  Incontinence Questionable sepsis - evaluate for abnormality EXAM: PORTABLE CHEST 1 VIEW COMPARISON:  Radiograph 08/17/2010 FINDINGS: Normal cardiac silhouette with ectatic aorta. Rounded RIGHT hilar density superimposed on the ascending thoracic aorta shadow measuring 2 cm. Atherosclerotic calcification of the aorta. No acute osseous abnormality. IMPRESSION: 1. No acute cardiopulmonary findings. 2. Rounded density at the RIGHT hilum may represent vascular  shadow. Cannot exclude pulmonary nodule. Consider CT thorax without contrast Electronically Signed   By: Suzy Bouchard M.D.   On: 08/14/2021 14:39    Procedures .Critical Care Performed by: Loni Beckwith, PA-C Authorized by: Loni Beckwith, PA-C   Critical care provider statement:    Critical care time (minutes):  30   Critical care was necessary to treat or prevent imminent or life-threatening deterioration of the following conditions:  Sepsis   Critical care was time spent personally by me on the following activities:  Development of treatment plan with patient or surrogate, evaluation of patient's response to treatment, examination of patient, ordering and review of laboratory studies, ordering and review of radiographic studies, ordering and performing treatments and interventions, pulse oximetry, re-evaluation of patient's condition, review of old charts and obtaining history from patient or surrogate   Care discussed with: admitting provider  Medications Ordered in ED Medications  lactated ringers infusion (has no administration in time range)  lactated ringers bolus 1,000 mL (has no administration in time range)    And  lactated ringers bolus 1,000 mL (has no administration in time range)    And  lactated ringers bolus 500 mL (has no administration in time range)  ceFEPIme (MAXIPIME) 2 g in sodium chloride 0.9 % 100 mL IVPB (has no administration in time range)  acetaminophen (TYLENOL) tablet 650 mg (650 mg Oral Given 08/14/21 1606)    ED Course/ Medical Decision Making/ A&P Clinical Course as of 08/15/21 0039  Sun Aug 14, 2021  1936 Spoke to hospitalist Dr. Cyd Silence who will see the patient for admission [PB]    Clinical Course User Index [PB] Loni Beckwith, PA-C                           Medical Decision Making Amount and/or Complexity of Data Reviewed Labs: ordered. Radiology: ordered. ECG/medicine tests: ordered.  Risk OTC  drugs. Prescription drug management. Decision regarding hospitalization.   Alert 88 year old male no acute distress, nontoxic-appearing.  Presents the emergency department with a chief complaint of suprapubic abdominal pain.  Information was obtained from patient and patient's neighbor at bedside.  Past medical records were reviewed including previous provider notes, labs, and imaging.  Patient has past medical history as noted in HPI which complicates his care.  Patient noted to be febrile and tachycardic upon arrival.  Will initiate ED evolving sepsis.  Suspect possible urinary source due to recent cystoscopy and Foley insertion.  Patient given Tylenol at this time.  We will hold fluid bolus and antibiotics until sepsis is confirmed.  X-ray was independently viewed and interpreted by myself.  It shows no acute cardiopulmonary abnormality.  Possible right pulmonary nodule.  Labs were ordered and independently interpreted myself.  Pertinent findings include: -Leukocytosis at 15.1 -Creatinine 2.66 which is increased from 2.115 days prior -UA shows bacteria rare, WBC greater than 50, RBC greater than 50, leukocyte large, nitrite negative. -Lactic acid 1.6 and 2.2  With rectal temperature of 101.2, leukocytosis, and urinary source of infection we will start patient on cefepime as well as 30 mL/kg fluid bolus of LR.  Additionally will obtain noncontrast CT of abdomen and pelvis to evaluate for possible obstructive pathology.  I was contacted by the radiologist with report that patient's Foley catheter is within the prostatic urethra.  There is marked distention of the urinary bladder.  Patient's Foley catheter was replaced.  We will consult hospitalist team for admission.        Final Clinical Impression(s) / ED Diagnoses Final diagnoses:  Sepsis with acute renal failure without septic shock, due to unspecified organism, unspecified acute renal failure type [facility])    Rx / DC  Orders ED Discharge Orders     None         Dyann Ruddle 08/15/21 [id number]    Tegeler, Gwenyth Allegra, MD 08/15/21 2025

## 2021-08-14 NOTE — H&P (Signed)
History and Physical    MONTARIUS KITAGAWA FWY:[id number] DOB: 1930-08-14 DOA: 08/14/2021  PCP: Elby Showers, MD   Patient coming from: Home   Chief Complaint: Lower abdominal pain and difficulty urinating   HPI: Robert Bright is a pleasant 88 y.o. male with medical history significant for hypertension, hyperlipidemia, CKD 3B, prostate cancer status post prostatectomy in 1993, and history of urethral strictures who presents to the emergency department with suprapubic pain and difficulty voiding.  The patient reports that he saw his urologist for cystoscopy a little over a week ago and then had difficulty voiding a couple days later.  He was seen in the emergency department on 08/09/2021 where a Foley catheter was placed and he returned home with instruction to follow-up with his urologist.  He went on to develop suprapubic pain that continued to worsen and he noted that the catheter was not draining.  He denies any chest pain, cough, or shortness of breath.  [facility] ED Course: Upon arrival to the ED, patient is found to be febrile to 38.4 C, slightly tachycardic, and with stable blood pressure.  EKG features sinus rhythm with first-degree AV nodal block and RBBB.  Chest x-ray negative for acute findings.  CT of the abdomen and pelvis demonstrates marked bladder distention with Foley tip in the prostatic urethra.  Chemistry panel notable for creatinine 2.66.  CBC features a leukocytosis to 15,100 and mild thrombocytopenia.  Lactic acid was 2.2.  Blood and urine cultures were collected and the patient was given 2-1/2 L of LR, acetaminophen, and cefepime.  Foley catheter was replaced in the emergency department and the patient was transported to [facility] for admission.  Review of Systems:  All other systems reviewed and apart from HPI, are negative.  Past Medical History:  Diagnosis Date   Anxiety    Bladder cancer [facility]) 1993   Being followed by [name]   ED (erectile  dysfunction)    GERD (gastroesophageal reflux disease)    Heart murmur    Hypercholesterolemia    Hyperlipidemia    Hypertension    with a component of whitecoat syndrome   Prostate cancer ([place]) 1993   Hx ostatectomyof remote treated with radial pr   Shingles    Stress incontinence    Ureteral stricture    Vertigo     Past Surgical History:  Procedure Laterality Date   BLADDER TUMOR EXCISION     CARDIOVASCULAR STRESS TEST  01-02-2003   EF 68%   COLONOSCOPY     CYSTOSCOPY     CYSTOSCOPY W/ RETROGRADES Bilateral 04/24/2016   Procedure: CYSTOSCOPY WITH BILATERAL RETROGRADE PYELOGRAM;  Surgeon: Raynelle Bring, MD;  Location: [facility];  Service: Urology;  Laterality: Bilateral;   CYSTOSTOMY W/ BLADDER BIOPSY     EYE SURGERY     INGUINAL HERNIA REPAIR     LYMPHADENECTOMY     PROSTATECTOMY  1993   TRANSURETHRAL RESECTION OF BLADDER TUMOR N/A 04/24/2016   Procedure: TRANSURETHRAL RESECTION OF BLADDER TUMOR (TURBT);  Surgeon: Raynelle Bring, MD;  Location: [facility];  Service: Urology;  Laterality: N/A;    Social History:   reports that he quit smoking about 35 years ago. His smoking use included cigarettes. He has never used smokeless tobacco. He reports that he does not drink alcohol and does not use drugs.  Allergies  Allergen Reactions   Ramipril     cough    Family History  Problem Relation Age of Onset   Dementia Mother  Arthritis/Rheumatoid Father    Healthy Sister    Healthy Sister      Prior to Admission medications   Medication Sig Start Date End Date Taking? Authorizing Provider  amLODipine (NORVASC) 2.5 MG tablet Take 1 tablet (2.5 mg total) by mouth daily. 05/16/21   Nahser, Wonda Cheng, MD  atorvastatin (LIPITOR) 20 MG tablet Take 1 tablet (20 mg total) by mouth daily. 08/08/21   Nahser, Wonda Cheng, MD  furosemide (LASIX) 40 MG tablet Take 1 tablet (40 mg total) by mouth daily. 05/16/21   Nahser, Wonda Cheng, MD  LORazepam (ATIVAN) 0.5 MG tablet TAKE 1 TABLET BY MOUTH  EVERY DAY AS NEEDED FOR ANXIETY 02/23/21   Baxley, Cresenciano Lick, MD  NEOMYCIN-POLYMYXIN-HYDROCORTISONE (CORTISPORIN) 1 % SOLN OTIC solution Place 3 drops into the left ear 4 (four) times daily. 10/03/18   Elby Showers, MD  potassium chloride SA (KLOR-CON M) 20 MEQ tablet TAKE 1 TABLET(20 MEQ) BY MOUTH DAILY 06/02/21   Nahser, Wonda Cheng, MD    Physical Exam: Vitals:   08/14/21 1830 08/14/21 1845 08/14/21 1915 08/14/21 2109  BP: 110/70 115/74 137/71 (!) 148/80  Pulse: 74 76 69 72  Resp: 17 16 15 20   Temp:    98.2 F (36.8 C)  TempSrc:      SpO2: 94% 95% (!) 76% 99%    Constitutional: NAD, calm  Eyes: PERTLA, lids and conjunctivae normal ENMT: Mucous membranes are moist. Posterior pharynx clear of any exudate or lesions.   Neck: supple, no masses  Respiratory:  no wheezing, no crackles. No accessory muscle use.  Cardiovascular: S1 & S2 heard, regular rate and rhythm. No extremity edema.   Abdomen: No distension, no tenderness, soft. Bowel sounds active.  Musculoskeletal: no clubbing / cyanosis. No joint deformity upper and lower extremities.   Skin: no significant rashes, lesions, ulcers. Warm, dry, well-perfused. Neurologic: CN 2-12 grossly intact. Moving all extremities. Alert and oriented to person, place, and situation.  Psychiatric: Very pleasant. Cooperative.    Labs and Imaging on Admission: I have personally reviewed following labs and imaging studies  CBC: Recent Labs  Lab 08/09/21 2217 08/14/21 1515  WBC 9.6 15.1*  NEUTROABS  --  14.3*  HGB 12.6* 11.9*  HCT 37.8* 36.3*  MCV 90.2 91.7  PLT 177 638*   Basic Metabolic Panel: Recent Labs  Lab 08/09/21 2217 08/14/21 1515  NA 133* 137  K 3.5 3.4*  CL 101 104  CO2 21* 19*  GLUCOSE 90 92  BUN 32* 41*  CREATININE 2.11* 2.66*  CALCIUM 8.9 9.1   GFR: Estimated Creatinine Clearance: 19.8 mL/min (A) (by C-G formula based on SCr of 2.66 mg/dL (H)). Liver Function Tests: Recent Labs  Lab 08/14/21 1515  AST 61*  ALT 36   ALKPHOS 101  BILITOT 0.8  PROT 6.6  ALBUMIN 3.5   No results for input(s): LIPASE, AMYLASE in the last 168 hours. No results for input(s): AMMONIA in the last 168 hours. Coagulation Profile: Recent Labs  Lab 08/14/21 1515  INR 1.1   Cardiac Enzymes: No results for input(s): CKTOTAL, CKMB, CKMBINDEX, TROPONINI in the last 168 hours. BNP (last 3 results) No results for input(s): PROBNP in the last 8760 hours. HbA1C: No results for input(s): HGBA1C in the last 72 hours. CBG: No results for input(s): GLUCAP in the last 168 hours. Lipid Profile: No results for input(s): CHOL, HDL, LDLCALC, TRIG, CHOLHDL, LDLDIRECT in the last 72 hours. Thyroid Function Tests: No results for input(s): TSH, T4TOTAL, FREET4,  T3FREE, THYROIDAB in the last 72 hours. Anemia Panel: No results for input(s): VITAMINB12, FOLATE, FERRITIN, TIBC, IRON, RETICCTPCT in the last 72 hours. Urine analysis:    Component Value Date/Time   COLORURINE YELLOW 08/14/2021 1515   APPEARANCEUR HAZY (A) 08/14/2021 1515   LABSPEC 1.015 08/14/2021 1515   PHURINE 7.5 08/14/2021 1515   GLUCOSEU NEGATIVE 08/14/2021 1515   HGBUR LARGE (A) 08/14/2021 1515   BILIRUBINUR NEGATIVE 08/14/2021 1515   BILIRUBINUR NEG 04/02/2020 1514   KETONESUR NEGATIVE 08/14/2021 1515   PROTEINUR 100 (A) 08/14/2021 1515   UROBILINOGEN 0.2 04/02/2020 1514   UROBILINOGEN 0.2 09/10/2011 0228   NITRITE NEGATIVE 08/14/2021 1515   LEUKOCYTESUR LARGE (A) 08/14/2021 1515   Sepsis Labs: @LABRCNTIP (procalcitonin:4,lacticidven:4) ) Recent Results (from the past 240 hour(s))  Resp Panel by RT-PCR (Flu A&B, Covid)     Status: None   Collection Time: 08/14/21  3:15 PM   Specimen: Nasopharyngeal(NP) swabs in vial transport medium  Result Value Ref Range Status   SARS Coronavirus 2 by RT PCR NEGATIVE NEGATIVE Final    Comment: (NOTE) SARS-CoV-2 target nucleic acids are NOT DETECTED.  The SARS-CoV-2 RNA is generally detectable in upper  respiratory specimens during the acute phase of infection. The lowest concentration of SARS-CoV-2 viral copies this assay can detect is 138 copies/mL. A negative result does not preclude SARS-Cov-2 infection and should not be used as the sole basis for treatment or other patient management decisions. A negative result may occur with  improper specimen collection/handling, submission of specimen other than nasopharyngeal swab, presence of viral mutation(s) within the areas targeted by this assay, and inadequate number of viral copies(<138 copies/mL). A negative result must be combined with clinical observations, patient history, and epidemiological information. The expected result is Negative.  Fact Sheet for Patients:  EntrepreneurPulse.com.au  Fact Sheet for Healthcare Providers:  IncredibleEmployment.be  This test is no t yet approved or cleared by the [place] FDA and  has been authorized for detection and/or diagnosis of SARS-CoV-2 by FDA under an Emergency Use Authorization (EUA). This EUA will remain  in effect (meaning this test can be used) for the duration of the COVID-19 declaration under Section 564(b)(1) of the Act, 21 U.S.C.section 360bbb-3(b)(1), unless the authorization is terminated  or revoked sooner.       Influenza A by PCR NEGATIVE NEGATIVE Final   Influenza B by PCR NEGATIVE NEGATIVE Final    Comment: (NOTE) The Xpert Xpress SARS-CoV-2/FLU/RSV plus assay is intended as an aid in the diagnosis of influenza from Nasopharyngeal swab specimens and should not be used as a sole basis for treatment. Nasal washings and aspirates are unacceptable for Xpert Xpress SARS-CoV-2/FLU/RSV testing.  Fact Sheet for Patients: EntrepreneurPulse.com.au  Fact Sheet for Healthcare Providers: IncredibleEmployment.be  This test is not yet approved or cleared by the [place] FDA and has been  authorized for detection and/or diagnosis of SARS-CoV-2 by FDA under an Emergency Use Authorization (EUA). This EUA will remain in effect (meaning this test can be used) for the duration of the COVID-19 declaration under Section 564(b)(1) of the Act, 21 U.S.C. section 360bbb-3(b)(1), unless the authorization is terminated or revoked.  Performed at [facility], [address], [place], [place] [address]      Radiological Exams on Admission: CT ABDOMEN PELVIS WO CONTRAST  Result Date: 08/14/2021 CLINICAL DATA:  Suprapubic tenderness.  Dysuria for 2 days. EXAM: CT ABDOMEN AND PELVIS WITHOUT CONTRAST TECHNIQUE: Multidetector CT imaging of the abdomen and pelvis was performed  following the standard protocol without IV contrast. RADIATION DOSE REDUCTION: This exam was performed according to the departmental dose-optimization program which includes automated exposure control, adjustment of the mA and/or kV according to patient size and/or use of iterative reconstruction technique. COMPARISON:  05/23/2006 FINDINGS: Lower chest: Minimal subsegmental atelectasis at the lung bases. Coronary artery calcifications. Hepatobiliary: The gallbladder is present.  No focal liver lesion. Pancreas: Unremarkable. No pancreatic ductal dilatation or surrounding inflammatory changes. Spleen: Normal in size without focal abnormality. Adrenals/Urinary Tract: Adrenal glands are normal. RIGHT kidney: There are numerous cysts throughout the RIGHT kidney, largest proximally 6.9 centimeters. Renal parenchymal thinning is present. There is RIGHT ureteropelvic junction obstruction. RIGHT ureter is normal. LEFT kidney: Numerous cysts are present. No hydronephrosis. LEFT ureter is unremarkable. Urinary bladder is distended. The Foley balloon is within the proximal penile urethra. Numerous clips are identified LATERAL and posterior to the urinary bladder. Stomach/Bowel: Stomach is normal. Small duodenal diverticulum.  Small bowel loops are normal in appearance. There is extensive colonic diverticulosis. No acute diverticulitis. Appendix is not well seen. No inflammatory changes in the RIGHT LOWER QUADRANT. Vascular/Lymphatic: There is dense atherosclerotic calcification of the abdominal aorta. No associated aneurysm. Although involved by atherosclerosis, there is vascular opacification of the celiac axis, superior mesenteric artery, and inferior mesenteric artery. Normal appearance of the portal venous system and inferior vena cava. No retroperitoneal or mesenteric adenopathy. Reproductive: Prior prostatectomy. Other: Small fat containing paraumbilical hernia.  No ascites. Musculoskeletal: Moderate degenerative changes and scoliosis of the thoracolumbar spine. No acute abnormality. IMPRESSION: 1. Marked distension of the urinary bladder. The Foley catheter tip is within the prostatic urethra, balloon inflated in the posterior penile urethra. Recommend repositioning of Foley catheter after deflating the balloon. 2. Postoperative changes in the pelvis surrounding the urinary bladder. 3. Polycystic kidneys. RIGHT ureteropelvic junction obstruction and RIGHT renal parenchymal thinning. 4. Extensive colonic diverticulosis without acute diverticulitis. 5.  Aortic atherosclerosis.  (ICD10-I70.0) 6. Prostatectomy. These results were called by telephone at the time of interpretation on 08/14/2021 at 6:06 pm to provider [facility] , who verbally acknowledged these results. Electronically Signed   By: Nolon Nations M.D.   On: 08/14/2021 18:06   DG Chest Port 1 View  Result Date: 08/14/2021 CLINICAL DATA:  Incontinence Questionable sepsis - evaluate for abnormality EXAM: PORTABLE CHEST 1 VIEW COMPARISON:  Radiograph 08/17/2010 FINDINGS: Normal cardiac silhouette with ectatic aorta. Rounded RIGHT hilar density superimposed on the ascending thoracic aorta shadow measuring 2 cm. Atherosclerotic calcification of the aorta. No acute  osseous abnormality. IMPRESSION: 1. No acute cardiopulmonary findings. 2. Rounded density at the RIGHT hilum may represent vascular shadow. Cannot exclude pulmonary nodule. Consider CT thorax without contrast Electronically Signed   By: Suzy Bouchard M.D.   On: 08/14/2021 14:39    EKG: Independently reviewed. Sinus rhythm, 1st degree AV block.   Assessment/Plan   1. Sepsis secondary to UTI  - Presents with suprapubic pain and found to have fever, leukocytosis, and malpositioned Foley with bladder distension  - Foley was replaced in ED and draining; blood and urine cultures were collected, 30 cc/kg LR bolus administered, and cefepime started  - Continue cefepime, follow cultures and clinical course     2. AKI superimposed on CKD IIIb  - SCr is 2.66 on admission, up from 2.11 a few days earlier  - Foley was not draining and causing obstruction, now replaced and draining  - Hold Lasix for now, renally-dose medications, repeat chem panel in am  3. Hypertension  - Continue Norvasc as tolerated   4. Urinary retention  - Continue Foley, continue urology follow-up as planned    DVT prophylaxis: sq heparin  Code Status: DNR, discussed with patient on admission  Level of Care: Level of care: Telemetry Family Communication: none present  Disposition Plan:  Patient is from: Home  Anticipated d/c is to: TBD Anticipated d/c date is: 08/16/21 Patient currently: Pending resolution of sepsis, conversion to oral antibiotic Consults called: none  Admission status: Inpatient     Vianne Bulls, MD [facility]  08/14/2021, 10:17 PM

## 2021-08-14 NOTE — Progress Notes (Signed)
Elink following code sepsis °

## 2021-08-15 DIAGNOSIS — R339 Retention of urine, unspecified: Secondary | ICD-10-CM | POA: Diagnosis not present

## 2021-08-15 DIAGNOSIS — A419 Sepsis, unspecified organism: Secondary | ICD-10-CM | POA: Diagnosis not present

## 2021-08-15 DIAGNOSIS — N390 Urinary tract infection, site not specified: Secondary | ICD-10-CM | POA: Diagnosis not present

## 2021-08-15 DIAGNOSIS — I10 Essential (primary) hypertension: Secondary | ICD-10-CM | POA: Diagnosis not present

## 2021-08-15 LAB — BLOOD CULTURE ID PANEL (REFLEXED) - BCID2

## 2021-08-15 LAB — CBC
HCT: 32 % — ABNORMAL LOW (ref 39.0–52.0)
Hemoglobin: 10.6 g/dL — ABNORMAL LOW (ref 13.0–17.0)
MCH: 31 pg (ref 26.0–34.0)
MCHC: 33.1 g/dL (ref 30.0–36.0)
MCV: 93.6 fL (ref 80.0–100.0)
Platelets: 120 10*3/uL — ABNORMAL LOW (ref 150–400)
RBC: 3.42 MIL/uL — ABNORMAL LOW (ref 4.22–5.81)
RDW: 14.2 % (ref 11.5–15.5)
WBC: 21.2 10*3/uL — ABNORMAL HIGH (ref 4.0–10.5)
nRBC: 0 % (ref 0.0–0.2)

## 2021-08-15 LAB — BASIC METABOLIC PANEL
Anion gap: 8 (ref 5–15)
BUN: 38 mg/dL — ABNORMAL HIGH (ref 8–23)
CO2: 21 mmol/L — ABNORMAL LOW (ref 22–32)
Calcium: 8.3 mg/dL — ABNORMAL LOW (ref 8.9–10.3)
Chloride: 106 mmol/L (ref 98–111)
Creatinine, Ser: 2.37 mg/dL — ABNORMAL HIGH (ref 0.61–1.24)
GFR, Estimated: 26 mL/min — ABNORMAL LOW (ref >60)
Glucose, Bld: 108 mg/dL — ABNORMAL HIGH (ref 70–99)
Potassium: 3.7 mmol/L (ref 3.5–5.1)
Sodium: 135 mmol/L (ref 135–145)

## 2021-08-15 MED ORDER — CHLORHEXIDINE GLUCONATE CLOTH 2 % EX PADS
6.0000 | MEDICATED_PAD | Freq: Every day | CUTANEOUS | Status: DC
  Administered 2021-08-15 – 2021-08-20 (×4): 6 via TOPICAL

## 2021-08-15 MED ORDER — CEFAZOLIN SODIUM-DEXTROSE 2-4 GM/100ML-% IV SOLN
2.0000 g | Freq: Two times a day (BID) | INTRAVENOUS | Status: DC
  Administered 2021-08-15 – 2021-08-20 (×10): 2 g via INTRAVENOUS
  Filled 2021-08-15 (×12): qty 100

## 2021-08-15 NOTE — Plan of Care (Signed)
°  Problem: Education: Goal: Knowledge of General Education information will improve Description: Including pain rating scale, medication(s)/side effects and non-pharmacologic comfort measures Outcome: Progressing   Problem: Coping: Goal: Level of anxiety will decrease Outcome: Progressing   Problem: Elimination: Goal: Will not experience complications related to bowel motility Outcome: Progressing Goal: Will not experience complications related to urinary retention Outcome: Progressing   Problem: Pain Managment: Goal: General experience of comfort will improve Outcome: Progressing   Problem: Safety: Goal: Ability to remain free from injury will improve Outcome: Progressing   Problem: Skin Integrity: Goal: Risk for impaired skin integrity will decrease Outcome: Progressing   Problem: Respiratory: Goal: Ability to maintain adequate ventilation will improve Outcome: Progressing

## 2021-08-15 NOTE — Progress Notes (Signed)
Notified on call provider about patient having bleeding coming from penis around the catheter, which there was not a lot. Patient did have stat lock to keep foley in place to where it was pulling and tugging on foley. Replaced stat lock to prevent foley from pulling and tugging to prevent any irritation.

## 2021-08-15 NOTE — Progress Notes (Signed)
Junior Rimando RN to assume care for this P. Report given

## 2021-08-15 NOTE — Consult Note (Signed)
Urology Consult Note   Requesting Attending Physician:  Donne Hazel, MD Service Providing Consult: Urology  Consulting Attending: Dr. Irine Seal   Reason for Consult:  Traumatic Foley catheter placement, urethral bleeding  HPI: Robert Bright is seen in consultation for reasons noted above at the request of Donne Hazel, MD for evaluation of traumatic catheter placement with urethral bleeding.  This is a 88 y.o. male with Hx of HTN, HLD, CKD 3B, prostate cancer s/p prostatectomy (1993) c/b urethral strictures who presented to the ED with suprapubic pain, fever, and poorly draining catheter.  He had a cystoscopy on 08/05/21 with [name] with fulguration of a small bladder polyp and subsequently went into urinary retention on 08/09/21 where a Foley catheter was placed. UA at that time was not concerning for infection. Over the subsequent days he proceeded to have suprapubic pain and found that his catheter was not draining well.  He presented to the ED where he was found to be febrile, UA concerning for infection, and CT demonstrating that his catheter was malpositioned in his prostatic/bulbar urethra with a distended bladder. This catheter was removed and replaced and has been draining effectively since that time. He was started on antibiotics and cultures were collected. Bcx's w/ GPC's. Ucx is currently pending.   He has had some mild bleeding around the catheter, but this is currently hemostatic.    Past Medical History: Past Medical History:  Diagnosis Date   Anxiety    Bladder cancer ([place]) 1993   Being followed by [name]   ED (erectile dysfunction)    GERD (gastroesophageal reflux disease)    Heart murmur    Hypercholesterolemia    Hyperlipidemia    Hypertension    with a component of whitecoat syndrome   Prostate cancer ([place]) 1993   Hx ostatectomyof remote treated with radial pr   Shingles    Stress incontinence    Ureteral stricture    Vertigo     Past  Surgical History:  Past Surgical History:  Procedure Laterality Date   BLADDER TUMOR EXCISION     CARDIOVASCULAR STRESS TEST  01-02-2003   EF 68%   COLONOSCOPY     CYSTOSCOPY     CYSTOSCOPY W/ RETROGRADES Bilateral 04/24/2016   Procedure: CYSTOSCOPY WITH BILATERAL RETROGRADE PYELOGRAM;  Surgeon: Raynelle Bring, MD;  Location: [facility];  Service: Urology;  Laterality: Bilateral;   CYSTOSTOMY W/ BLADDER BIOPSY     EYE SURGERY     INGUINAL HERNIA REPAIR     LYMPHADENECTOMY     PROSTATECTOMY  1993   TRANSURETHRAL RESECTION OF BLADDER TUMOR N/A 04/24/2016   Procedure: TRANSURETHRAL RESECTION OF BLADDER TUMOR (TURBT);  Surgeon: Raynelle Bring, MD;  Location: [facility];  Service: Urology;  Laterality: N/A;    Medication: Current Facility-Administered Medications  Medication Dose Route Frequency Provider Last Rate Last Admin   acetaminophen (TYLENOL) tablet 650 mg  650 mg Oral Q6H PRN Opyd, Ilene Qua, MD   650 mg at 08/15/21 0545   Or   acetaminophen (TYLENOL) suppository 650 mg  650 mg Rectal Q6H PRN Opyd, Ilene Qua, MD       amLODipine (NORVASC) tablet 2.5 mg  2.5 mg Oral Daily Opyd, Ilene Qua, MD   2.5 mg at 08/15/21 1030   atorvastatin (LIPITOR) tablet 20 mg  20 mg Oral Daily Opyd, Ilene Qua, MD   20 mg at 08/15/21 1030   ceFAZolin (ANCEF) IVPB 2g/100 mL premix  2 g Intravenous Q12H  Susa Raring, [facility]       Chlorhexidine Gluconate Cloth 2 % PADS 6 each  6 each Topical Daily Opyd, Ilene Qua, MD   6 each at 08/15/21 1031   LORazepam (ATIVAN) tablet 0.5 mg  0.5 mg Oral Daily PRN Opyd, Ilene Qua, MD       senna-docusate (Senokot-S) tablet 1 tablet  1 tablet Oral QHS PRN Opyd, Ilene Qua, MD        Allergies: Allergies  Allergen Reactions   Ramipril     cough    Social History: Social History   Tobacco Use   Smoking status: Former    Types: Cigarettes    Quit date: 01/16/1986    Years since quitting: 35.6   Smokeless tobacco: Never  Vaping Use   Vaping Use: Never used  Substance Use  Topics   Alcohol use: No   Drug use: No    Family History Family History  Problem Relation Age of Onset   Dementia Mother    Arthritis/Rheumatoid Father    Healthy Sister    Healthy Sister     Review of Systems 10 systems were reviewed and are negative except as noted specifically in the HPI.  Objective   Vital signs in last 24 hours: BP 118/65 (BP Location: Right Arm)    Pulse (!) 54    Temp (!) 97.4 F (36.3 C)    Resp 16    Ht 5\' 10"  (1.778 m)    Wt 79.4 kg    SpO2 99%    BMI 25.12 kg/m   Physical Exam General: NAD, A&O, resting, appropriate HEENT: [place]/AT, EOMI, MMM Pulmonary: Normal work of breathing Cardiovascular: HDS, adequate peripheral perfusion Abdomen: Soft, NTTP, nondistended. GU: Foley catheter in place draining CYU, mild blood around the meatus without active bleeding Extremities: warm and well perfused Neuro: Appropriate, no focal neurological deficits  Most Recent Labs: Lab Results  Component Value Date   WBC 21.2 (H) 08/15/2021   HGB 10.6 (L) 08/15/2021   HCT 32.0 (L) 08/15/2021   PLT 120 (L) 08/15/2021    Lab Results  Component Value Date   NA 135 08/15/2021   K 3.7 08/15/2021   CL 106 08/15/2021   CO2 21 (L) 08/15/2021   BUN 38 (H) 08/15/2021   CREATININE 2.37 (H) 08/15/2021   CALCIUM 8.3 (L) 08/15/2021   MG 2.3 10/17/2017    Lab Results  Component Value Date   INR 1.1 08/14/2021   APTT 25 08/14/2021    IMAGING: CT ABDOMEN PELVIS WO CONTRAST  Result Date: 08/14/2021 CLINICAL DATA:  Suprapubic tenderness.  Dysuria for 2 days. EXAM: CT ABDOMEN AND PELVIS WITHOUT CONTRAST TECHNIQUE: Multidetector CT imaging of the abdomen and pelvis was performed following the standard protocol without IV contrast. RADIATION DOSE REDUCTION: This exam was performed according to the departmental dose-optimization program which includes automated exposure control, adjustment of the mA and/or kV according to patient size and/or use of iterative reconstruction  technique. COMPARISON:  05/23/2006 FINDINGS: Lower chest: Minimal subsegmental atelectasis at the lung bases. Coronary artery calcifications. Hepatobiliary: The gallbladder is present.  No focal liver lesion. Pancreas: Unremarkable. No pancreatic ductal dilatation or surrounding inflammatory changes. Spleen: Normal in size without focal abnormality. Adrenals/Urinary Tract: Adrenal glands are normal. RIGHT kidney: There are numerous cysts throughout the RIGHT kidney, largest proximally 6.9 centimeters. Renal parenchymal thinning is present. There is RIGHT ureteropelvic junction obstruction. RIGHT ureter is normal. LEFT kidney: Numerous cysts are present. No hydronephrosis. LEFT ureter is  unremarkable. Urinary bladder is distended. The Foley balloon is within the proximal penile urethra. Numerous clips are identified LATERAL and posterior to the urinary bladder. Stomach/Bowel: Stomach is normal. Small duodenal diverticulum. Small bowel loops are normal in appearance. There is extensive colonic diverticulosis. No acute diverticulitis. Appendix is not well seen. No inflammatory changes in the RIGHT LOWER QUADRANT. Vascular/Lymphatic: There is dense atherosclerotic calcification of the abdominal aorta. No associated aneurysm. Although involved by atherosclerosis, there is vascular opacification of the celiac axis, superior mesenteric artery, and inferior mesenteric artery. Normal appearance of the portal venous system and inferior vena cava. No retroperitoneal or mesenteric adenopathy. Reproductive: Prior prostatectomy. Other: Small fat containing paraumbilical hernia.  No ascites. Musculoskeletal: Moderate degenerative changes and scoliosis of the thoracolumbar spine. No acute abnormality. IMPRESSION: 1. Marked distension of the urinary bladder. The Foley catheter tip is within the prostatic urethra, balloon inflated in the posterior penile urethra. Recommend repositioning of Foley catheter after deflating the balloon.  2. Postoperative changes in the pelvis surrounding the urinary bladder. 3. Polycystic kidneys. RIGHT ureteropelvic junction obstruction and RIGHT renal parenchymal thinning. 4. Extensive colonic diverticulosis without acute diverticulitis. 5.  Aortic atherosclerosis.  (ICD10-I70.0) 6. Prostatectomy. These results were called by telephone at the time of interpretation on 08/14/2021 at 6:06 pm to provider [facility] , who verbally acknowledged these results. Electronically Signed   By: Nolon Nations M.D.   On: 08/14/2021 18:06   DG Chest Port 1 View  Result Date: 08/14/2021 CLINICAL DATA:  Incontinence Questionable sepsis - evaluate for abnormality EXAM: PORTABLE CHEST 1 VIEW COMPARISON:  Radiograph 08/17/2010 FINDINGS: Normal cardiac silhouette with ectatic aorta. Rounded RIGHT hilar density superimposed on the ascending thoracic aorta shadow measuring 2 cm. Atherosclerotic calcification of the aorta. No acute osseous abnormality. IMPRESSION: 1. No acute cardiopulmonary findings. 2. Rounded density at the RIGHT hilum may represent vascular shadow. Cannot exclude pulmonary nodule. Consider CT thorax without contrast Electronically Signed   By: Suzy Bouchard M.D.   On: 08/14/2021 14:39    ------  Assessment:  88 y.o. male with Hx of prostate cancer s/p prostatectomy (1993) w/ urinary retention following cystoscopy c/b traumatic foley placement and UTI. Agree with broad spectrum antibiotics and cultures for possible UTI. Agree with catheter exchange and bladder drainage for the time being.   Recommend continuing his catheter until he is closer to discharge and then a TOV can be attempted at that time.    Recommendations: - Agree with broad spectrum antibiotics. Follow up cultures and tailor antibiotics as appropriate.  - Continue current indwelling Foley catheter for urethral rest given traumatic placement. Can remove and TOV when closer to discharge and ideally once on culture specific  antibiotics.  - No intervention needed for peri-catheter bleeding. Likely related to malpositioned catheter and should resolve on it's own.    Thank you for this consult. Please contact the urology consult pager with any further questions/concerns.  Reola Mosher, MD [facility] Resident, [facility]

## 2021-08-15 NOTE — Progress Notes (Signed)
PHARMACY - PHYSICIAN COMMUNICATION CRITICAL VALUE ALERT - BLOOD CULTURE IDENTIFICATION (BCID)  Robert Bright is an 88 y.o. male who presented to [facility] on 08/14/2021 with a chief complaint of lower abdominal pain and difficulty urinating.   Assessment: 88 year old male admitted with a malpositioned catheter that was not draining. Foley now replaced and draining but noted some bleeding around foley site per RN. Now also with MSSA in 1/2 blood cultures.   Name of physician (or Provider) Contacted: Chiu/Singh (ID team)   Current antibiotics: Cefepime   Changes to prescribed antibiotics recommended:  Narrow to Ancef 2 gm IV Q 12 hours    Results for orders placed or performed during the hospital encounter of 08/14/21  Blood Culture ID Panel (Reflexed) (Collected: 08/14/2021  3:15 PM)  Result Value Ref Range   Enterococcus faecalis NOT DETECTED NOT DETECTED   Enterococcus Faecium NOT DETECTED NOT DETECTED   Listeria monocytogenes NOT DETECTED NOT DETECTED   Staphylococcus species DETECTED (A) NOT DETECTED   Staphylococcus aureus (BCID) DETECTED (A) NOT DETECTED   Staphylococcus epidermidis NOT DETECTED NOT DETECTED   Staphylococcus lugdunensis NOT DETECTED NOT DETECTED   Streptococcus species NOT DETECTED NOT DETECTED   Streptococcus agalactiae NOT DETECTED NOT DETECTED   Streptococcus pneumoniae NOT DETECTED NOT DETECTED   Streptococcus pyogenes NOT DETECTED NOT DETECTED   A.calcoaceticus-baumannii NOT DETECTED NOT DETECTED   Bacteroides fragilis NOT DETECTED NOT DETECTED   Enterobacterales NOT DETECTED NOT DETECTED   Enterobacter cloacae complex NOT DETECTED NOT DETECTED   Escherichia coli NOT DETECTED NOT DETECTED   Klebsiella aerogenes NOT DETECTED NOT DETECTED   Klebsiella oxytoca NOT DETECTED NOT DETECTED   Klebsiella pneumoniae NOT DETECTED NOT DETECTED   Proteus species NOT DETECTED NOT DETECTED   Salmonella species NOT DETECTED NOT DETECTED   Serratia marcescens NOT  DETECTED NOT DETECTED   Haemophilus influenzae NOT DETECTED NOT DETECTED   Neisseria meningitidis NOT DETECTED NOT DETECTED   Pseudomonas aeruginosa NOT DETECTED NOT DETECTED   Stenotrophomonas maltophilia NOT DETECTED NOT DETECTED   Candida albicans NOT DETECTED NOT DETECTED   Candida auris NOT DETECTED NOT DETECTED   Candida glabrata NOT DETECTED NOT DETECTED   Candida krusei NOT DETECTED NOT DETECTED   Candida parapsilosis NOT DETECTED NOT DETECTED   Candida tropicalis NOT DETECTED NOT DETECTED   Cryptococcus neoformans/gattii NOT DETECTED NOT DETECTED   Meth resistant mecA/C and MREJ NOT DETECTED NOT DETECTED    Jimmy Footman, PharmD, BCPS, BCIDP Infectious Diseases Clinical Pharmacist Phone: [phone] 08/15/2021  1:22 PM

## 2021-08-15 NOTE — Progress Notes (Signed)
Md updated via phone. Pt this shift has been oozing blood around foley catheter however when Pt stood to get back from the chair to the bed a large area of bright red blood noted on the pad. New orders received

## 2021-08-15 NOTE — Plan of Care (Signed)
  Problem: Education: Goal: Knowledge of General Education information will improve Description: Including pain rating scale, medication(s)/side effects and non-pharmacologic comfort measures Outcome: Progressing   Problem: Health Behavior/Discharge Planning: Goal: Ability to manage health-related needs will improve Outcome: Progressing   Problem: Clinical Measurements: Goal: Will remain free from infection Outcome: Progressing   

## 2021-08-15 NOTE — Progress Notes (Signed)
Progress Note   Patient: Robert Bright IOE:[id number] DOB: September 05, 1930 DOA: 08/14/2021     1 DOS: the patient was seen and examined on 08/15/2021   Brief hospital course: 88 y.o. male with medical history significant for hypertension, hyperlipidemia, CKD 3B, prostate cancer status post prostatectomy in 1993, and history of urethral strictures who presents to the emergency department with suprapubic pain and difficulty voiding.  The patient reports that he saw his urologist for cystoscopy a little over a week ago and then had difficulty voiding a couple days later.  He was seen in the emergency department on 08/09/2021 where a Foley catheter was placed and he returned home with instruction to follow-up with his urologist.  He went on to develop suprapubic pain that continued to worsen and he noted that the catheter was not draining.  He denies any chest pain, cough, or shortness of breath.  In the ED, patient is found to be febrile to 38.4 C, slightly tachycardic, and with stable blood pressure.  EKG features sinus rhythm with first-degree AV nodal block and RBBB.  Chest x-ray negative for acute findings.  CT of the abdomen and pelvis demonstrates marked bladder distention with Foley tip in the prostatic urethra.  Chemistry panel notable for creatinine 2.66.  CBC features a leukocytosis to 15,100 and mild thrombocytopenia.  Lactic acid was 2.2.  Blood and urine cultures were collected and the patient was given 2-1/2 L of LR, acetaminophen, and cefepime.  Foley catheter was replaced in the emergency department and the patient was transported to [facility] for admission.  Assessment and Plan: No notes have been filed under this hospital service. Service: Hospitalist  1. Sepsis secondary to UTI  - Presents with suprapubic pain and found to have fever, leukocytosis, and malpositioned Foley with bladder distension  - Foley was replaced in ED and draining; blood and urine cultures were collected,  30 cc/kg LR bolus administered, and cefepime started  - Urine cx pending -Blood cx pos for MSSA. Now on cefazolin   2. AKI superimposed on CKD IIIb  - SCr is 2.66 on admission, up from 2.11 a few days earlier  - Foley was not draining and causing obstruction, foley was replaced in ED now with good urine output this AM - Cr down to 2.37 -Discussed with Urology. Recs to d/c foley at this time   3. Hypertension  - Continue Norvasc as tolerated    4. Urinary retention  - Foley initially replaced in ED -Good urine outpt via cath. Per Urology, trial of d/c cath   5. Hematuria - Bleeding noted around foley cath this AM -Hgb overall stable -Staff reports bleeding seems increased -Discussed with Urology who will see pt in consultation. For now, will give trial of d/c foley cath per Urology recs      Subjective: Complaining of bleeding around catheter  Physical Exam: Vitals:   08/15/21 0102 08/15/21 0540 08/15/21 1030 08/15/21 1249  BP: 101/60 124/67 125/62 118/65  Pulse: (!) 54 61  (!) 54  Resp: 20 20  16   Temp: 97.8 F (36.6 C) 97.8 F (36.6 C)  (!) 97.4 F (36.3 C)  TempSrc: Oral     SpO2: 97% 97%  99%  Weight: 79.4 kg     Height: 5\' 10"  (1.778 m)      General exam: Awake, laying in bed, in nad Respiratory system: Normal respiratory effort, no wheezing Cardiovascular system: regular rate, s1, s2 Gastrointestinal system: Soft, nondistended, positive BS Central nervous  system: CN2-12 grossly intact, strength intact Extremities: Perfused, no clubbing Skin: Normal skin turgor, no notable skin lesions seen Psychiatry: Mood normal // no visual hallucinations   Data Reviewed:  Labs reviewed, hgb 10.6  Family Communication: Pt in room, family not at bedside  Disposition: Status is: Inpatient Remains inpatient appropriate because: Severity of illness    Planned Discharge Destination: Home      Author: Marylu Lund, MD 08/15/2021 3:40 PM  For on call review  [email or web address].

## 2021-08-16 ENCOUNTER — Inpatient Hospital Stay (HOSPITAL_COMMUNITY): Payer: Medicare Other

## 2021-08-16 DIAGNOSIS — B9561 Methicillin susceptible Staphylococcus aureus infection as the cause of diseases classified elsewhere: Secondary | ICD-10-CM | POA: Diagnosis not present

## 2021-08-16 DIAGNOSIS — R6520 Severe sepsis without septic shock: Secondary | ICD-10-CM

## 2021-08-16 DIAGNOSIS — N390 Urinary tract infection, site not specified: Secondary | ICD-10-CM

## 2021-08-16 DIAGNOSIS — A419 Sepsis, unspecified organism: Secondary | ICD-10-CM | POA: Diagnosis not present

## 2021-08-16 DIAGNOSIS — R7881 Bacteremia: Secondary | ICD-10-CM | POA: Diagnosis not present

## 2021-08-16 LAB — BASIC METABOLIC PANEL
Anion gap: 7 (ref 5–15)
BUN: 34 mg/dL — ABNORMAL HIGH (ref 8–23)
CO2: 23 mmol/L (ref 22–32)
Calcium: 8.2 mg/dL — ABNORMAL LOW (ref 8.9–10.3)
Chloride: 106 mmol/L (ref 98–111)
Creatinine, Ser: 2.11 mg/dL — ABNORMAL HIGH (ref 0.61–1.24)
GFR, Estimated: 30 mL/min — ABNORMAL LOW (ref >60)
Glucose, Bld: 94 mg/dL (ref 70–99)
Potassium: 3.6 mmol/L (ref 3.5–5.1)
Sodium: 136 mmol/L (ref 135–145)

## 2021-08-16 LAB — ECHOCARDIOGRAM COMPLETE
AR max vel: 2.28 cm2
AV Area VTI: 2.1 cm2
AV Area mean vel: 2.18 cm2
AV Mean grad: 7 mmHg
AV Peak grad: 12.7 mmHg
Ao pk vel: 1.78 m/s
Area-P 1/2: 3.48 cm2
Height: 70 in
P 1/2 time: 544 msec
S' Lateral: 2.8 cm
Weight: 2800.72 oz

## 2021-08-16 LAB — CBC
HCT: 34.3 % — ABNORMAL LOW (ref 39.0–52.0)
Hemoglobin: 11.3 g/dL — ABNORMAL LOW (ref 13.0–17.0)
MCH: 30.5 pg (ref 26.0–34.0)
MCHC: 32.9 g/dL (ref 30.0–36.0)
MCV: 92.7 fL (ref 80.0–100.0)
Platelets: 126 10*3/uL — ABNORMAL LOW (ref 150–400)
RBC: 3.7 MIL/uL — ABNORMAL LOW (ref 4.22–5.81)
RDW: 14 % (ref 11.5–15.5)
WBC: 15.3 10*3/uL — ABNORMAL HIGH (ref 4.0–10.5)
nRBC: 0 % (ref 0.0–0.2)

## 2021-08-16 NOTE — Progress Notes (Signed)
[place] for Infectious Disease  Date of Admission:  08/14/2021   Total days of inpatient antibiotics 3  Principal Problem:   Sepsis secondary to UTI [facility]) Active Problems:   Hypertension   Acute renal failure superimposed on stage 3b chronic kidney disease (HCC)   Urinary retention          Assessment: 88 year old male with a history of prostate cancer SP prostatectomy in 1993 followed by urology.  Underwent urological procedure on 2/17.  Came to the ED on 2/21 for  abdominal pain and Foley catheter placed due to urinary retention. Pain worsened and pt re-presented to the  ED and admitted for sepsis secondary to UTI.  Found to have MSSA bacteremia ID consulted.   # MSSA bacteremia likely 2/2 recent urologic intervention(cycloscopy with fulguration) -CT showed distention of urinary bladder and foley catheter tip in prostatic urethra. Right ureteropelvic junction obstruction. Urology following per malpositioned foley. -Urine Cx+ staph aureus -Pt underwent cystoscopy on 2/17 with Dr. Borden(Urology) with fulguration of small bladder polyp.  Reports has had worsening abdominal pain since that time. I think this is the most likely source of bacteremia as Urine cx+ staph aureus.  he has no recent hospitalization, no lines placed prior to hospitalization, no hardware in place. -TTE did not show valvular vegetation.   Recommendations:  - Continue cefazolin - Follow-up repeat blood Cx - Anticipate about 4 weeks of antibiotics as pt had been bacteremic following the fulguration. Likely represent uncomplicated MRSA bacteremia but given altered anatomy would like to treat with a longer course.   Microbiology:   Antibiotics: Cefepime   Cultures: Blood 2/26 1/2 MSSA 2/28 pending SUBJECTIVE: No new complaints, no significant overnight events.  Interval: Afebrile overnight. Wbc 15.3k Review of Systems: Review of Systems  All other systems reviewed and are  negative.   Scheduled Meds:  amLODipine  2.5 mg Oral Daily   atorvastatin  20 mg Oral Daily   Chlorhexidine Gluconate Cloth  6 each Topical Daily   Continuous Infusions:   ceFAZolin (ANCEF) IV 2 g (08/16/21 1012)   PRN Meds:.acetaminophen **OR** acetaminophen, LORazepam, senna-docusate Allergies  Allergen Reactions   Ramipril     cough    OBJECTIVE: Vitals:   08/15/21 1249 08/15/21 2037 08/16/21 0528 08/16/21 1259  BP: 118/65 134/71 130/76 135/69  Pulse: (!) 54 (!) 58 (!) 56 66  Resp: 16 18 18 16   Temp: (!) 97.4 F (36.3 C) 98.7 F (37.1 C) 98.3 F (36.8 C) 98.1 F (36.7 C)  TempSrc:  Oral  Oral  SpO2: 99% 97% 96% 97%  Weight:      Height:       Body mass index is 25.12 kg/m.  Physical Exam Constitutional:      General: He is not in acute distress.    Appearance: He is normal weight. He is not toxic-appearing.  HENT:     Head: Normocephalic and atraumatic.     Right Ear: External ear normal.     Left Ear: External ear normal.     Nose: No congestion or rhinorrhea.     Mouth/Throat:     Mouth: Mucous membranes are moist.     Pharynx: Oropharynx is clear.  Eyes:     Extraocular Movements: Extraocular movements intact.     Conjunctiva/sclera: Conjunctivae normal.     Pupils: Pupils are equal, round, and reactive to light.  Cardiovascular:     Rate and Rhythm: Normal rate and regular  rhythm.     Heart sounds: No murmur heard.   No friction rub. No gallop.  Pulmonary:     Effort: Pulmonary effort is normal.     Breath sounds: Normal breath sounds.  Abdominal:     General: Abdomen is flat. Bowel sounds are normal.     Palpations: Abdomen is soft.  Musculoskeletal:        General: No swelling. Normal range of motion.     Cervical back: Normal range of motion and neck supple.  Skin:    General: Skin is warm and dry.  Neurological:     General: No focal deficit present.     Mental Status: He is oriented to person, place, and time.  Psychiatric:         Mood and Affect: Mood normal.      Lab Results Lab Results  Component Value Date   WBC 15.3 (H) 08/16/2021   HGB 11.3 (L) 08/16/2021   HCT 34.3 (L) 08/16/2021   MCV 92.7 08/16/2021   PLT 126 (L) 08/16/2021    Lab Results  Component Value Date   CREATININE 2.11 (H) 08/16/2021   BUN 34 (H) 08/16/2021   NA 136 08/16/2021   K 3.6 08/16/2021   CL 106 08/16/2021   CO2 23 08/16/2021    Lab Results  Component Value Date   ALT 36 08/14/2021   AST 61 (H) 08/14/2021   ALKPHOS 101 08/14/2021   BILITOT 0.8 08/14/2021        Laurice Record, [place] [facility] 08/16/2021, 3:57 PM

## 2021-08-16 NOTE — Progress Notes (Signed)
Assumed care of the patient and received report from the off-going nurse leaving at 0300. Agree with the patient's most recent and current assessment. Will continue to monitor patient for any changes until end of shift.

## 2021-08-16 NOTE — Progress Notes (Signed)
Echocardiogram 2D Echocardiogram has been performed.  Arlyss Gandy 08/16/2021, 3:31 PM

## 2021-08-16 NOTE — Progress Notes (Signed)
Progress Note   Patient: Robert Bright PJA:[id number] DOB: [birth year] DOA: 08/14/2021     2 DOS: the patient was seen and examined on 08/16/2021   Brief hospital course: 88 y.o. male with medical history significant for hypertension, hyperlipidemia, CKD 3B, prostate cancer status post prostatectomy in 1993, and history of urethral strictures who presents to the emergency department with suprapubic pain and difficulty voiding.  The patient reports that he saw his urologist for cystoscopy a little over a week ago and then had difficulty voiding a couple days later.  He was seen in the emergency department on 08/09/2021 where a Foley catheter was placed and he returned home with instruction to follow-up with his urologist.  He went on to develop suprapubic pain that continued to worsen and he noted that the catheter was not draining.  He denies any chest pain, cough, or shortness of breath.  In the ED, patient is found to be febrile to 38.4 C, slightly tachycardic, and with stable blood pressure.  EKG features sinus rhythm with first-degree AV nodal block and RBBB.  Chest x-ray negative for acute findings.  CT of the abdomen and pelvis demonstrates marked bladder distention with Foley tip in the prostatic urethra.  Chemistry panel notable for creatinine 2.66.  CBC features a leukocytosis to 15,100 and mild thrombocytopenia.  Lactic acid was 2.2.  Blood and urine cultures were collected and the patient was given 2-1/2 L of LR, acetaminophen, and cefepime.  Foley catheter was replaced in the emergency department and the patient was transported to [facility] for admission.  Assessment and Plan: No notes have been filed under this hospital service. Service: Hospitalist  1. Sepsis secondary to MSSA UTI and bacteremia - Presents with suprapubic pain and found to have fever, leukocytosis, and malpositioned Foley with bladder distension  - Foley was replaced in ED and draining; blood and urine  cultures were collected, 30 cc/kg LR bolus administered, and cefepime started  - Urine and blood cx pos for MSSA - 2d echo ordered, pending - Now on cefazolin. Appreciate ID recs -Repeat blood cx pending   2. AKI superimposed on CKD IIIb  - SCr is 2.66 on admission, up from 2.11 a few days earlier  - Foley was not draining and causing obstruction, foley was replaced in ED now with good urine output this AM - Cr down to 2.11 -Appreciate Urology input. Recs to cont foley   3. Hypertension  - Continue Norvasc as tolerated    4. Urinary retention  - Foley initially replaced in ED -Cont foley cath per Urology  5. Hematuria - Bleeding noted around foley cath this AM -Hgb overall stable -Now resolved -Appreciate Urology input      Subjective: Complaining of bleeding around catheter  Physical Exam: Vitals:   08/15/21 1249 08/15/21 2037 08/16/21 0528 08/16/21 1259  BP: 118/65 134/71 130/76 135/69  Pulse: (!) 54 (!) 58 (!) 56 66  Resp: 16 18 18 16   Temp: (!) 97.4 F (36.3 C) 98.7 F (37.1 C) 98.3 F (36.8 C) 98.1 F (36.7 C)  TempSrc:  Oral  Oral  SpO2: 99% 97% 96% 97%  Weight:      Height:       General exam: Conversant, in no acute distress Respiratory system: normal chest rise, clear, no audible wheezing Cardiovascular system: regular rhythm, s1-s2 Gastrointestinal system: Nondistended, nontender, pos BS Central nervous system: No seizures, no tremors Extremities: No cyanosis, no joint deformities Skin: No rashes, no pallor Psychiatry:  Affect normal // no auditory hallucinations    Data Reviewed:  Labs reviewed, hgb 10.6  Family Communication: Pt in room, family not at bedside  Disposition: Status is: Inpatient Remains inpatient appropriate because: Severity of illness    Planned Discharge Destination: Home     Author: Marylu Lund, MD 08/16/2021 2:14 PM  For on call review [email or web address].

## 2021-08-16 NOTE — Progress Notes (Signed)
Patient ambulated in hallway with front wheeled walker.  Tolerated well.

## 2021-08-16 NOTE — Consult Note (Addendum)
[place] for Infectious Disease    Date of Admission:  08/14/2021   Total days of inpatient antibiotics 2        Reason for Consult: MSSA bacteremia    Principal Problem:   Sepsis secondary to UTI [facility]) Active Problems:   Hypertension   Acute renal failure superimposed on stage 3b chronic kidney disease ([place])   Urinary retention   Assessment: 88 year old male with a history of prostate cancer SP prostatectomy in 1993 followed by urology.  Underwent urological procedure on 2/17.  Came to the ED on 2/21 for  abdominal pain and Foley catheter placed due to urinary retention. Pain worsened and pt re-presented to the  ED and admitted for sepsis secondary to UTI.  Found to have MSSA bacteremia ID consulted.  # MSSA bacteremia likely 2/2 recent urologic intervention(cycloscopy with fulguration) -CT showed distention of urinary bladder and foley catheter tip in prostatic urethra. Right ureteropelvic junction obstruction. Urology following per malpositioned foley. -Urine Cx+ staph aureus -Pt underwent cystoscopy on 2/17 with Dr. Borden(Urology) with fulguration of small bladder polyp.  Reports has had worsening abdominal pain since that time. I think this is the most likely source of bacteremia as Urine cx+ staph aureus.  he has no recent hospitalization, no lines placed prior to hospitalization, no hardware in place.  Recommendations:  -D/C cefepime -Start cefazolin -Follow-up TTE -Repeat blood Cx Microbiology:   Antibiotics: Cefepime  Cultures: Blood 2/26 1/2 MSSA    HPI: ANDERSON MIDDLEBROOKS is a 88 y.o. male hypertension, hyperlipidemia, CKD stage III, prostate cancer status post prostatectomy 1993, urethral strictures admitted for sepsis secondary to suspected UTI.  Patient presented with suprapubic pain and difficulty voiding.  He reports that he had a cystoscopy at the urologist office a couple weeks ago and has had abdominal pain since that time.  Seen in the ED on  2/21 Foley catheter was placed pain continued to worsen.   On arrival to the ED he had a temp of 101.2, wbc 15k.  CT abdomen pelvis revealed marked distention of bladder, Foley tip and proximal urethra.  Blood cultures revealed 1/2 MSSA.  ID consulted.   Review of Systems: ROS  Past Medical History:  Diagnosis Date   Anxiety    Bladder cancer ([place]) 1993   Being followed by [name]   ED (erectile dysfunction)    GERD (gastroesophageal reflux disease)    Heart murmur    Hypercholesterolemia    Hyperlipidemia    Hypertension    with a component of whitecoat syndrome   Prostate cancer ([place]) 1993   Hx ostatectomyof remote treated with radial pr   Shingles    Stress incontinence    Ureteral stricture    Vertigo     Social History   Tobacco Use   Smoking status: Former    Types: Cigarettes    Quit date: 01/16/1986    Years since quitting: 35.6   Smokeless tobacco: Never  Vaping Use   Vaping Use: Never used  Substance Use Topics   Alcohol use: No   Drug use: No    Family History  Problem Relation Age of Onset   Dementia Mother    Arthritis/Rheumatoid Father    Healthy Sister    Healthy Sister    Scheduled Meds:  amLODipine  2.5 mg Oral Daily   atorvastatin  20 mg Oral Daily   Chlorhexidine Gluconate Cloth  6 each Topical Daily   Continuous Infusions:  ceFAZolin (ANCEF) IV 2 g (08/15/21 1827)   PRN Meds:.acetaminophen **OR** acetaminophen, LORazepam, senna-docusate Allergies  Allergen Reactions   Ramipril     cough    OBJECTIVE: Blood pressure 130/76, pulse (!) 56, temperature 98.3 F (36.8 C), resp. rate 18, height 5\' 10"  (1.778 m), weight 79.4 kg, SpO2 96 %.  Physical Exam  Lab Results Lab Results  Component Value Date   WBC 15.3 (H) 08/16/2021   HGB 11.3 (L) 08/16/2021   HCT 34.3 (L) 08/16/2021   MCV 92.7 08/16/2021   PLT 126 (L) 08/16/2021    Lab Results  Component Value Date   CREATININE 2.11 (H) 08/16/2021   BUN 34 (H) 08/16/2021   NA  136 08/16/2021   K 3.6 08/16/2021   CL 106 08/16/2021   CO2 23 08/16/2021    Lab Results  Component Value Date   ALT 36 08/14/2021   AST 61 (H) 08/14/2021   ALKPHOS 101 08/14/2021   BILITOT 0.8 08/14/2021       Laurice Record, [place] for Infectious Disease [facility] 08/16/2021, 8:39 AM

## 2021-08-16 NOTE — Plan of Care (Signed)
  Problem: Education: Goal: Knowledge of General Education information will improve Description: Including pain rating scale, medication(s)/side effects and non-pharmacologic comfort measures Outcome: Progressing   Problem: Clinical Measurements: Goal: Ability to maintain clinical measurements within normal limits will improve Outcome: Progressing   

## 2021-08-17 LAB — CBC
HCT: 37.1 % — ABNORMAL LOW (ref 39.0–52.0)
Hemoglobin: 12.3 g/dL — ABNORMAL LOW (ref 13.0–17.0)
MCH: 30.2 pg (ref 26.0–34.0)
MCHC: 33.2 g/dL (ref 30.0–36.0)
MCV: 91.2 fL (ref 80.0–100.0)
Platelets: 153 10*3/uL (ref 150–400)
RBC: 4.07 MIL/uL — ABNORMAL LOW (ref 4.22–5.81)
RDW: 13.8 % (ref 11.5–15.5)
WBC: 10.9 10*3/uL — ABNORMAL HIGH (ref 4.0–10.5)
nRBC: 0 % (ref 0.0–0.2)

## 2021-08-17 LAB — URINE CULTURE: Culture: >=100000 — AB

## 2021-08-17 LAB — CULTURE, BLOOD (ROUTINE X 2): Special Requests: ADEQUATE

## 2021-08-17 LAB — BASIC METABOLIC PANEL
Anion gap: 8 (ref 5–15)
BUN: 28 mg/dL — ABNORMAL HIGH (ref 8–23)
CO2: 24 mmol/L (ref 22–32)
Calcium: 8.4 mg/dL — ABNORMAL LOW (ref 8.9–10.3)
Chloride: 105 mmol/L (ref 98–111)
Creatinine, Ser: 1.98 mg/dL — ABNORMAL HIGH (ref 0.61–1.24)
GFR, Estimated: 32 mL/min — ABNORMAL LOW (ref >60)
Glucose, Bld: 101 mg/dL — ABNORMAL HIGH (ref 70–99)
Potassium: 3.7 mmol/L (ref 3.5–5.1)
Sodium: 137 mmol/L (ref 135–145)

## 2021-08-17 NOTE — Progress Notes (Signed)
Pt has voided twice since foley removal, both occurences were unmeasured d/t voiding while trying to have a BM. PVR has been 62ml . Will continue to monitor PVR when pt voids.   ?

## 2021-08-17 NOTE — Progress Notes (Signed)
PHARMACY CONSULT NOTE FOR: ? ?OUTPATIENT  PARENTERAL ANTIBIOTIC THERAPY (OPAT) ? ?Indication: MSSA Bacteremia ?Regimen: Cefazolin 4g daily as a continuous infusion ?End date: 09/12/21 ? ?IV antibiotic discharge orders are pended. ?To discharging provider:  please sign these orders via discharge navigator,  ?Select New Orders & click on the button choice - Manage This Unsigned Work.  ?  ? ?Thank you for allowing pharmacy to be a part of this patient?s care. ? ?Alycia Rossetti, PharmD, BCPS ?Infectious Diseases Clinical Pharmacist ?08/18/2021 8:24 AM  ? ?**Pharmacist phone directory can now be found on [email or web address] ([email or web address]).  Listed under [place].  ?

## 2021-08-17 NOTE — Progress Notes (Signed)
[facility] ? ?PROGRESS NOTE ? ?Robert Bright OBS:[id number] DOB: 12-17-1930 DOA: 08/14/2021 ?PCP: Elby Showers, MD ? ? ?Brief HPI:   ?88 year old male with history of hypertension, hyperlipidemia, CKD stage IIIb, prostate cancer status post prostatectomy in 1993, history of urethral strictures presented to the ED with suprapubic pain and difficulty voiding.  As per patient he saw his urologist for cystoscopy of a week ago and had difficulty voiding couple of days later.  In the ED Foley catheter was placed on 2/21 and was sent home.  Patient developed suprapubic pain and catheter was not draining so came back to the ED.  CT abdomen/pelvis showed marked bladder distention with Foley tip in the prostatic urethra.  Foley cath was replaced in the ED and patient was transported to [facility] for admission.  Urology was consulted. ? ? ? ?Subjective  ? ?Denies pain this morning. ? ? Assessment/Plan:  ? ? ? ?Sepsis due to UTI ?-Presented with suprapubic pain, found to have fever, leukocytosis and malpositioned Foley with bladder distention ?-Foley was replaced in the ED and is draining ?-He was started on cefepime, 30 cc/kg LR bolus was administered ?-Blood and urine culture culture positive for MSSA, antibiotics was changed to cefazolin  ?-ID following ? ?Acute kidney injury on CKD stage IIIb ?-Patient's creatinine on admission was 2.66 ?-Foley catheter was not draining and causing obstruction, Foley was replaced in the ED ?-Creatinine is down to 1.98 today ?-Foley catheter has been discontinued per urology ? ?Hypertension ?-Continue Norvasc ? ?Urinary retention ?-Due to malpositioned Foley catheter ?-Foley catheter was replaced in the ED ?-Foley catheter has been discontinued per urology for voiding trial ? ?Hematuria ?-Resolved ? ? ? ? ?Medications ? ?  ? amLODipine  2.5 mg Oral Daily  ? atorvastatin  20 mg Oral Daily  ? Chlorhexidine Gluconate Cloth  6 each Topical Daily  ? ? ? Data Reviewed:   ? ?CBG: ? ?No results for input(s): GLUCAP in the last 168 hours. ? ?SpO2: 99 %  ? ? ?Vitals:  ? 08/16/21 2128 08/17/21 0554 08/17/21 1011 08/17/21 1159  ?BP: 135/71 (!) 141/68 140/89 (!) 157/81  ?Pulse: (!) 55 64 60 61  ?Resp: 18 18  14   ?Temp: 97.9 ?F (36.6 ?C) 98.1 ?F (36.7 ?C) 98.2 ?F (36.8 ?C) 98.5 ?F (36.9 ?C)  ?TempSrc: Oral Oral Oral Oral  ?SpO2: 94% 97% 99% 99%  ?Weight:      ?Height:      ? ? ? ? ?Data Reviewed: ? ?Basic Metabolic Panel: ?Recent Labs  ?Lab 08/14/21 ?1515 08/15/21 ?4765 08/16/21 ?0426 08/17/21 ?0433  ?NA 137 135 136 137  ?K 3.4* 3.7 3.6 3.7  ?CL 104 106 106 105  ?CO2 19* 21* 23 24  ?GLUCOSE 92 108* 94 101*  ?BUN 41* 38* 34* 28*  ?CREATININE 2.66* 2.37* 2.11* 1.98*  ?CALCIUM 9.1 8.3* 8.2* 8.4*  ? ? ?CBC: ?Recent Labs  ?Lab 08/14/21 ?1515 08/15/21 ?4650 08/16/21 ?0426 08/17/21 ?0433  ?WBC 15.1* 21.2* 15.3* 10.9*  ?NEUTROABS 14.3*  --   --   --   ?HGB 11.9* 10.6* 11.3* 12.3*  ?HCT 36.3* 32.0* 34.3* 37.1*  ?MCV 91.7 93.6 92.7 91.2  ?PLT 142* 120* 126* 153  ? ? ?LFT ?Recent Labs  ?Lab 08/14/21 ?1515  ?AST 61*  ?ALT 36  ?ALKPHOS 101  ?BILITOT 0.8  ?PROT 6.6  ?ALBUMIN 3.5  ? ?  ?Antibiotics: ?Anti-infectives (From admission, onward)  ? ? Start     Dose/Rate  Route Frequency Ordered Stop  ? 08/15/21 1800  ceFAZolin (ANCEF) IVPB 2g/100 mL premix       ? 2 g ?200 mL/hr over 30 Minutes Intravenous Every 12 hours 08/15/21 1328    ? 08/14/21 1630  ceFEPIme (MAXIPIME) 2 g in sodium chloride 0.9 % 100 mL IVPB  Status:  Discontinued       ? 2 g ?200 mL/hr over 30 Minutes Intravenous Every 24 hours 08/14/21 1622 08/15/21 1328  ? 08/14/21 1615  ceFEPIme (MAXIPIME) 2 g in sodium chloride 0.9 % 100 mL IVPB  Status:  Discontinued       ? 2 g ?200 mL/hr over 30 Minutes Intravenous  Once 08/14/21 1611 08/14/21 1623  ? ?  ? ? ? ?DVT prophylaxis: SCDs ? ?Code Status: Full code ? ?Family Communication: No family at bedside ? ? ?CONSULTS urology ? ? ?Objective  ? ? ?Physical Examination: ? ? ?General-appears in no  acute distress ?Heart-S1-S2, regular, no murmur auscultated ?Lungs-clear to auscultation bilaterally, no wheezing or crackles auscultated ?Abdomen-soft, nontender, no organomegaly ?Extremities-no edema in the lower extremities ?Neuro-alert, oriented x3, no focal deficit noted ? ?Status is: Inpatient: Bacteremia ? ? ? ?  ? ? ? ? ? ? ? ?[name] ?  ?[facility] ?If 7PM-7AM, please contact night-coverage at [email or web address], ?Office  [phone] ? ? ?08/17/2021, 7:03 PM  LOS: 3 days  ? ? ? ? ? ? ? ? ? ? ?  ?

## 2021-08-17 NOTE — Progress Notes (Signed)
Pt has only voided x2 since foley removal at 8:30am. Bladder scanner reads 175ml at this time. Ambulated pt to bathroom to attempt to void. Pt was not able to void at this time. Will report off to night shift RN to monitor for urinary retention.  ?

## 2021-08-17 NOTE — Progress Notes (Signed)
Patient ID: Robert Bright, male   DOB: 06-27-1930, 88 y.o.   MRN: [id number] ? ?  ?Subjective: ?Pt doing well today.  Denies pain.  S/P cysto with fulguration of very small, low grade appearing bladder tumor on 2/17.  He developed urinary retention and had catheter placed in ER on 2/21.  At that time, urine was clear without evidence of infection.  Presented back to the hospital on 2/26 with abdominal pian at which time his catheter balloon was noted to have been blown up in urethra.  He was seen by urology and catheter repositioned appropriately.  Urine and blood cultures from 2/26 indicated S. aureus.   ? ?Objective: ?Vital signs in last 24 hours: ?Temp:  [97.9 ?F (36.6 ?C)-98.1 ?F (36.7 ?C)] 98.1 ?F (36.7 ?C) (03/01 0554) ?Pulse Rate:  [55-66] 64 (03/01 0554) ?Resp:  [16-18] 18 (03/01 0554) ?BP: (135-141)/(68-71) 141/68 (03/01 0554) ?SpO2:  [94 %-97 %] 97 % (03/01 0554) ? ?Intake/Output from previous day: ?02/28 0701 - 03/01 0700 ?In: 240 [P.O.:240] ?Out: 2000 [Urine:2000] ?Intake/Output this shift: ?No intake/output data recorded. ? ?Physical Exam:  ?General: Alert and oriented ?GU: Catheter in place and draining grossly clear urine. ? ?Lab Results: ?Recent Labs  ?  08/15/21 ?0881 08/16/21 ?0426 08/17/21 ?0433  ?HGB 10.6* 11.3* 12.3*  ?HCT 32.0* 34.3* 37.1*  ? ?CBC Latest Ref Rng & Units 08/17/2021 08/16/2021 08/15/2021  ?WBC 4.0 - 10.5 K/uL 10.9(H) 15.3(H) 21.2(H)  ?Hemoglobin 13.0 - 17.0 g/dL 12.3(L) 11.3(L) 10.6(L)  ?Hematocrit 39.0 - 52.0 % 37.1(L) 34.3(L) 32.0(L)  ?Platelets 150 - 400 K/uL 153 126(L) 120(L)  ? ? ? ?BMET ?Recent Labs  ?  08/16/21 ?0426 08/17/21 ?0433  ?NA 136 137  ?K 3.6 3.7  ?CL 106 105  ?CO2 23 24  ?GLUCOSE 94 101*  ?BUN 34* 28*  ?CREATININE 2.11* 1.98*  ?CALCIUM 8.2* 8.4*  ? ? ? ?Studies/Results: ? ?Assessment/Plan: ?1) UTI with bacteremia after iatrogenic trauma from urethral catheter placement: Will proceed with voiding trial today as patient is at low risk for recurrent urinary retention  (s/p radical prostatectomy in the distant past) and renal function is back to normal.  Antibiotic recommendations per ID.  Will monitor PVRs today.  He can f/u routinely as outpatient with me as already scheduled in a few months for surveillance cystoscopy. ? ? LOS: 3 days  ? ?Les Alinda Money ?08/17/2021, 7:16 AM ? ?  ?

## 2021-08-17 NOTE — Progress Notes (Signed)
?   ? ? ? ? ?[place] for Infectious Disease ? ?Date of Admission:  08/14/2021   Total days of inpatient antibiotics 3 ? ?Principal Problem: ?  Sepsis secondary to UTI [facility]) ?Active Problems: ?  Hypertension ?  Acute renal failure superimposed on stage 3b chronic kidney disease ([place]) ?  Urinary retention ?     ?    ?Assessment: ?88 year old male with a history of prostate cancer SP prostatectomy in 1993 followed by urology.  Underwent urological procedure on 2/17.  Came to the ED on 2/21 for  abdominal pain and Foley catheter placed due to urinary retention. Pain worsened and pt re-presented to the  ED and admitted for sepsis secondary to UTI.  Found to have MSSA bacteremia ID consulted. ?  ?# MSSA bacteremia likely 2/2 recent urologic intervention(cycloscopy with fulguration) ?-CT showed distention of urinary bladder and foley catheter tip in prostatic urethra. Right ureteropelvic junction obstruction. Urology following per malpositioned foley. ?-Urine Cx+ MSSA ?-Pt underwent cystoscopy on 2/17 with Dr. Borden(Urology) with fulguration of small bladder polyp.  Reports has had worsening abdominal pain since that time. I think this is the most likely source of bacteremia as Urine cx+ staph aureus.  he has no recent hospitalization, no lines placed prior to hospitalization, no hardware in place. ?-TTE did not show valvular vegetation.  ? ?Recommendations:  ?- Continue cefazolin to complete 4 weeks of antibiotics from negative Cx(EOT 09/12/21) ?-Appears family is attempting to coordinate  coming down to help pt with antibiotics. If family is not able to help, would recommend SNF placement to complete antibiotics(if this is the case continuous infusion cefazolin can be switched to 2g q12h). ?- Follow-up repeat blood Cx to ensure clearance ? ?OPAT ORDERS: ? ?Diagnosis: MSSA bacteremia ? ?Culture Result: MSSA+ blood Cx ? ?Allergies  ?Allergen Reactions  ? Ramipril   ?  cough  ? ? ? ?Discharge antibiotics to be given  via PICC line: ? ?Per pharmacy protocol cefazolin 4gm q24h(continuous infusion) ? ? ? ?Duration: ?4 weeks ?End Date: ?09/12/21 ? ?[facility] Care Per Protocol with [employer] Use: ?Home health RN for IV administration and teaching, line care and labs.   ? ?Labs weekly while on IV antibiotics: ?__ CBC with differential ?__ CMP ?__ CRP ?__ ESR ?__ Please pull PIC at completion of IV antibiotics ? ? ?Fax weekly labs to [phone] ? ?Clinic Follow Up Appt: ?09/08/21 at 9AM ? ?@ [facility] with Dr. Candiss Norse  ? ? ?Microbiology:   ?Antibiotics: ?Cefepime 2/26 ?Cefazolin 2/27-p ?  ?Cultures: ?Blood ?2/26 1/2 MSSA ?2/28 pending ?SUBJECTIVE: ?No new complaints, no significant overnight events.  ?Interval: Afebrile overnight. Wbc 15.3k ?Review of Systems: ?Review of Systems  ?All other systems reviewed and are negative. ? ? ?Scheduled Meds: ? amLODipine  2.5 mg Oral Daily  ? atorvastatin  20 mg Oral Daily  ? Chlorhexidine Gluconate Cloth  6 each Topical Daily  ? ?Continuous Infusions: ?  ceFAZolin (ANCEF) IV 2 g (08/17/21 1015)  ? ?PRN Meds:.acetaminophen **OR** acetaminophen, LORazepam, senna-docusate ?Allergies  ?Allergen Reactions  ? Ramipril   ?  cough  ? ? ?OBJECTIVE: ?Vitals:  ? 08/16/21 2128 08/17/21 0554 08/17/21 1011 08/17/21 1159  ?BP: 135/71 (!) 141/68 140/89 (!) 157/81  ?Pulse: (!) 55 64 60 61  ?Resp: '18 18  14  ' ?Temp: 97.9 ?F (36.6 ?C) 98.1 ?F (36.7 ?C) 98.2 ?F (36.8 ?C) 98.5 ?F (36.9 ?C)  ?TempSrc: Oral Oral Oral Oral  ?SpO2: 94% 97% 99% 99%  ?Weight:      ?  Height:      ? ?Body mass index is 25.12 kg/m?. ? ?Physical Exam ?Constitutional:   ?   General: He is not in acute distress. ?   Appearance: He is normal weight. He is not toxic-appearing.  ?HENT:  ?   Head: Normocephalic and atraumatic.  ?   Right Ear: External ear normal.  ?   Left Ear: External ear normal.  ?   Nose: No congestion or rhinorrhea.  ?   Mouth/Throat:  ?   Mouth: Mucous membranes are moist.  ?   Pharynx: Oropharynx is clear.  ?Eyes:  ?   Extraocular  Movements: Extraocular movements intact.  ?   Conjunctiva/sclera: Conjunctivae normal.  ?   Pupils: Pupils are equal, round, and reactive to light.  ?Cardiovascular:  ?   Rate and Rhythm: Normal rate and regular rhythm.  ?   Heart sounds: No murmur heard. ?  No friction rub. No gallop.  ?Pulmonary:  ?   Effort: Pulmonary effort is normal.  ?   Breath sounds: Normal breath sounds.  ?Abdominal:  ?   General: Abdomen is flat. Bowel sounds are normal.  ?   Palpations: Abdomen is soft.  ?Musculoskeletal:     ?   General: No swelling. Normal range of motion.  ?   Cervical back: Normal range of motion and neck supple.  ?Skin: ?   General: Skin is warm and dry.  ?Neurological:  ?   General: No focal deficit present.  ?   Mental Status: He is oriented to person, place, and time.  ?Psychiatric:     ?   Mood and Affect: Mood normal.  ? ? ? ? ?Lab Results ?Lab Results  ?Component Value Date  ? WBC 10.9 (H) 08/17/2021  ? HGB 12.3 (L) 08/17/2021  ? HCT 37.1 (L) 08/17/2021  ? MCV 91.2 08/17/2021  ? PLT 153 08/17/2021  ?  ?Lab Results  ?Component Value Date  ? CREATININE 1.98 (H) 08/17/2021  ? BUN 28 (H) 08/17/2021  ? NA 137 08/17/2021  ? K 3.7 08/17/2021  ? CL 105 08/17/2021  ? CO2 24 08/17/2021  ?  ?Lab Results  ?Component Value Date  ? ALT 36 08/14/2021  ? AST 61 (H) 08/14/2021  ? ALKPHOS 101 08/14/2021  ? BILITOT 0.8 08/14/2021  ?  ? ? ? ? ?Laurice Record, MD ?[facility] for Infectious Disease ?[facility] ?08/17/2021, 1:15 PM  ?

## 2021-08-18 ENCOUNTER — Inpatient Hospital Stay: Payer: Self-pay

## 2021-08-18 LAB — BASIC METABOLIC PANEL
Anion gap: 7 (ref 5–15)
BUN: 26 mg/dL — ABNORMAL HIGH (ref 8–23)
CO2: 22 mmol/L (ref 22–32)
Calcium: 8.4 mg/dL — ABNORMAL LOW (ref 8.9–10.3)
Chloride: 104 mmol/L (ref 98–111)
Creatinine, Ser: 1.81 mg/dL — ABNORMAL HIGH (ref 0.61–1.24)
GFR, Estimated: 36 mL/min — ABNORMAL LOW (ref >60)
Glucose, Bld: 102 mg/dL — ABNORMAL HIGH (ref 70–99)
Potassium: 3.4 mmol/L — ABNORMAL LOW (ref 3.5–5.1)
Sodium: 133 mmol/L — ABNORMAL LOW (ref 135–145)

## 2021-08-18 MED ORDER — SODIUM CHLORIDE 0.9 % IV SOLN
INTRAVENOUS | Status: DC | PRN
Start: 2021-08-18 — End: 2021-08-20

## 2021-08-18 MED ORDER — SODIUM CHLORIDE 0.9% FLUSH: 10.0000 mL | INTRAVENOUS | Status: DC | PRN

## 2021-08-18 MED ORDER — SODIUM CHLORIDE 0.9% FLUSH
10.0000 mL | Freq: Two times a day (BID) | INTRAVENOUS | Status: DC
  Administered 2021-08-18 – 2021-08-19 (×3): 10 mL

## 2021-08-18 MED ORDER — POTASSIUM CHLORIDE CRYS ER 20 MEQ PO TBCR
40.0000 meq | EXTENDED_RELEASE_TABLET | Freq: Once | ORAL | Status: AC
  Administered 2021-08-18: 40 meq via ORAL
  Filled 2021-08-18: qty 2

## 2021-08-18 NOTE — Plan of Care (Signed)

## 2021-08-18 NOTE — Progress Notes (Signed)
Per verbal order from Dr Alinda Money, continue to bladder scan pt after voiding.  If bladder has >500, place a 14Fr foley catheter to be removed by his office after discharge. ?

## 2021-08-18 NOTE — Progress Notes (Signed)
[facility] ? ?PROGRESS NOTE ? ?Robert Bright VEH:[id number] DOB: Oct 17, 1930 DOA: 08/14/2021 ?PCP: Elby Showers, MD ? ? ?Brief HPI:   ?88 year old male with history of hypertension, hyperlipidemia, CKD stage IIIb, prostate cancer status post prostatectomy in 1993, history of urethral strictures presented to the ED with suprapubic pain and difficulty voiding.  As per patient he saw his urologist for cystoscopy of a week ago and had difficulty voiding couple of days later.  In the ED Foley catheter was placed on 2/21 and was sent home.  Patient developed suprapubic pain and catheter was not draining so came back to the ED.  CT abdomen/pelvis showed marked bladder distention with Foley tip in the prostatic urethra.  Foley cath was replaced in the ED and patient was transported to [facility] for admission.  Urology was consulted. ? ? ? ?Subjective  ? ?Patient having increasing postvoid residuals.  Urology recommends to reinsert indwelling Foley catheter if continues to have increased PVRs. ? ? Assessment/Plan:  ? ? ? ?Sepsis due to UTI ?-Presented with suprapubic pain, found to have fever, leukocytosis and malpositioned Foley with bladder distention ?-Foley was replaced in the ED and is draining ?-He was started on cefepime, 30 cc/kg LR bolus was administered ?-Blood and urine culture culture positive for MSSA, antibiotics was changed to cefazolin  ?-ID following ?-Repeat blood cultures from 08/16/2021 is negative to date ?-Will insert PICC line to complete antibiotics, last day of antibiotics is 09/12/2021 ? ?Acute kidney injury on CKD stage IIIb ?-Patient's creatinine on admission was 2.66 ?-Foley catheter was not draining and causing obstruction, Foley was replaced in the ED ?-Creatinine is down to 1.81 today ?-Foley catheter will be reinserted if patient continues to have high postvoid residuals ? ?Hypertension ?-Continue Norvasc ? ?Urinary retention ?-Due to malpositioned Foley catheter ?-Foley  catheter was replaced in the ED ?-Foley catheter was initially discontinued however will likely be reinserted due to elevated PVR ? ?Hematuria ?-Resolved ? ? ? ? ?Medications ? ?  ? amLODipine  2.5 mg Oral Daily  ? atorvastatin  20 mg Oral Daily  ? Chlorhexidine Gluconate Cloth  6 each Topical Daily  ? sodium chloride flush  10-40 mL Intracatheter Q12H  ? ? ? Data Reviewed:  ? ?CBG: ? ?No results for input(s): GLUCAP in the last 168 hours. ? ?SpO2: 98 %  ? ? ?Vitals:  ? 08/17/21 2056 08/18/21 0452 08/18/21 1004 08/18/21 1313  ?BP: 132/70 (!) 140/57 (!) 147/62 (!) 148/77  ?Pulse: (!) 59 (!) 52 (!) 49 (!) 53  ?Resp: 16 16    ?Temp: 99.4 ?F (37.4 ?C) 98.9 ?F (37.2 ?C)  98.7 ?F (37.1 ?C)  ?TempSrc: Oral Oral  Oral  ?SpO2: 96% 96% 97% 98%  ?Weight:      ?Height:      ? ? ? ? ?Data Reviewed: ? ?Basic Metabolic Panel: ?Recent Labs  ?Lab 08/14/21 ?1515 08/15/21 ?8366 08/16/21 ?2947 08/17/21 ?6546 08/18/21 ?0457  ?NA 137 135 136 137 133*  ?K 3.4* 3.7 3.6 3.7 3.4*  ?CL 104 106 106 105 104  ?CO2 19* 21* 23 24 22   ?GLUCOSE 92 108* 94 101* 102*  ?BUN 41* 38* 34* 28* 26*  ?CREATININE 2.66* 2.37* 2.11* 1.98* 1.81*  ?CALCIUM 9.1 8.3* 8.2* 8.4* 8.4*  ? ? ?CBC: ?Recent Labs  ?Lab 08/14/21 ?1515 08/15/21 ?5035 08/16/21 ?0426 08/17/21 ?0433  ?WBC 15.1* 21.2* 15.3* 10.9*  ?NEUTROABS 14.3*  --   --   --   ?HGB  11.9* 10.6* 11.3* 12.3*  ?HCT 36.3* 32.0* 34.3* 37.1*  ?MCV 91.7 93.6 92.7 91.2  ?PLT 142* 120* 126* 153  ? ? ?LFT ?Recent Labs  ?Lab 08/14/21 ?1515  ?AST 61*  ?ALT 36  ?ALKPHOS 101  ?BILITOT 0.8  ?PROT 6.6  ?ALBUMIN 3.5  ? ?  ?Antibiotics: ?Anti-infectives (From admission, onward)  ? ? Start     Dose/Rate Route Frequency Ordered Stop  ? 08/15/21 1800  ceFAZolin (ANCEF) IVPB 2g/100 mL premix       ? 2 g ?200 mL/hr over 30 Minutes Intravenous Every 12 hours 08/15/21 1328    ? 08/14/21 1630  ceFEPIme (MAXIPIME) 2 g in sodium chloride 0.9 % 100 mL IVPB  Status:  Discontinued       ? 2 g ?200 mL/hr over 30 Minutes Intravenous Every 24  hours 08/14/21 1622 08/15/21 1328  ? 08/14/21 1615  ceFEPIme (MAXIPIME) 2 g in sodium chloride 0.9 % 100 mL IVPB  Status:  Discontinued       ? 2 g ?200 mL/hr over 30 Minutes Intravenous  Once 08/14/21 1611 08/14/21 1623  ? ?  ? ? ? ?DVT prophylaxis: SCDs ? ?Code Status: Full code ? ?Family Communication: No family at bedside ? ? ?CONSULTS urology ? ? ?Objective  ? ? ?Physical Examination: ? ? ?General-appears in no acute distress ?Heart-S1-S2, regular, no murmur auscultated ?Lungs-clear to auscultation bilaterally, no wheezing or crackles auscultated ?Abdomen-soft, nontender, no organomegaly ?Extremities-no edema in the lower extremities ?Neuro-alert, oriented x3, no focal deficit noted ? ?Status is: Inpatient: Bacteremia ? ? ? ?  ? ? ? ? ? ? ? ?[name] ?  ?[facility] ?If 7PM-7AM, please contact night-coverage at [email or web address], ?Office  [phone] ? ? ?08/18/2021, 4:51 PM  LOS: 4 days  ? ? ? ? ? ? ? ? ? ? ?  ?

## 2021-08-18 NOTE — Progress Notes (Signed)
End of shift ? ?Pt hasn't been successful with emptying his bladder when voiding.  Dr Alinda Money but an order in that we are to bladder scan after voiding and if there is >500 we are to place a 14 fr foley and it will be removed in his office at a follow up appointment. ? ?Pt received a PICC line today in his right arm.  Anticipate pt being discharged 3/3 and going home with IV ABX.  Pt has home health and they called today about his discharge plan.  I transferred the [facility] RN to CM.  ? ?

## 2021-08-18 NOTE — Progress Notes (Signed)
PVRs increased last night resulting in I and O cath with over 800 cc. ? ?Will continue to monitor PVRs closely today and will consider replacement of indwelling catheter if still unable to void well today. Nursing staff updated and will record PVRs in progress notes. ?

## 2021-08-18 NOTE — TOC Initial Note (Addendum)
Transition of Care (TOC) - Initial/Assessment Note  ? ? ?Patient Details  ?Name: Robert Bright ?MRN: [id number] ?Date of Birth: [birth year] ? ?Transition of Care [facility] CM/SW Contact:    ?Dessa Phi, RN ?Phone Number: ?08/18/2021, 9:29 AM ? ?Clinical Narrative: Noted long term iv abx-[employer] rep Robert Bright already following;left vm w/son Robert Bright await call back to arrange [facility] agency for ongoing teaching.  ?-2:51p-No [place] agency to accept [facility]              ?[facility] ?[facility] ?[facility] ?[facility] ?[facility] ?[place] ? Will ask [employer] rep Robert Bright to check on private duty agency to provide [facility]. ? ?Expected Discharge Plan: [place] ?Barriers to Discharge: Continued Medical Work up ? ? ?Patient Goals and CMS Choice ?Patient states their goals for this hospitalization and ongoing recovery are:: Home ?CMS Medicare.gov Compare Post Acute Care list provided to:: Patient Represenative (must comment) Robert Bright (son) [phone]) ?Choice offered to / list presented to : Adult Children ? ?Expected Discharge Plan and Services ?Expected Discharge Plan: [place] ?  ?Discharge Planning Services: CM Consult ?Post Acute Care Choice: Home Health ?Living arrangements for the past 2 months: [place] ?                ?  ?  ?  ?  ?  ?  ?  ?  ?  ?  ? ?Prior Living Arrangements/Services ?Living arrangements for the past 2 months: [place] ?Lives with:: Self ?Patient language and need for interpreter reviewed:: Yes ?Do you feel safe going back to the place where you live?: Yes      ?Need for Family Participation in Patient Care: Yes (Comment) ?Care giver support system in place?: Yes (comment) ?  ?Criminal Activity/Legal Involvement Pertinent to Current Situation/Hospitalization: No - Comment as needed ? ?Activities of Daily Living ?Home Assistive Devices/Equipment: Cane (specify quad or straight), Dentures (specify type), Eyeglasses (implants upper and lower; reading glasses) ?ADL Screening  (condition at time of admission) ?Patient's cognitive ability adequate to safely complete daily activities?: Yes ?Is the patient deaf or have difficulty hearing?: No ?Does the patient have difficulty seeing, even when wearing glasses/contacts?: No ?Does the patient have difficulty concentrating, remembering, or making decisions?: No ?Patient able to express need for assistance with ADLs?: Yes ?Does the patient have difficulty dressing or bathing?: No ?Independently performs ADLs?: Yes (appropriate for developmental age) ?Does the patient have difficulty walking or climbing stairs?: Yes ?Weakness of Legs: None ?Weakness of Arms/Hands: None ? ?Permission Sought/Granted ?Permission sought to share information with : Case Manager ?Permission granted to share information with : Yes, Verbal Permission Granted ? Share Information with NAME: Case manager ?   ?   ?   ? ?Emotional Assessment ?  ?  ?  ?  ?  ?  ? ?Admission diagnosis:  Sepsis secondary to UTI ([place]) [A41.9, N39.0] ?Sepsis with acute renal failure without septic shock, due to unspecified organism, unspecified acute renal failure type ([place]) [A41.9, R65.20, N17.9] ?Patient Active Problem List  ? Diagnosis Date Noted  ? Sepsis secondary to UTI ([place]) 08/14/2021  ? Hypertension 08/14/2021  ? Acute renal failure superimposed on stage 3b chronic kidney disease ([place]) 08/14/2021  ? Urinary retention 08/14/2021  ? Weight loss, unintentional 04/20/2014  ? Right bundle branch block 10/14/2013  ? Sciatica neuralgia 05/02/2012  ? Hypercholesterolemia 01/18/2011  ? Benign hypertensive heart disease without heart failure 01/18/2011  ? Hx of bladder cancer 01/18/2011  ?  History of prostate cancer 01/18/2011  ? ?PCP:  Elby Showers, MD ?Pharmacy:   ?[place], [address] ?[address] ?[place] [address] ?Phone: [phone] Fax: [phone] ? ?[facility] [place] [address] ?[address] ?[place] [address] ?Phone: [phone] Fax: [phone] ? ?[id number] - [place], [address] ?[address] ?[place] [address] ?Phone: [phone] Fax: [phone] ? ? ? ? ?Social Determinants of Health (SDOH) Interventions ?  ? ?Readmission Risk Interventions ?No flowsheet data found. ? ? ?

## 2021-08-18 NOTE — Progress Notes (Signed)
Pt has not been able to urinate fully on his own.  ?Around 0130 pt denied need to urinate, 176cc was bladder scanned.  ?Around 0530 pt denied need to urinate., bladder scanned 346cc. Pt attempted to urinate on his own but only dribbled urine while walking to restroom. Bladder scanned 377cc after getting back to bed.  ? ?On call APP Olena Heckle, Np notified. In/Out cath ordered. ? ?Post in/out cath amount: 825cc with use of coude.  1st attempt with in/out cath in original packing was unsuccessful in draining urine.  ? ? ? ?

## 2021-08-18 NOTE — Progress Notes (Signed)
Peripherally Inserted Central Catheter Placement ? ?The IV Nurse has discussed with the patient and/or persons authorized to consent for the patient, the purpose of this procedure and the potential benefits and risks involved with this procedure.  The benefits include less needle sticks, lab draws from the catheter, and the patient may be discharged home with the catheter. Risks include, but not limited to, infection, bleeding, blood clot (thrombus formation), and puncture of an artery; nerve damage and irregular heartbeat and possibility to perform a PICC exchange if needed/ordered by physician.  Alternatives to this procedure were also discussed.  Bard Power PICC patient education guide, fact sheet on infection prevention and patient information card has been provided to patient /or left at bedside.   ? ?PICC Placement Documentation  ?PICC Single Lumen 89/16/94 Right Basilic 41 cm 0 cm (Active)  ?Indication for Insertion or Continuance of Line Home intravenous therapies (PICC only) 08/18/21 1506  ?Exposed Catheter (cm) 0 cm 08/18/21 1506  ?Site Assessment Clean, Dry, Intact 08/18/21 1506  ?Line Status Flushed;Saline locked;Blood return noted 08/18/21 1506  ?Dressing Type Securing device;Transparent 08/18/21 1506  ?Dressing Status Antimicrobial disc in place;Clean, Dry, Intact 08/18/21 1506  ?Safety Lock Not Applicable 50/38/88 2800  ?Dressing Change Due 08/25/21 08/18/21 1506  ? ? ? ? ? ?Shon Hale ?08/18/2021, 3:35 PM ? ?

## 2021-08-19 LAB — BASIC METABOLIC PANEL
Anion gap: 10 (ref 5–15)
BUN: 24 mg/dL — ABNORMAL HIGH (ref 8–23)
CO2: 22 mmol/L (ref 22–32)
Calcium: 8.4 mg/dL — ABNORMAL LOW (ref 8.9–10.3)
Chloride: 102 mmol/L (ref 98–111)
Creatinine, Ser: 1.61 mg/dL — ABNORMAL HIGH (ref 0.61–1.24)
GFR, Estimated: 41 mL/min — ABNORMAL LOW (ref >60)
Glucose, Bld: 95 mg/dL (ref 70–99)
Potassium: 3.8 mmol/L (ref 3.5–5.1)
Sodium: 134 mmol/L — ABNORMAL LOW (ref 135–145)

## 2021-08-19 LAB — CULTURE, BLOOD (ROUTINE X 2)
Culture: NO GROWTH
Special Requests: ADEQUATE

## 2021-08-19 MED ORDER — TRAMADOL HCL 50 MG PO TABS
25.0000 mg | ORAL_TABLET | Freq: Once | ORAL | Status: AC
  Administered 2021-08-19: 25 mg via ORAL
  Filled 2021-08-19: qty 1

## 2021-08-19 NOTE — Progress Notes (Signed)
[facility] ? ?PROGRESS NOTE ? ?Robert Bright [id number]:[id number] DOB: [birth year] DOA: 08/14/2021 ?PCP: Elby Showers, MD ? ? ?Brief HPI:   ?88 year old male with history of hypertension, hyperlipidemia, CKD stage IIIb, prostate cancer status post prostatectomy in 1993, history of urethral strictures presented to the ED with suprapubic pain and difficulty voiding.  As per patient he saw his urologist for cystoscopy of a week ago and had difficulty voiding couple of days later.  In the ED Foley catheter was placed on 2/21 and was sent home.  Patient developed suprapubic pain and catheter was not draining so came back to the ED.  CT abdomen/pelvis showed marked bladder distention with Foley tip in the prostatic urethra.  Foley cath was replaced in the ED and patient was transported to [facility] for admission.  Urology was consulted. ? ? ? ?Subjective  ? ?Patient seen and examined, now has Foley catheter placed.  Urology will follow-up as outpatient to do voiding trial. ? ? Assessment/Plan:  ? ? ? ?Sepsis due to UTI ?-Presented with suprapubic pain, found to have fever, leukocytosis and malpositioned Foley with bladder distention ?-Sepsis physiology has resolved ?-Foley was replaced in the ED and is draining ?-He was started on cefepime, 30 cc/kg LR bolus was administered ?-Blood and urine culture culture positive for MSSA, antibiotics was changed to cefazolin  ?-ID following ?-Repeat blood cultures from 08/16/2021 is negative to date ?-PICC line inserted, last day of antibiotics is 09/12/2021 ? ?Acute kidney injury on CKD stage IIIb ?-Patient's creatinine on admission was 2.66 ?-Foley catheter was not draining and causing obstruction, Foley was replaced in the ED ?-Creatinine is down to 1.61 ?-Foley catheter reinserted as patient continued to have high postvoid residuals ? ?Hypertension ?-Continue Norvasc ? ?Urinary retention ?-Due to malpositioned Foley catheter ?-Foley catheter was replaced in the  ED ?-Foley catheter was initially discontinued however was reinserted due to elevated PVR  ? ?Hematuria ?-Resolved ? ? ? ? ?Medications ? ?  ? amLODipine  2.5 mg Oral Daily  ? atorvastatin  20 mg Oral Daily  ? Chlorhexidine Gluconate Cloth  6 each Topical Daily  ? sodium chloride flush  10-40 mL Intracatheter Q12H  ? ? ? Data Reviewed:  ? ?CBG: ? ?No results for input(s): GLUCAP in the last 168 hours. ? ?SpO2: 94 %  ? ? ?Vitals:  ? 08/18/21 1927 08/19/21 0448 08/19/21 0947 08/19/21 1305  ?BP: 133/73 139/69 (!) 148/67 (!) 158/80  ?Pulse: (!) 50 (!) 42 (!) 47 63  ?Resp: 18 18  17   ?Temp: 98.3 ?F (36.8 ?C) 98.4 ?F (36.9 ?C)  97.8 ?F (36.6 ?C)  ?TempSrc: Oral Oral  Oral  ?SpO2: 99% 98%  94%  ?Weight:      ?Height:      ? ? ? ? ?Data Reviewed: ? ?Basic Metabolic Panel: ?Recent Labs  ?Lab 08/15/21 ?0932 08/16/21 ?6712 08/17/21 ?4580 08/18/21 ?9983 08/19/21 ?3825  ?NA 135 136 137 133* 134*  ?K 3.7 3.6 3.7 3.4* 3.8  ?CL 106 106 105 104 102  ?CO2 21* 23 24 22 22   ?GLUCOSE 108* 94 101* 102* 95  ?BUN 38* 34* 28* 26* 24*  ?CREATININE 2.37* 2.11* 1.98* 1.81* 1.61*  ?CALCIUM 8.3* 8.2* 8.4* 8.4* 8.4*  ? ? ?CBC: ?Recent Labs  ?Lab 08/14/21 ?1515 08/15/21 ?0539 08/16/21 ?0426 08/17/21 ?0433  ?WBC 15.1* 21.2* 15.3* 10.9*  ?NEUTROABS 14.3*  --   --   --   ?HGB 11.9* 10.6* 11.3* 12.3*  ?HCT  36.3* 32.0* 34.3* 37.1*  ?MCV 91.7 93.6 92.7 91.2  ?PLT 142* 120* 126* 153  ? ? ?LFT ?Recent Labs  ?Lab 08/14/21 ?1515  ?AST 61*  ?ALT 36  ?ALKPHOS 101  ?BILITOT 0.8  ?PROT 6.6  ?ALBUMIN 3.5  ? ?  ?Antibiotics: ?Anti-infectives (From admission, onward)  ? ? Start     Dose/Rate Route Frequency Ordered Stop  ? 08/15/21 1800  ceFAZolin (ANCEF) IVPB 2g/100 mL premix       ? 2 g ?200 mL/hr over 30 Minutes Intravenous Every 12 hours 08/15/21 1328    ? 08/14/21 1630  ceFEPIme (MAXIPIME) 2 g in sodium chloride 0.9 % 100 mL IVPB  Status:  Discontinued       ? 2 g ?200 mL/hr over 30 Minutes Intravenous Every 24 hours 08/14/21 1622 08/15/21 1328  ? 08/14/21  1615  ceFEPIme (MAXIPIME) 2 g in sodium chloride 0.9 % 100 mL IVPB  Status:  Discontinued       ? 2 g ?200 mL/hr over 30 Minutes Intravenous  Once 08/14/21 1611 08/14/21 1623  ? ?  ? ? ? ?DVT prophylaxis: SCDs ? ?Code Status: Full code ? ?Family Communication: No family at bedside ? ? ?CONSULTS urology ? ? ?Objective  ? ? ?Physical Examination: ? ? ?General-appears in no acute distress ?Heart-S1-S2, regular, no murmur auscultated ?Lungs-clear to auscultation bilaterally, no wheezing or crackles auscultated ?Abdomen-soft, nontender, no organomegaly ?Extremities-no edema in the lower extremities ?Neuro-alert, oriented x3, no focal deficit noted ? ?Status is: Inpatient: Bacteremia ? ? ? ?  ? ? ?[name] ?  ?[facility] ?If 7PM-7AM, please contact night-coverage at [email or web address], ?Office  [phone] ? ? ?08/19/2021, 3:11 PM  LOS: 5 days  ? ? ? ? ? ? ? ? ? ? ?  ?

## 2021-08-19 NOTE — TOC Progression Note (Signed)
Transition of Care (TOC) - Progression Note  ? ? ?Patient Details  ?Name: Robert Bright ?MRN: [id number] ?Date of Birth: Mar 27, 1931 ? ?Transition of Care (TOC) CM/SW Contact  ?Dessa Phi, RN ?Phone Number: ?08/19/2021, 1:37 PM ? ?Clinical Narrative:  Spoke to son Robert Bright on phone about d/c plans-home w/HHC;they will set up private caregivers on own-[employer] rep Ebony Hail to provide [facility] nursing,they also have PT if needed. Family may provide own transport home.Can arrange PTAR if needed. ? ? ? ?Expected Discharge Plan: [place] ?Barriers to Discharge: Continued Medical Work up ? ?Expected Discharge Plan and Services ?Expected Discharge Plan: [place] ?  ?Discharge Planning Services: CM Consult ?Post Acute Care Choice: Home Health ?Living arrangements for the past 2 months: [facility] ?                ?  ?  ?  ?  ?  ?  ?  ?  ?  ?  ? ? ?Social Determinants of Health (SDOH) Interventions ?  ? ?Readmission Risk Interventions ?No flowsheet data found. ? ?

## 2021-08-19 NOTE — Care Management Important Message (Signed)
Important Message ? ?Patient Details IM Letter placed in Patients room. ?Name: Robert Bright ?MRN: [id number] ?Date of Birth: 08/09/1930 ? ? ?Medicare Important Message Given:  Yes ? ? ? ? ?Kerin Salen ?08/19/2021, 1:07 PM ?

## 2021-08-19 NOTE — Plan of Care (Signed)

## 2021-08-19 NOTE — Progress Notes (Signed)
Pt reported incontinent episode. Post void Bladder scan 501cc. ?Pt denies feeling "fullness" in bladder or urge to urinate. ?Per MD order, attempt for 14 french foley placement done and unsuccessful.  ? ?Urology Charge RN notified and 2nd attempt was made, unsuccessful.  ? ?Pt did experience some trauma, minimal bleeding with small clot. Pt reports pain 7/10. ? ? ?On call APP and Urology team notified.  ?Awaiting a response. ?

## 2021-08-19 NOTE — Progress Notes (Signed)
?  Subjective: ?Pt again failed voiding trial yesterday and required catheter replacement this morning (18 Fr catheter placed by Dr. Leeroy Bock).  ? ?Objective: ?Vital signs in last 24 hours: ?Temp:  [98.3 ?F (36.8 ?C)-98.7 ?F (37.1 ?C)] 98.4 ?F (36.9 ?C) (03/03 0448) ?Pulse Rate:  [42-53] 42 (03/03 0448) ?Resp:  [18] 18 (03/03 0448) ?BP: (133-148)/(62-77) 139/69 (03/03 0448) ?SpO2:  [97 %-99 %] 98 % (03/03 0448) ? ?Intake/Output from previous day: ?03/02 0701 - 03/03 0700 ?In: 340 [P.O.:240; IV Piggyback:100] ?Out: 894 [Urine:894] ?Intake/Output this shift: ?No intake/output data recorded. ? ?Physical Exam:  ?General: Alert and oriented ?GU: Indwelling catheter in place with grossly clear urine ? ?Lab Results: ?Recent Labs  ?  08/17/21 ?9417  ?HGB 12.3*  ?HCT 37.1*  ? ?BMET ?Recent Labs  ?  08/18/21 ?0457 08/19/21 ?0313  ?NA 133* 134*  ?K 3.4* 3.8  ?CL 104 102  ?CO2 22 22  ?GLUCOSE 102* 95  ?BUN 26* 24*  ?CREATININE 1.81* 1.61*  ?CALCIUM 8.4* 8.4*  ? ? ? ?Studies/Results: ? ? ?Assessment/Plan: ?1) Urinary retention: He will need to keep catheter at this time for the next 1-2 weeks at least to allow edema to subside. Ok to get patient a leg bag when he goes home.  I will arrange outpatient follow up for a voiding trial in 1-2 weeks. ? ? LOS: 5 days  ? ?Les Alinda Money ?08/19/2021, 8:08 AM ? ?  ?

## 2021-08-19 NOTE — Progress Notes (Signed)
End of shift ? ?Pt had foley placed this morning with 800cc out at placement. ? ?Pt had a BM today and had difficulty getting out of bed and ambulating to and from the bathroom.  Dr Darrick Meigs placed a PT consult.  Spoke to son [name] today regarding his care.  Anticipate pt discharge after PT makes recommendation. ?

## 2021-08-19 NOTE — Progress Notes (Signed)
Robert Ribas, Pa returned call concerning assistance with placing foley. ?Recommendation was to walk pt and have pt attempt again to urinate on his own. If intervention not successful and pt in pain/discomfort then notify urology team. ? ?Pt later reported increased pressure/pain in bladder and requested more pain medication.  ? ?Pt was assisted to stand at bedside and with urinal pt urinated 125cc (clear yellow, small tinge of blood present).  ? ?Post void residual 519cc.  ? ?Urology team member arrived at bedside this AM and will attempt foley placement momentarily. ? ?Pt updated on plan of care, verbalized understanding. ?

## 2021-08-20 MED ORDER — FUROSEMIDE 20 MG PO TABS
20.0000 mg | ORAL_TABLET | Freq: Every day | ORAL | 11 refills | Status: DC
Start: 2021-08-20 — End: 2022-03-20

## 2021-08-20 MED ORDER — POTASSIUM CHLORIDE CRYS ER 10 MEQ PO TBCR: 10.0000 meq | EXTENDED_RELEASE_TABLET | Freq: Every day | ORAL | 2 refills | Status: DC

## 2021-08-20 MED ORDER — CEFAZOLIN IV (FOR PTA / DISCHARGE USE ONLY): 4.0000 g | INTRAVENOUS | 0 refills | Status: AC

## 2021-08-20 NOTE — Evaluation (Signed)
Physical Therapy Evaluation ?Patient Details ?Name: Robert Bright ?MRN: [id number] ?DOB: Aug 13, 1930 ?Today's Date: 08/20/2021 ? ?History of Present Illness ? 88 year old male with history of hypertension, hyperlipidemia, CKD stage IIIb, prostate cancer status post prostatectomy in 1993, history of urethral strictures presented to the ED with suprapubic pain and difficulty voiding.  As per patient he saw his urologist for cystoscopy of a week ago and had difficulty voiding couple of days later.  In the ED Foley catheter was placed on 2/21 and was sent home.  Patient developed suprapubic pain and catheter was not draining so came back to the ED.  CT abdomen/pelvis showed marked bladder distention with Foley tip in the prostatic urethra.  Foley cath was replaced in the ED and patient was transported to [facility] for admission.  Urology was consulted.  ?Clinical Impression ? Pt admitted with above diagnosis.  Pt currently with functional limitations due to the deficits listed below (see PT Problem List). Pt will benefit from skilled PT to increase their independence and safety with mobility to allow discharge to the venue listed below.  Pt with decreased balance with gait and recommend use of RW at home which he reports he has. He had some problem solving deficits such as placing his sock on his hand. Recommend 24 hours supervision and HHPT.  Nurse reports family has obtained 24 hour care. ?   ?   ? ?Recommendations for follow up therapy are one component of a multi-disciplinary discharge planning process, led by the attending physician.  Recommendations may be updated based on patient status, additional functional criteria and insurance authorization. ? ?Follow Up Recommendations Home health PT ? ?  ?Assistance Recommended at Discharge Frequent or constant Supervision/Assistance  ?Patient can return home with the following ? A lot of help with walking and/or transfers;Assistance with cooking/housework;A lot of  help with bathing/dressing/bathroom ? ?  ?Equipment Recommendations None recommended by PT (pt reports he has a RW)  ?Recommendations for Other Services ?    ?  ?Functional Status Assessment Patient has had a recent decline in their functional status and demonstrates the ability to make significant improvements in function in a reasonable and predictable amount of time.  ? ?  ?Precautions / Restrictions Precautions ?Precautions: Fall ?Restrictions ?Weight Bearing Restrictions: No  ? ?  ? ?Mobility ? Bed Mobility ?Overal bed mobility: Modified Independent ?  ?  ?  ?  ?  ?  ?General bed mobility comments: increased time ?  ? ?Transfers ?Overall transfer level: Needs assistance ?Equipment used: Rolling walker (2 wheels) ?Transfers: Sit to/from Stand ?Sit to Stand: Min guard ?  ?  ?  ?  ?  ?General transfer comment: min /guard for safety and verbal cues for hand placement. ?  ? ?Ambulation/Gait ?Ambulation/Gait assistance: Min assist, Mod assist ?Gait Distance (Feet): 175 Feet ?Assistive device: Rolling walker (2 wheels), None ?Gait Pattern/deviations: Decreased step length - right, Decreased step length - left, Trunk flexed, Decreased dorsiflexion - right, Decreased dorsiflexion - left ?  ?  ?  ?General Gait Details: Amb with RW too far forward and would respond to cues of taking larger steps for 1-2 steps then revert back.   In room ambulated without RW and unsteady. States he can feel his feet, but ambulates with a neuropathy style gait pattern at times. One episode of R LE buckeling and needed A to turn RW. ? ?Stairs ?  ?  ?  ?  ?  ? ?Wheelchair Mobility ?  ? ?  Modified Rankin (Stroke Patients Only) ?  ? ?  ? ?Balance Overall balance assessment: Needs assistance ?Sitting-balance support: No upper extremity supported ?Sitting balance-Leahy Scale: Good ?Sitting balance - Comments: able to lean forward and don socks. ?  ?  ?Standing balance-Leahy Scale: Poor ?Standing balance comment: requires UE support for dynamic  movement ?  ?  ?  ?  ?  ?  ?  ?  ?  ?  ?  ?   ? ? ? ?Pertinent Vitals/Pain Pain Assessment ?Pain Assessment: No/denies pain  ? ? ?Home Living Family/patient expects to be discharged to:: Private residence ?Living Arrangements: Alone ?Available Help at Discharge: Family ?Type of Home: House ?  ?  ?  ?  ?Home Layout: Two level;Able to live on main level with bedroom/bathroom ?Home Equipment: Conservation officer, nature (2 wheels);Cane - single point;Shower seat;BSC/3in1 ?   ?  ?Prior Function Prior Level of Function : Independent/Modified Independent ?  ?  ?  ?  ?  ?  ?  ?  ?  ? ? ?Hand Dominance  ?   ? ?  ?Extremity/Trunk Assessment  ? Upper Extremity Assessment ?Upper Extremity Assessment: Generalized weakness (B elbows red with L one a bit swollen, states that is not new) ?  ? ?Lower Extremity Assessment ?Lower Extremity Assessment: Generalized weakness ?  ? ?   ?Communication  ?    ?Cognition Arousal/Alertness: Awake/alert ?Behavior During Therapy: Flat affect ?Overall Cognitive Status: Impaired/Different from baseline ?Area of Impairment: Memory, Problem solving, Safety/judgement ?  ?  ?  ?  ?  ?  ?  ?  ?  ?  ?Memory: Decreased short-term memory ?  ?Safety/Judgement: Decreased awareness of safety, Decreased awareness of deficits ?  ?Problem Solving: Slow processing, Difficulty sequencing, Requires verbal cues, Requires tactile cues ?General Comments: When PT handed pt his sock to put on, he placed it on his hand like a mitten. Difficulty with some word finding. ?  ?  ? ?  ?General Comments General comments (skin integrity, edema, etc.): elbow redness ? ?  ?Exercises    ? ?Assessment/Plan  ?  ?PT Assessment Patient needs continued PT services  ?PT Problem List Decreased strength;Decreased activity tolerance;Decreased balance;Decreased cognition;Decreased mobility;Decreased knowledge of use of DME;Decreased safety awareness ? ?   ?  ?PT Treatment Interventions DME instruction;Gait training;Functional mobility  training;Therapeutic activities;Therapeutic exercise   ? ?PT Goals (Current goals can be found in the Care Plan section)  ?Acute Rehab PT Goals ?Patient Stated Goal: Agreeable to walk ?PT Goal Formulation: With patient ?Time For Goal Achievement: 08/27/21 ?Potential to Achieve Goals: Good ? ?  ?Frequency Min 3X/week ?  ? ? ?Co-evaluation   ?  ?  ?  ?  ? ? ?  ?AM-PAC PT "6 Clicks" Mobility  ?Outcome Measure Help needed turning from your back to your side while in a flat bed without using bedrails?: A Little ?Help needed moving from lying on your back to sitting on the side of a flat bed without using bedrails?: A Little ?Help needed moving to and from a bed to a chair (including a wheelchair)?: A Little ?Help needed standing up from a chair using your arms (e.g., wheelchair or bedside chair)?: A Little ?Help needed to walk in hospital room?: A Lot ?Help needed climbing 3-5 steps with a railing? : A Lot ?6 Click Score: 16 ? ?  ?End of Session Equipment Utilized During Treatment: Gait belt ?Activity Tolerance: Patient tolerated treatment well ?Patient left: in chair;with call bell/phone  within reach;with chair alarm set ?Nurse Communication: Mobility status ?PT Visit Diagnosis: Unsteadiness on feet (R26.81);Muscle weakness (generalized) (M62.81);Difficulty in walking, not elsewhere classified (R26.2) ?  ? ?Time: 7262-0355 ?PT Time Calculation (min) (ACUTE ONLY): 26 min ? ? ?Charges:   PT Evaluation ?$PT Eval Moderate Complexity: 1 Mod ?PT Treatments ?$Gait Training: 8-22 mins ?  ?   ? ? ?Santiago Glad L. Tamala Julian, PT ? ?08/20/2021 ? ? ? ?Galen Manila ?08/20/2021, 11:23 AM ? ?

## 2021-08-20 NOTE — TOC Transition Note (Addendum)
Transition of Care (TOC) - CM/SW Discharge Note ? ? ?Patient Details  ?Name: DENI LEFEVER ?MRN: [id number] ?Date of Birth: 03-26-1931 ? ?Transition of Care [facility]) CM/SW Contact:  ?Dessa Phi, RN ?Phone Number: ?08/20/2021, 12:22 PM ? ? ?Clinical Narrative:  Spoke to Levada Dy dtr-agree to d/c home;[employer] rep Pam has already connected patient w/home iv access;[facility] to make home visit tomorrow for teaching. Family to transport onown home. No further CM needs. ?-1:37p-Received call from dtr Angela-concerns about d/c today-encouraged her to talk to her brother [name] to coordinate d/c that is currently for today. Updated [employer] rep Pam-iv abx that is scheduled for [facility] service in the home tomorrow. Await more updated info. ?-2p-d/c home w/HHC-no further CM needs. ? ? ? ?Final next level of care: [place] ?Barriers to Discharge: No Barriers Identified ? ? ?Patient Goals and CMS Choice ?Patient states their goals for this hospitalization and ongoing recovery are:: Home ?CMS Medicare.gov Compare Post Acute Care list provided to:: Patient Represenative (must comment) [name] (son) [phone]) ?Choice offered to / list presented to : Adult Children ? ?Discharge Placement ?  ?           ?  ?  ?  ?  ? ?Discharge Plan and Services ?  ?Discharge Planning Services: CM Consult ?Post Acute Care Choice: Home Health          ?  ?  ?  ?  ?  ?HH Arranged: RN, IV Antibiotics ([employer]-HHRN iv abx;[facility]-private duty therapy;custodial level care.) ?[place] Agency: [employer] ?  ?  ?  ? ?Social Determinants of Health (SDOH) Interventions ?  ? ? ?Readmission Risk Interventions ?No flowsheet data found. ? ? ? ? ?

## 2021-08-20 NOTE — Progress Notes (Signed)
Pt found on knees by nurse tech, assisted back to chair.  MD and family notified.  Vital signs stable, skin assessment performed.  Pt stated he did not hit his head, he was in no pain.  ? 08/20/21 1542  ?What Happened  ?Was fall witnessed? No  ?Was patient injured? No  ?Patient found on floor ?(on knees)  ?Found by Staff-comment ?(Caren Griffins NT)  ?Stated prior activity ambulating-unassisted  ?Follow Up  ?MD notified [place]  ?Time MD notified 1551  ?Family notified Yes - comment  ?Time family notified 47  ?Additional tests No  ?Simple treatment  ?(assessed knees, vitals taken)  ?Adult Fall Risk Assessment  ?Risk Factor Category (scoring not indicated) Fall has occurred during this admission (document High fall risk)  ?Age 88  ?Fall History: Fall within 6 months prior to admission 5  ?Elimination; Bowel and/or Urine Incontinence 0  ?Elimination; Bowel and/or Urine Urgency/Frequency 0  ?Medications: includes PCA/Opiates, Anti-convulsants, Anti-hypertensives, Diuretics, Hypnotics, Laxatives, Sedatives, and Psychotropics 3  ?Patient Care Equipment 2  ?Mobility-Assistance 2  ?Mobility-Gait 2  ?Mobility-Sensory Deficit 0  ?Altered awareness of immediate physical environment 0  ?Impulsiveness 0  ?Lack of understanding of one's physical/cognitive limitations 4  ?Total Score 21  ?Patient Fall Risk Level High fall risk  ?Adult Fall Risk Interventions  ?Required Bundle Interventions *See Row Information* High fall risk - low, moderate, and high requirements implemented  ?Additional Interventions Use of appropriate toileting equipment (bedpan, BSC, etc.)  ?Screening for Fall Injury Risk (To be completed on HIGH fall risk patients) - Assessing Need for Floor Mats  ?Risk For Fall Injury- Criteria for Floor Mats 59 years or older  ?Will Implement Floor Mats Yes  ?Vitals  ?Temp 98.1 ?F (36.7 ?C)  ?Temp Source Oral  ?BP (!) 155/89  ?MAP (mmHg) 107  ?BP Location Left Arm  ?BP Method Automatic  ?Patient Position (if appropriate) Sitting   ?Pulse Rate 92  ?Pulse Rate Source Monitor  ?Resp 14  ?Oxygen Therapy  ?SpO2 98 %  ?O2 Device Room Air  ?Pain Assessment  ?Pain Scale 0-10  ?Pain Score 0  ?PCA/Epidural/Spinal Assessment  ?Respiratory Pattern Regular  ?Neurological  ?Neuro (WDL) WDL  ?Level of Consciousness Alert  ?Orientation Level Oriented X4  ?Cognition Follows commands  ?Speech Clear  ?Neuro Symptoms Forgetful  ?Glasgow Coma Scale  ?Eye Opening 4  ?Best Verbal Response (NON-intubated) 5  ?Best Motor Response 6  ?Glasgow Coma Scale Score 15  ?Musculoskeletal  ?Musculoskeletal (WDL) X  ?Assistive Device Standard walker  ?Generalized Weakness Yes  ?Weight Bearing Restrictions No  ?Integumentary  ?Integumentary (WDL) WDL  ?Skin Color Appropriate for ethnicity  ?Skin Condition Dry  ?Skin Integrity Erythema/redness;Ecchymosis  ?Ecchymosis Location Elbow  ?Ecchymosis Location Orientation Bilateral  ? ?

## 2021-08-20 NOTE — Plan of Care (Signed)
?  Problem: Education: ?Goal: Knowledge of General Education information will improve ?Description: Including pain rating scale, medication(s)/side effects and non-pharmacologic comfort measures ?Outcome: Adequate for Discharge ?  ?Problem: Health Behavior/Discharge Planning: ?Goal: Ability to manage health-related needs will improve ?Outcome: Adequate for Discharge ?  ?Problem: Clinical Measurements: ?Goal: Ability to maintain clinical measurements within normal limits will improve ?Outcome: Adequate for Discharge ?Goal: Will remain free from infection ?Outcome: Adequate for Discharge ?Goal: Diagnostic test results will improve ?Outcome: Adequate for Discharge ?Goal: Respiratory complications will improve ?Outcome: Adequate for Discharge ?Goal: Cardiovascular complication will be avoided ?Outcome: Adequate for Discharge ?  ?Problem: Activity: ?Goal: Risk for activity intolerance will decrease ?Outcome: Adequate for Discharge ?  ?Problem: Nutrition: ?Goal: Adequate nutrition will be maintained ?Outcome: Adequate for Discharge ?  ?Problem: Coping: ?Goal: Level of anxiety will decrease ?Outcome: Adequate for Discharge ?  ?Problem: Elimination: ?Goal: Will not experience complications related to bowel motility ?Outcome: Adequate for Discharge ?Goal: Will not experience complications related to urinary retention ?Outcome: Adequate for Discharge ?  ?Problem: Pain Managment: ?Goal: General experience of comfort will improve ?Outcome: Adequate for Discharge ?  ?Problem: Safety: ?Goal: Ability to remain free from injury will improve ?Outcome: Adequate for Discharge ?  ?Problem: Skin Integrity: ?Goal: Risk for impaired skin integrity will decrease ?Outcome: Adequate for Discharge ?  ?Problem: Fluid Volume: ?Goal: Hemodynamic stability will improve ?Outcome: Adequate for Discharge ?  ?Problem: Clinical Measurements: ?Goal: Diagnostic test results will improve ?Outcome: Adequate for Discharge ?Goal: Signs and symptoms of  infection will decrease ?Outcome: Adequate for Discharge ?  ?Problem: Respiratory: ?Goal: Ability to maintain adequate ventilation will improve ?Outcome: Adequate for Discharge ?  ?Per CM, Pt will have private [place] and PT, otherwise it would be more appropriate for a pt to go to a SNF for rehab and IV abx. ?

## 2021-08-20 NOTE — Discharge Summary (Signed)
Physician Discharge Summary   Patient: Robert Bright MRN: [id number] DOB: Dec 16, 1930  Admit date:     08/14/2021  Discharge date: 08/20/21  Discharge Physician: [name]   PCP: Elby Showers, MD   Recommendations at discharge:   Patient will be discharged on IV cefazolin to be provided by home health till 09/12/2021 Follow-up PCP in 2 weeks Follow-up urology as outpatient  Discharge Diagnoses: Principal Problem:   Sepsis secondary to UTI [facility]) Active Problems:   Hypertension   Acute renal failure superimposed on stage 3b chronic kidney disease ([place])   Urinary retention  Resolved Problems:   * No resolved hospital problems. *   Hospital Course: 88 year old male with history of hypertension, hyperlipidemia, CKD stage IIIb, prostate cancer status post prostatectomy in 1993, history of urethral strictures presented to the ED with suprapubic pain and difficulty voiding.  As per patient he saw his urologist for cystoscopy of a week ago and had difficulty voiding couple of days later.  In the ED Foley catheter was placed on 2/21 and was sent home.  Patient developed suprapubic pain and catheter was not draining so came back to the ED.  CT abdomen/pelvis showed marked bladder distention with Foley tip in the prostatic urethra.  Foley cath was replaced in the ED and patient was transported to [facility] for admission.  Urology was consulted.  Assessment and Plan:  Sepsis due to UTI -Presented with suprapubic pain, found to have fever, leukocytosis and malpositioned Foley with bladder distention -Sepsis physiology has resolved -Foley was replaced in the ED and is draining -He was started on cefepime, 30 cc/kg LR bolus was administered -Blood and urine culture culture positive for MSSA, antibiotics was changed to cefazolin  -ID following -Repeat blood cultures from 08/16/2021 is negative to date -PICC line inserted, last day of antibiotics is 09/12/2021   Acute kidney  injury on CKD stage IIIb -Patient's creatinine on admission was 2.66 -Foley catheter was not draining and causing obstruction, Foley was replaced in the ED -Creatinine is down to 1.61 -Foley catheter reinserted as patient continued to have high postvoid residuals -Follow-up urology as outpatient   Hypertension -Continue Norvasc 2.5 mg daily -We will cut down the dose of Lasix to 20 mg daily   Urinary retention -Due to malpositioned Foley catheter -Foley catheter was replaced in the ED -Foley catheter was initially discontinued however was reinserted due to elevated PVR  -Follow-up urology as outpatient for voiding trial   Hematuria -Resolved            Consultants: Infectious disease, urology Procedures performed:  Disposition: Home Diet recommendation:  Discharge Diet Orders (From admission, onward)     Start     Ordered   08/20/21 0000  Diet - low sodium heart healthy        08/20/21 1333           Cardiac diet  DISCHARGE MEDICATION: Allergies as of 08/20/2021       Reactions   Ramipril    cough        Medication List     STOP taking these medications    hydrochlorothiazide 25 MG tablet Commonly known as: HYDRODIURIL       TAKE these medications    acetaminophen 500 MG tablet Commonly known as: TYLENOL Take 1,000 mg by mouth every 6 (six) hours as needed for mild pain.   amLODipine 2.5 MG tablet Commonly known as: NORVASC Take 1 tablet (2.5 mg total) by  mouth daily.   atorvastatin 20 MG tablet Commonly known as: LIPITOR Take 1 tablet (20 mg total) by mouth daily.   ceFAZolin  IVPB Commonly known as: ANCEF Inject 4 g into the vein daily for 25 days. Indication:  MSSA Bacteremia First Dose: Yes Last Day of Therapy:  09/12/21 Labs - Once weekly:  CBC/D and BMP, Labs - Every other week:  ESR and CRP Method of administration: Elastomeric (Continuous infusion) Method of administration may be changed at the discretion of home infusion  pharmacist based upon assessment of the patient and/or caregiver's ability to self-administer the medication ordered.   furosemide 20 MG tablet Commonly known as: Lasix Take 1 tablet (20 mg total) by mouth daily. What changed:  medication strength how much to take   LORazepam 0.5 MG tablet Commonly known as: ATIVAN TAKE 1 TABLET BY MOUTH EVERY DAY AS NEEDED FOR ANXIETY What changed: See the new instructions.   melatonin 5 MG Tabs Take 5 mg by mouth at bedtime as needed (sleep).   potassium chloride 10 MEQ tablet Commonly known as: KLOR-CON M Take 1 tablet (10 mEq total) by mouth daily. What changed:  medication strength how much to take how to take this when to take this additional instructions   vitamin B-12 1000 MCG tablet Commonly known as: CYANOCOBALAMIN Take 1,000 mcg by mouth daily.               Discharge Care Instructions  (From admission, onward)           Start     Ordered   08/20/21 0000  Change dressing on IV access line weekly and PRN  (Home infusion instructions - [facility] )        08/20/21 1333            Follow-up Information     [facility] Follow up.   Why: -supplies/iv abx Contact information: [place] [phone]                Discharge Exam: Filed Weights   08/15/21 0102  Weight: 79.4 kg   General-appears in no acute distress Heart-S1-S2, regular, no murmur auscultated Lungs-clear to auscultation bilaterally, no wheezing or crackles auscultated Abdomen-soft, nontender, no organomegaly Extremities-no edema in the lower extremities Neuro-alert, oriented x3, no focal deficit noted  Condition at discharge: good  The results of significant diagnostics from this hospitalization (including imaging, microbiology, ancillary and laboratory) are listed below for reference.   Imaging Studies: CT ABDOMEN PELVIS WO CONTRAST  Result Date: 08/14/2021 CLINICAL DATA:  Suprapubic tenderness.   Dysuria for 2 days. EXAM: CT ABDOMEN AND PELVIS WITHOUT CONTRAST TECHNIQUE: Multidetector CT imaging of the abdomen and pelvis was performed following the standard protocol without IV contrast. RADIATION DOSE REDUCTION: This exam was performed according to the departmental dose-optimization program which includes automated exposure control, adjustment of the mA and/or kV according to patient size and/or use of iterative reconstruction technique. COMPARISON:  05/23/2006 FINDINGS: Lower chest: Minimal subsegmental atelectasis at the lung bases. Coronary artery calcifications. Hepatobiliary: The gallbladder is present.  No focal liver lesion. Pancreas: Unremarkable. No pancreatic ductal dilatation or surrounding inflammatory changes. Spleen: Normal in size without focal abnormality. Adrenals/Urinary Tract: Adrenal glands are normal. RIGHT kidney: There are numerous cysts throughout the RIGHT kidney, largest proximally 6.9 centimeters. Renal parenchymal thinning is present. There is RIGHT ureteropelvic junction obstruction. RIGHT ureter is normal. LEFT kidney: Numerous cysts are present. No hydronephrosis. LEFT ureter is unremarkable. Urinary bladder  is distended. The Foley balloon is within the proximal penile urethra. Numerous clips are identified LATERAL and posterior to the urinary bladder. Stomach/Bowel: Stomach is normal. Small duodenal diverticulum. Small bowel loops are normal in appearance. There is extensive colonic diverticulosis. No acute diverticulitis. Appendix is not well seen. No inflammatory changes in the RIGHT LOWER QUADRANT. Vascular/Lymphatic: There is dense atherosclerotic calcification of the abdominal aorta. No associated aneurysm. Although involved by atherosclerosis, there is vascular opacification of the celiac axis, superior mesenteric artery, and inferior mesenteric artery. Normal appearance of the portal venous system and inferior vena cava. No retroperitoneal or mesenteric adenopathy.  Reproductive: Prior prostatectomy. Other: Small fat containing paraumbilical hernia.  No ascites. Musculoskeletal: Moderate degenerative changes and scoliosis of the thoracolumbar spine. No acute abnormality. IMPRESSION: 1. Marked distension of the urinary bladder. The Foley catheter tip is within the prostatic urethra, balloon inflated in the posterior penile urethra. Recommend repositioning of Foley catheter after deflating the balloon. 2. Postoperative changes in the pelvis surrounding the urinary bladder. 3. Polycystic kidneys. RIGHT ureteropelvic junction obstruction and RIGHT renal parenchymal thinning. 4. Extensive colonic diverticulosis without acute diverticulitis. 5.  Aortic atherosclerosis.  (ICD10-I70.0) 6. Prostatectomy. These results were called by telephone at the time of interpretation on 08/14/2021 at 6:06 pm to provider [facility] , who verbally acknowledged these results. Electronically Signed   By: Nolon Nations M.D.   On: 08/14/2021 18:06   DG Chest Port 1 View  Result Date: 08/14/2021 CLINICAL DATA:  Incontinence Questionable sepsis - evaluate for abnormality EXAM: PORTABLE CHEST 1 VIEW COMPARISON:  Radiograph 08/17/2010 FINDINGS: Normal cardiac silhouette with ectatic aorta. Rounded RIGHT hilar density superimposed on the ascending thoracic aorta shadow measuring 2 cm. Atherosclerotic calcification of the aorta. No acute osseous abnormality. IMPRESSION: 1. No acute cardiopulmonary findings. 2. Rounded density at the RIGHT hilum may represent vascular shadow. Cannot exclude pulmonary nodule. Consider CT thorax without contrast Electronically Signed   By: Suzy Bouchard M.D.   On: 08/14/2021 14:39   ECHOCARDIOGRAM COMPLETE  Result Date: 08/16/2021    ECHOCARDIOGRAM REPORT   Patient Name:   DEANTRE BOURDON Date of Exam: 08/16/2021 Medical Rec #:  [id number]        Height:       70.0 in Accession #:    [id number]       Weight:       175.0 lb Date of Birth:  05-08-1931         BSA:           1.972 m Patient Age:    3 years         BP:           135/69 mmHg Patient Gender: M                HR:           66 bpm. Exam Location:  Inpatient Procedure: 2D Echo Indications:    Bacteremia  History:        Patient has prior history of Echocardiogram examinations, most                 recent 06/07/2021. Signs/Symptoms:Murmur; Risk                 Factors:Dyslipidemia and Hypertension.  Sonographer:    Arlyss Gandy Referring Phys: [place]  1. Left ventricular ejection fraction, by estimation, is 60 to 65%. The left ventricle has normal function. The left ventricle has no regional  wall motion abnormalities. There is mild concentric left ventricular hypertrophy. Left ventricular diastolic parameters are consistent with Grade I diastolic dysfunction (impaired relaxation).  2. Right ventricular systolic function is normal. The right ventricular size is normal. There is mildly elevated pulmonary artery systolic pressure.  3. The mitral valve is normal in structure. Mild mitral valve regurgitation. No evidence of mitral stenosis.  4. The aortic valve is tricuspid. Aortic valve regurgitation is mild to moderate. Aortic valve sclerosis is present, with no evidence of aortic valve stenosis.  5. The inferior vena cava is dilated in size with >50% respiratory variability, suggesting right atrial pressure of 8 mmHg. Comparison(s): No significant change from prior study. FINDINGS  Left Ventricle: Left ventricular ejection fraction, by estimation, is 60 to 65%. The left ventricle has normal function. The left ventricle has no regional wall motion abnormalities. The left ventricular internal cavity size was normal in size. There is  mild concentric left ventricular hypertrophy. Left ventricular diastolic parameters are consistent with Grade I diastolic dysfunction (impaired relaxation). Right Ventricle: The right ventricular size is normal. No increase in right ventricular wall thickness. Right  ventricular systolic function is normal. There is mildly elevated pulmonary artery systolic pressure. The tricuspid regurgitant velocity is 3.01  m/s, and with an assumed right atrial pressure of 8 mmHg, the estimated right ventricular systolic pressure is 04.5 mmHg. Left Atrium: Left atrial size was normal in size. Right Atrium: Right atrial size was normal in size. Pericardium: There is no evidence of pericardial effusion. Mitral Valve: The mitral valve is normal in structure. Mild mitral valve regurgitation. No evidence of mitral valve stenosis. Tricuspid Valve: The tricuspid valve is normal in structure. Tricuspid valve regurgitation is mild . No evidence of tricuspid stenosis. Aortic Valve: The aortic valve is tricuspid. Aortic valve regurgitation is mild to moderate. Aortic regurgitation PHT measures 544 msec. Aortic valve sclerosis is present, with no evidence of aortic valve stenosis. Aortic valve mean gradient measures 7.0  mmHg. Aortic valve peak gradient measures 12.7 mmHg. Aortic valve area, by VTI measures 2.10 cm. Pulmonic Valve: The pulmonic valve was normal in structure. Pulmonic valve regurgitation is trivial. Aorta: The aortic root and ascending aorta are structurally normal, with no evidence of dilitation. Venous: The inferior vena cava is dilated in size with greater than 50% respiratory variability, suggesting right atrial pressure of 8 mmHg. IAS/Shunts: No atrial level shunt detected by color flow Doppler.  LEFT VENTRICLE PLAX 2D LVIDd:         4.30 cm   Diastology LVIDs:         2.80 cm   LV e' medial:    7.72 cm/s LV PW:         1.10 cm   LV E/e' medial:  11.3 LV IVS:        1.10 cm   LV e' lateral:   9.79 cm/s LVOT diam:     2.00 cm   LV E/e' lateral: 8.9 LV SV:         75 LV SV Index:   38 LVOT Area:     3.14 cm  RIGHT VENTRICLE             IVC RV Basal diam:  3.10 cm     IVC diam: 2.20 cm RV Mid diam:    2.90 cm RV S prime:     14.30 cm/s TAPSE (M-mode): 2.0 cm LEFT ATRIUM              Index  RIGHT ATRIUM           Index LA diam:        3.20 cm 1.62 cm/m   RA Area:     14.50 cm LA Vol (A2C):   61.3 ml 31.08 ml/m  RA Volume:   31.20 ml  15.82 ml/m LA Vol (A4C):   39.5 ml 20.03 ml/m LA Biplane Vol: 50.3 ml 25.50 ml/m  AORTIC VALVE AV Area (Vmax):    2.28 cm AV Area (Vmean):   2.18 cm AV Area (VTI):     2.10 cm AV Vmax:           178.00 cm/s AV Vmean:          121.000 cm/s AV VTI:            0.357 m AV Peak Grad:      12.7 mmHg AV Mean Grad:      7.0 mmHg LVOT Vmax:         129.00 cm/s LVOT Vmean:        83.800 cm/s LVOT VTI:          0.239 m LVOT/AV VTI ratio: 0.67 AI PHT:            544 msec  AORTA Ao Root diam: 3.40 cm Ao Asc diam:  3.30 cm MITRAL VALVE               TRICUSPID VALVE MV Area (PHT): 3.48 cm    TR Peak grad:   36.2 mmHg MV Decel Time: 218 msec    TR Vmax:        301.00 cm/s MV E velocity: 87.40 cm/s MV A velocity: 81.40 cm/s  SHUNTS MV E/A ratio:  1.07        Systemic VTI:  0.24 m                            Systemic Diam: 2.00 cm Rudean Haskell MD Electronically signed by Rudean Haskell MD Signature Date/Time: 08/16/2021/3:51:04 PM    Final    [place] EKG SITE RITE  Result Date: 08/18/2021 If Site Rite image not attached, placement could not be confirmed due to current cardiac rhythm.   Microbiology: Results for orders placed or performed during the hospital encounter of 08/14/21  Blood Culture (routine x 2)     Status: Abnormal   Collection Time: 08/14/21  3:15 PM   Specimen: BLOOD  Result Value Ref Range Status   Specimen Description   Final    BLOOD BLOOD RIGHT ARM Performed at [facility], [address], [place], [place] [address]    Special Requests   Final    BOTTLES DRAWN AEROBIC AND ANAEROBIC Blood Culture adequate volume Performed at [facility], [place], [place] [address]    Culture  Setup Time   Final    GRAM POSITIVE COCCI IN CLUSTERS IN BOTH AEROBIC AND ANAEROBIC  BOTTLES CRITICAL RESULT CALLED TO, READ BACK BY AND VERIFIED WITH: PHARMD N.JOOJOVAC AT 1250 ON 08/15/2021 BY T.SAAD. Performed at [facility], [place] [address], [place], [place] [address]    Culture STAPHYLOCOCCUS AUREUS (A)  Final   Report Status 08/17/2021 FINAL  Final   Organism ID, Bacteria STAPHYLOCOCCUS AUREUS  Final      Susceptibility   Staphylococcus aureus - MIC*    CIPROFLOXACIN <=0.5 SENSITIVE Sensitive     ERYTHROMYCIN >=8 RESISTANT Resistant  GENTAMICIN <=0.5 SENSITIVE Sensitive     OXACILLIN <=0.25 SENSITIVE Sensitive     TETRACYCLINE <=1 SENSITIVE Sensitive     VANCOMYCIN <=0.5 SENSITIVE Sensitive     TRIMETH/SULFA <=10 SENSITIVE Sensitive     CLINDAMYCIN RESISTANT Resistant     RIFAMPIN <=0.5 SENSITIVE Sensitive     Inducible Clindamycin POSITIVE Resistant     * STAPHYLOCOCCUS AUREUS  Urine Culture     Status: Abnormal   Collection Time: 08/14/21  3:15 PM   Specimen: In/Out Cath Urine  Result Value Ref Range Status   Specimen Description   Final    IN/OUT CATH URINE Performed at [facility], [address] [place], [place] [address]    Special Requests   Final    NONE Performed at [facility], [address] [place], [place] [address]    Culture >=100,000 COLONIES/mL STAPHYLOCOCCUS AUREUS (A)  Final   Report Status 08/17/2021 FINAL  Final   Organism ID, Bacteria STAPHYLOCOCCUS AUREUS (A)  Final      Susceptibility   Staphylococcus aureus - MIC*    CIPROFLOXACIN <=0.5 SENSITIVE Sensitive     GENTAMICIN <=0.5 SENSITIVE Sensitive     NITROFURANTOIN <=16 SENSITIVE Sensitive     OXACILLIN <=0.25 SENSITIVE Sensitive     TETRACYCLINE <=1 SENSITIVE Sensitive     VANCOMYCIN 1 SENSITIVE Sensitive     TRIMETH/SULFA <=10 SENSITIVE Sensitive     CLINDAMYCIN RESISTANT Resistant     RIFAMPIN <=0.5 SENSITIVE Sensitive     Inducible Clindamycin POSITIVE Resistant     * >=100,000 COLONIES/mL STAPHYLOCOCCUS AUREUS   Resp Panel by RT-PCR (Flu A&B, Covid)     Status: None   Collection Time: 08/14/21  3:15 PM   Specimen: Nasopharyngeal(NP) swabs in vial transport medium  Result Value Ref Range Status   SARS Coronavirus 2 by RT PCR NEGATIVE NEGATIVE Final    Comment: (NOTE) SARS-CoV-2 target nucleic acids are NOT DETECTED.  The SARS-CoV-2 RNA is generally detectable in upper respiratory specimens during the acute phase of infection. The lowest concentration of SARS-CoV-2 viral copies this assay can detect is 138 copies/mL. A negative result does not preclude SARS-Cov-2 infection and should not be used as the sole basis for treatment or other patient management decisions. A negative result may occur with  improper specimen collection/handling, submission of specimen other than nasopharyngeal swab, presence of viral mutation(s) within the areas targeted by this assay, and inadequate number of viral copies(<138 copies/mL). A negative result must be combined with clinical observations, patient history, and epidemiological information. The expected result is Negative.  Fact Sheet for Patients:  EntrepreneurPulse.com.au  Fact Sheet for Healthcare Providers:  IncredibleEmployment.be  This test is no t yet approved or cleared by the [place] FDA and  has been authorized for detection and/or diagnosis of SARS-CoV-2 by FDA under an Emergency Use Authorization (EUA). This EUA will remain  in effect (meaning this test can be used) for the duration of the COVID-19 declaration under Section 564(b)(1) of the Act, 21 U.S.C.section 360bbb-3(b)(1), unless the authorization is terminated  or revoked sooner.       Influenza A by PCR NEGATIVE NEGATIVE Final   Influenza B by PCR NEGATIVE NEGATIVE Final    Comment: (NOTE) The Xpert Xpress SARS-CoV-2/FLU/RSV plus assay is intended as an aid in the diagnosis of influenza from Nasopharyngeal swab specimens and should not be  used as a sole basis for treatment. Nasal washings and aspirates are unacceptable for Xpert Xpress SARS-CoV-2/FLU/RSV testing.  Fact Sheet for Patients: EntrepreneurPulse.com.au  Fact Sheet for Healthcare Providers: IncredibleEmployment.be  This test is not yet approved or cleared by the [place] FDA and has been authorized for detection and/or diagnosis of SARS-CoV-2 by FDA under an Emergency Use Authorization (EUA). This EUA will remain in effect (meaning this test can be used) for the duration of the COVID-19 declaration under Section 564(b)(1) of the Act, 21 U.S.C. section 360bbb-3(b)(1), unless the authorization is terminated or revoked.  Performed at [facility], [address], [place], [place] [address]   Blood Culture ID Panel (Reflexed)     Status: Abnormal   Collection Time: 08/14/21  3:15 PM  Result Value Ref Range Status   Enterococcus faecalis NOT DETECTED NOT DETECTED Final   Enterococcus Faecium NOT DETECTED NOT DETECTED Final   Listeria monocytogenes NOT DETECTED NOT DETECTED Final   Staphylococcus species DETECTED (A) NOT DETECTED Final    Comment: CRITICAL RESULT CALLED TO, READ BACK BY AND VERIFIED WITH: PHARMD N.JOOJOVAC AT 1250 ON 08/15/2021 BY T.SAAD.    Staphylococcus aureus (BCID) DETECTED (A) NOT DETECTED Final    Comment: CRITICAL RESULT CALLED TO, READ BACK BY AND VERIFIED WITH: PHARMD N.JOOJOVAC AT 1250 ON 08/15/2021 BY T.SAAD.    Staphylococcus epidermidis NOT DETECTED NOT DETECTED Final   Staphylococcus lugdunensis NOT DETECTED NOT DETECTED Final   Streptococcus species NOT DETECTED NOT DETECTED Final   Streptococcus agalactiae NOT DETECTED NOT DETECTED Final   Streptococcus pneumoniae NOT DETECTED NOT DETECTED Final   Streptococcus pyogenes NOT DETECTED NOT DETECTED Final   A.calcoaceticus-baumannii NOT DETECTED NOT DETECTED Final   Bacteroides fragilis NOT DETECTED NOT DETECTED  Final   Enterobacterales NOT DETECTED NOT DETECTED Final   Enterobacter cloacae complex NOT DETECTED NOT DETECTED Final   Escherichia coli NOT DETECTED NOT DETECTED Final   Klebsiella aerogenes NOT DETECTED NOT DETECTED Final   Klebsiella oxytoca NOT DETECTED NOT DETECTED Final   Klebsiella pneumoniae NOT DETECTED NOT DETECTED Final   Proteus species NOT DETECTED NOT DETECTED Final   Salmonella species NOT DETECTED NOT DETECTED Final   Serratia marcescens NOT DETECTED NOT DETECTED Final   Haemophilus influenzae NOT DETECTED NOT DETECTED Final   Neisseria meningitidis NOT DETECTED NOT DETECTED Final   Pseudomonas aeruginosa NOT DETECTED NOT DETECTED Final   Stenotrophomonas maltophilia NOT DETECTED NOT DETECTED Final   Candida albicans NOT DETECTED NOT DETECTED Final   Candida auris NOT DETECTED NOT DETECTED Final   Candida glabrata NOT DETECTED NOT DETECTED Final   Candida krusei NOT DETECTED NOT DETECTED Final   Candida parapsilosis NOT DETECTED NOT DETECTED Final   Candida tropicalis NOT DETECTED NOT DETECTED Final   Cryptococcus neoformans/gattii NOT DETECTED NOT DETECTED Final   Meth resistant mecA/C and MREJ NOT DETECTED NOT DETECTED Final    Comment: Performed at [facility], [address] [place], [place] [address]  Blood Culture (routine x 2)     Status: None   Collection Time: 08/14/21  5:48 PM   Specimen: BLOOD  Result Value Ref Range Status   Specimen Description   Final    BLOOD LEFT ANTECUBITAL Performed at [facility], [address] [place], [place] [address]    Special Requests   Final    BOTTLES DRAWN AEROBIC AND ANAEROBIC Blood Culture adequate volume Performed at [facility], [address] [place], [place] [address]    Culture   Final    NO GROWTH 5 DAYS Performed at [facility],  [address] [place], [place] [address]    Report Status 08/19/2021 FINAL  Final  Culture, blood (routine x 2)      Status: None (Preliminary result)   Collection Time: 08/16/21  9:17 AM   Specimen: BLOOD  Result Value Ref Range Status   Specimen Description   Final    BLOOD RIGHT ANTECUBITAL Performed at [facility] [address], [place], [place] [address]    Special Requests   Final    BOTTLES DRAWN AEROBIC AND ANAEROBIC Blood Culture adequate volume Performed at [facility] [address] [place], [place] [address]    Culture   Final    NO GROWTH 4 DAYS Performed at [facility], [place] [address], [place], [place] [address]    Report Status PENDING  Incomplete  Culture, blood (routine x 2)     Status: None (Preliminary result)   Collection Time: 08/16/21  9:17 AM   Specimen: BLOOD  Result Value Ref Range Status   Specimen Description   Final    BLOOD LEFT ANTECUBITAL Performed at [facility] [address], [place], [place] [address]    Special Requests   Final    BOTTLES DRAWN AEROBIC AND ANAEROBIC Blood Culture adequate volume Performed at [facility] [address] [address], [place],  [address]    Culture   Final    NO GROWTH 4 DAYS Performed at [facility], [place] [address], [place],  [address]    Report Status PENDING  Incomplete    Labs: CBC: Recent Labs  Lab 08/14/21 1515 08/15/21 0437 08/16/21 0426 08/17/21 0433  WBC 15.1* 21.2* 15.3* 10.9*  NEUTROABS 14.3*  --   --   --   HGB 11.9* 10.6* 11.3* 12.3*  HCT 36.3* 32.0* 34.3* 37.1*  MCV 91.7 93.6 92.7 91.2  PLT 142* 120* 126* 672   Basic Metabolic Panel: Recent Labs  Lab 08/15/21 0437 08/16/21 0426 08/17/21 0433 08/18/21 0457 08/19/21 0313  NA 135 136 137 133* 134*  K 3.7 3.6 3.7 3.4* 3.8  CL 106 106 105 104 102  CO2 21* '23 24 22 22  ' GLUCOSE 108* 94 101* 102* 95  BUN 38* 34* 28* 26* 24*  CREATININE 2.37* 2.11* 1.98* 1.81* 1.61*  CALCIUM 8.3* 8.2* 8.4* 8.4* 8.4*   Liver Function Tests: Recent Labs  Lab  08/14/21 1515  AST 61*  ALT 36  ALKPHOS 101  BILITOT 0.8  PROT 6.6  ALBUMIN 3.5   CBG: No results for input(s): GLUCAP in the last 168 hours.  Discharge time spent: greater than 30 minutes.  Signed: [name], MD [facility] 08/20/2021

## 2021-08-21 LAB — CULTURE, BLOOD (ROUTINE X 2)
Culture: NO GROWTH
Culture: NO GROWTH
Special Requests: ADEQUATE
Special Requests: ADEQUATE

## 2021-08-23 ENCOUNTER — Telehealth: Payer: Self-pay

## 2021-08-23 NOTE — Telephone Encounter (Signed)
Transition Care Management Follow-up Telephone Call ?Date of discharge and from where: [facility] 3/4 ?How have you been since you were released from the hospital? Doing better every day ?Any questions or concerns? No ? ?Items Reviewed: ?Did the pt receive and understand the discharge instructions provided? Yes  ?Medications obtained and verified? Yes  ?Other?    ?Any new allergies since your discharge? No  ?Dietary orders reviewed? Yes ?Do you have support at home? Yes  ? ?Home Care and Equipment/Supplies: ?Were home health services ordered? yes ?If so, what is the name of the agency? [facility] ordered by family  ?Has the agency set up a time to come to the patient's home? yes ?Were any new equipment or medical supplies ordered?  Yes: foley cathetor, IV antibiotics ?What is the name of the medical supply agency?  ?Were you able to get the supplies/equipment? yes ?Do you have any questions related to the use of the equipment or supplies? No ? ?Functional Questionnaire: (I = Independent and D = Dependent) ?ADLs: D ? ?Bathing/Dressing- D ? ?Meal Prep- D ? ?Eating- I ? ?Maintaining continence- foley-I ? ?Transferring/Ambulation- D ? ?Managing Meds- D ? ?Follow up appointments reviewed: ? ?PCP Hospital f/u appt confirmed? Yes  Scheduled to see DrBaxley on 3/9 @ 12. ?[facility] f/u appt confirmed? Yes  Scheduled to see Infectious disease on 3/223 and also has a pending appt with Dr Katharina Caper Cardiology not made yet. [name] ?Are transportation arrangements needed? No  ?If their condition worsens, is the pt aware to call PCP or go to the Emergency Dept.? Yes ?Was the patient provided with contact information for the PCP's office or ED? Yes ?Was to pt encouraged to call back with questions or concerns? Yes  ?

## 2021-08-25 ENCOUNTER — Encounter: Payer: Self-pay | Admitting: Internal Medicine

## 2021-08-25 ENCOUNTER — Ambulatory Visit: Payer: Medicare Other | Admitting: Internal Medicine

## 2021-08-25 ENCOUNTER — Other Ambulatory Visit: Payer: Self-pay

## 2021-08-25 VITALS — BP 148/84 | HR 58 | Temp 97.9°F

## 2021-08-25 DIAGNOSIS — N390 Urinary tract infection, site not specified: Secondary | ICD-10-CM

## 2021-08-25 DIAGNOSIS — Z8551 Personal history of malignant neoplasm of bladder: Secondary | ICD-10-CM

## 2021-08-25 DIAGNOSIS — Z8546 Personal history of malignant neoplasm of prostate: Secondary | ICD-10-CM | POA: Diagnosis not present

## 2021-08-25 DIAGNOSIS — E7800 Pure hypercholesterolemia, unspecified: Secondary | ICD-10-CM

## 2021-08-25 DIAGNOSIS — I10 Essential (primary) hypertension: Secondary | ICD-10-CM

## 2021-08-25 DIAGNOSIS — R5383 Other fatigue: Secondary | ICD-10-CM

## 2021-08-25 DIAGNOSIS — N1831 Chronic kidney disease, stage 3a: Secondary | ICD-10-CM

## 2021-08-25 DIAGNOSIS — A419 Sepsis, unspecified organism: Secondary | ICD-10-CM | POA: Diagnosis not present

## 2021-08-25 NOTE — Progress Notes (Signed)
? ?Subjective:  ? ? Patient ID: Robert Bright, male    DOB: [birth year], 88 y.o.   MRN: [id number] ? ?HPI 88 year old Male seen for hospitalization follow-up.  [facility] with suprapubic pain and difficulty voiding.  It was noted at that time that he was last able to void around 2 PM and presented to the emergency department around 9 PM.  He was complaining of suprapubic pressure.  He had large volume on bladder scan and Foley catheter was placed.  His white count was normal at the time at 9600.  His urine was basically negative except for trace hemoglobin on urine dipstick.  Small amount of protein.  No LE or nitrite.  No white cells. ? ?Subsequently presented to the emergency department on February 26 at [facility] with suprapubic pain.  White blood cell count was 15,100.  Lactic acid was 2.2.  Creatinine was 2.66.  Potassium was 3.4.  CO2 was 19.  SGOT was 61.  BUN was 41.  He was hospitalized and urine culture grew greater than 100,000 CLL/mL Staphylococcus aureus.  He was initially started on cefepime.  After results of urine culture obtained, antibiotics were changed to cefazolin.  Creatinine improved to 1.61.  Foley catheter remain due to high postvoid residuals.  His Lasix was decreased to 20 mg daily and he was continued on Norvasc 2.5 mg daily. ? ? ? ?  He had seen [name] on February 17 for follow-up on bladder and prostate cancer.  He also had surveillance cystoscopy.  He has a history of low-grade urothelial carcinoma of the bladder.  His small bladder tumor was fulgurated on that day and he was advised to follow-up in 6 months.  He has a history of radical retropubic prostatectomy by Dr. Laurence Ferrari in 1993.  His PSA thus far has been undetectable.   He has history of bladder neck contracture and urethral stricture.  This is thought to be due to his prostatectomy.  He underwent balloon dilatation of his bladder neck in April 2009.  He required a  repeat dilatation in February 2010.  He has been able to tolerate dilatation with filiform and followers in the office. ? ?Last year he lost his wife due to complications of dementia. ?  ?Currently receiving home health services and is on Ancef one gram daily until March 28 according to his daughter. ? ?He reports that his appetite is fairly good.  He is in no pain.  His spirits are good. ? ? ?Review of Systems no fever, chills, nausea or vomiting.  No suprapubic discomfort. ? ?   ?Objective:  ? Physical Exam ?His blood pressure is 148/84 pulse 58 temperature 97.9 degrees by tympanic thermometer and pulse oximetry is 99% ?Skin warm and dry.  Neck is supple without JVD thyromegaly or carotid bruits.  Chest is clear to auscultation.  Cardiac exam: Regular rate and rhythm without ectopy.  No lower extremity pitting edema.  His affect is normal. ? ? ?   ?Assessment & Plan:  ?Staphylococcus aureus septicemia ? ?History of bladder cancer and prostate cancer followed by [name]. ? ?Urinary retention causing septicemia. ? ?History of hypertension-currently on amlodipine 2.5 mg daily.  Also takes Lasix 20 mg daily and Klor-Con 10 milliequivalents daily. ? ?History of hyperlipidemia currently on Lipitor 20 mg daily ? ?Plan: We have been in contact with [facility] and they will be scheduling a follow-up appointment  very  soon.  He will continue with home health services Ancef IV daily as prescribed by Hospitalist.  He has Infectious Disease clinic follow-up scheduled for March 23. ? ?He has appointment here for health maintenance exam April 06, 2022. ? ?Today we drew CBC and c-Met.  These results are pending. ? ?

## 2021-08-25 NOTE — Patient Instructions (Addendum)
CBC with diff and C-met drawn today. Finish daily antibiotic therapy per home health as ordered. Has follow up later this month at Infectious Disease Clinic.  Dr. Lynne Logan Note indicates patient will need to be seen by [name] for voiding trial and follow up.  [facility] will be in contact with patient.  Currently has Foley in place. ?

## 2021-08-26 LAB — COMPLETE METABOLIC PANEL WITH GFR
AG Ratio: 1.1 (calc) (ref 1.0–2.5)
ALT: <3 U/L — ABNORMAL LOW (ref 9–46)
AST: 22 U/L (ref 10–35)
Albumin: 3.5 g/dL — ABNORMAL LOW (ref 3.6–5.1)
Alkaline phosphatase (APISO): 80 U/L (ref 35–144)
BUN/Creatinine Ratio: 16 (calc) (ref 6–22)
BUN: 29 mg/dL — ABNORMAL HIGH (ref 7–25)
CO2: 20 mmol/L (ref 20–32)
Calcium: 9 mg/dL (ref 8.6–10.3)
Chloride: 105 mmol/L (ref 98–110)
Creat: 1.77 mg/dL — ABNORMAL HIGH (ref 0.70–1.22)
Globulin: 3.3 g/dL (calc) (ref 1.9–3.7)
Glucose, Bld: 89 mg/dL (ref 65–99)
Potassium: 4.5 mmol/L (ref 3.5–5.3)
Sodium: 137 mmol/L (ref 135–146)
Total Bilirubin: 0.3 mg/dL (ref 0.2–1.2)
Total Protein: 6.8 g/dL (ref 6.1–8.1)
eGFR: 36 mL/min/{1.73_m2} — ABNORMAL LOW (ref >=60)

## 2021-08-26 LAB — CBC WITH DIFFERENTIAL/PLATELET
Absolute Monocytes: 669 cells/uL (ref 200–950)
Basophils Absolute: 58 cells/uL (ref 0–200)
Basophils Relative: 0.6 %
Eosinophils Absolute: 252 cells/uL (ref 15–500)
Eosinophils Relative: 2.6 %
HCT: 37.2 % — ABNORMAL LOW (ref 38.5–50.0)
Hemoglobin: 12.3 g/dL — ABNORMAL LOW (ref 13.2–17.1)
Lymphs Abs: 1513 cells/uL (ref 850–3900)
MCH: 30 pg (ref 27.0–33.0)
MCHC: 33.1 g/dL (ref 32.0–36.0)
MCV: 90.7 fL (ref 80.0–100.0)
MPV: 11.3 fL (ref 7.5–12.5)
Monocytes Relative: 6.9 %
Neutro Abs: 7207 cells/uL (ref 1500–7800)
Neutrophils Relative %: 74.3 %
Platelets: 353 10*3/uL (ref 140–400)
RBC: 4.1 10*6/uL — ABNORMAL LOW (ref 4.20–5.80)
RDW: 12.7 % (ref 11.0–15.0)
Total Lymphocyte: 15.6 %
WBC: 9.7 10*3/uL (ref 3.8–10.8)

## 2021-08-26 LAB — TSH: TSH: 1.58 mIU/L (ref 0.40–4.50)

## 2021-08-29 ENCOUNTER — Telehealth: Payer: Self-pay | Admitting: Internal Medicine

## 2021-08-29 ENCOUNTER — Encounter: Payer: Self-pay | Admitting: Internal Medicine

## 2021-08-29 ENCOUNTER — Other Ambulatory Visit: Payer: Self-pay

## 2021-08-29 ENCOUNTER — Telehealth (INDEPENDENT_AMBULATORY_CARE_PROVIDER_SITE_OTHER): Payer: Medicare Other | Admitting: Internal Medicine

## 2021-08-29 VITALS — BP 114/56 | Temp 98.1°F

## 2021-08-29 DIAGNOSIS — Z8546 Personal history of malignant neoplasm of prostate: Secondary | ICD-10-CM | POA: Diagnosis not present

## 2021-08-29 DIAGNOSIS — K521 Toxic gastroenteritis and colitis: Secondary | ICD-10-CM | POA: Diagnosis not present

## 2021-08-29 DIAGNOSIS — Z8551 Personal history of malignant neoplasm of bladder: Secondary | ICD-10-CM

## 2021-08-29 DIAGNOSIS — R339 Retention of urine, unspecified: Secondary | ICD-10-CM

## 2021-08-29 DIAGNOSIS — A4101 Sepsis due to Methicillin susceptible Staphylococcus aureus: Secondary | ICD-10-CM

## 2021-08-29 DIAGNOSIS — M25422 Effusion, left elbow: Secondary | ICD-10-CM

## 2021-08-29 DIAGNOSIS — N390 Urinary tract infection, site not specified: Secondary | ICD-10-CM

## 2021-08-29 DIAGNOSIS — A419 Sepsis, unspecified organism: Secondary | ICD-10-CM

## 2021-08-29 DIAGNOSIS — M25421 Effusion, right elbow: Secondary | ICD-10-CM

## 2021-08-29 MED ORDER — ONDANSETRON HCL 4 MG PO TABS: 4.0000 mg | ORAL_TABLET | Freq: Three times a day (TID) | ORAL | 0 refills | Status: DC | PRN

## 2021-08-29 NOTE — Telephone Encounter (Signed)
Spoke with patient's daughter, offered appointment for tomorrow or Thursday. [name] will not be in [place] and will get in touch with her sister to see when she can bring the patient to an appointment and call us back. ? ?Beryle Flock, RN ? ?

## 2021-08-29 NOTE — Patient Instructions (Addendum)
May give Imodium A-D once or twice daily for diarrhea.  Monitor elbow effusions and call if they are worsening.  Consider checking for C. difficile diarrhea.  Has appointment with Infectious Disease clinic March 23. ?

## 2021-08-29 NOTE — Telephone Encounter (Signed)
scheduled

## 2021-08-29 NOTE — Progress Notes (Signed)
? ?Subjective:  ? ? Patient ID: Robert Bright, male    DOB: 1930/11/23, 88 y.o.   MRN: [id number] ? ?HPI 88 year old Male seen today via interactive audio and video telecommunications due to the Coronavirus pandemic.  He is identified using 2 identifiers as Robert Bright, a longstanding patient in this practice.  He is at his home and I am at my office.  He is agreeable to visit in this format today.  His daughter is present with him today. ? ?Patient was seen at [facility] urology by [name] on February 17 for surveillance of bladder and prostate cancer.  He underwent a radical retropubic prostatectomy by Dr. Laurence Ferrari in 1993.  He has had fulguration of several small papillary bladder tumors between 2013 and 2017.  History of bladder neck contracture likely related to prostatectomy.  He underwent balloon dilatation of his bladder neck in April 2009.  He required repeat dilatation in February 2010.  Had small membranous urethral stricture in March 2013 requiring dilatation at that time and intermittently since then. ? ?Patient presented with urinary retention to [facility] on February 21 and a Foley catheter was placed.  His urine was basically negative except for trace hemoglobin on urine dipstick.  Had no LE ,nitrite, or white cells on urine specimen.  He had large volume on bladder scan. ? ?He subsequently presented to the Emergency Department on February 26 at [facility] with suprapubic pain.  White count was 15,100.  Lactic acid was 2.2.  Creatinine was 2.66.  He was hospitalized and urine culture grew greater than 100,000 Col/ml Staph aureus.  He was initially started on cefepime and was subsequently changed to cefazolin.  Creatinine improved to 1.61.  He was seen here in follow-up on March 9 and his creatinine was 1.77.  He has appointment at Infectious Disease Clinic on March 23. ? ?  He has also developed diarrhea.  Patient says he has 2 large loose stools per day.  He has no fever.   Home health nurse is asking about Imodium A-D which I think is okay to try.  We may need to check for C. difficile.  ? ? Also apparently has developed what sounds like effusion or bursitis on his right elbow.  Daughter tells me there is a similar area on the left elbow.  Family first noticed swelling on both elbows when he came home from the hospital.  These 2 areas do appear to be painful. These areas may be a little warm according to what daughter says but not red  or hot to touch. I do not know if this is a olecranon bursitis. These are non-tender apparently.  He may need to see orthopedist if not improving.  No prior history of gout to my knowledge. Cuurently no documented fever or chills, No nausea or vomiting. ? ? ? ? ? ?Review of Systems see above ? ?   ?Objective:  ? Physical Exam ?Patient is seen lying in bed and is able to speak to me during the virtual visit.  He is able to tell me he is having 2 large bowel movements daily.  Not complaining of pain in his elbows. ? ? ? ?   ?Assessment & Plan:  ??  Elbow effusions-I think we can observe these as long as they are not hot and tender, and he is not running fever.  Apparently had these prior to coming home from the hospital according to daughter. ? ?Diarrhea-?  Related to antibiotic therapy.  Consider C. difficile diarrhea.  May take 1-2 doses of Imodium AD daily.  Consider checking for C. difficile infection. Notes from hospitalization indicate he will likely be on antibiotics until around March 28 ? ?Urinary retention due to urethral stricture- has appointment with Dr. Gwen Pounds soon. ? ?Staph aureus septicemia- currently being treated with Ancef 4 grams IV daily to complete a 25 day course per ID recommendations ? ? Remote Hx of radical prostatectomy for prostate carcinoma ? ?Hx of bladder cancer followed by [name] ? ?Time spent with extensive chart review, interviewing patient and medical decision making is 30 minutes ? ?

## 2021-08-29 NOTE — Telephone Encounter (Signed)
[place] ?[phone] ? ?Faith called to Robert Bright has knot on his elbow that is the size of a golf ball, may be bursitis, she would like some orders to treat it. Daughter said this has been there at least since last Tuesday. ?He also has had diarrhea for couple of days, can he have imodium or Pepto   ?

## 2021-08-30 ENCOUNTER — Encounter: Payer: Self-pay | Admitting: Internal Medicine

## 2021-08-30 ENCOUNTER — Ambulatory Visit (INDEPENDENT_AMBULATORY_CARE_PROVIDER_SITE_OTHER): Payer: Medicare Other | Admitting: Internal Medicine

## 2021-08-30 ENCOUNTER — Other Ambulatory Visit: Payer: Self-pay

## 2021-08-30 VITALS — BP 124/77 | HR 68 | Temp 98.2°F

## 2021-08-30 DIAGNOSIS — R197 Diarrhea, unspecified: Secondary | ICD-10-CM

## 2021-08-30 NOTE — Patient Instructions (Signed)
C. diff Collection  - Red Container ? ?Place the ?hat? (plastic device with measurement markings) in your toilet to make stool collection easier  ?Collect the stool specimen and place in red collection container, make sure the container is sealed  ?Keep the sample on ice in a Ziploc bag until you can bring it in  ?Bring in the sample as soon as possible after collection  ?Please drop the sample off by 4pm (our office is closed from 12:30-1:30 for lunch)  ? ?**Orange Container - Bring in at Room Temperature** ?

## 2021-08-30 NOTE — Telephone Encounter (Signed)
Robert Bright with [facility] is supposed to get give [place] a call. ?

## 2021-08-30 NOTE — Telephone Encounter (Signed)
Called and spoke with Leigh and ask if it was the home health that was needing the PT and OT orders and she said yes. So I let her know they need to contact us to let us know what they need. That normally we communicate with them, she should not have to be doing anything. She will talk with them and have them contact us. ?

## 2021-08-30 NOTE — Progress Notes (Signed)
? ?   ? ? ? ? ?Patient Active Problem List  ? Diagnosis Date Noted  ? Sepsis secondary to UTI ([place]) 08/14/2021  ? Hypertension 08/14/2021  ? Acute renal failure superimposed on stage 3b chronic kidney disease ([place]) 08/14/2021  ? Urinary retention 08/14/2021  ? Weight loss, unintentional 04/20/2014  ? Right bundle branch block 10/14/2013  ? Sciatica neuralgia 05/02/2012  ? Hypercholesterolemia 01/18/2011  ? Benign hypertensive heart disease without heart failure 01/18/2011  ? Hx of bladder cancer 01/18/2011  ? History of prostate cancer 01/18/2011  ? ? ?Patient's Medications  ?New Prescriptions  ? No medications on file  ?Previous Medications  ? ACETAMINOPHEN (TYLENOL) 500 MG TABLET    Take 1,000 mg by mouth every 6 (six) hours as needed for mild pain.  ? AMLODIPINE (NORVASC) 2.5 MG TABLET    Take 1 tablet (2.5 mg total) by mouth daily.  ? ATORVASTATIN (LIPITOR) 20 MG TABLET    Take 1 tablet (20 mg total) by mouth daily.  ? CEFAZOLIN (ANCEF) IVPB    Inject 4 g into the vein daily for 25 days. Indication:  MSSA Bacteremia ?First Dose: Yes ?Last Day of Therapy:  09/12/21 ?Labs - Once weekly:  CBC/D and BMP, ?Labs - Every other week:  ESR and CRP ?Method of administration: Elastomeric (Continuous infusion) ?Method of administration may be changed at the discretion of home infusion pharmacist based upon assessment of the patient and/or caregiver's ability to self-administer the medication ordered.  ? FUROSEMIDE (LASIX) 20 MG TABLET    Take 1 tablet (20 mg total) by mouth daily.  ? LORAZEPAM (ATIVAN) 0.5 MG TABLET    TAKE 1 TABLET BY MOUTH EVERY DAY AS NEEDED FOR ANXIETY  ? MELATONIN 5 MG TABS    Take 5 mg by mouth at bedtime as needed (sleep).  ? ONDANSETRON (ZOFRAN) 4 MG TABLET    Take 1 tablet (4 mg total) by mouth every 8 (eight) hours as needed for nausea or vomiting.  ? POTASSIUM CHLORIDE (KLOR-CON M) 10 MEQ TABLET    Take 1 tablet (10 mEq total) by mouth daily.  ? VITAMIN B-12 (CYANOCOBALAMIN) 1000 MCG TABLET     Take 1,000 mcg by mouth daily.  ?Modified Medications  ? No medications on file  ?Discontinued Medications  ? No medications on file  ? ? ?Subjective: ?45 YM with PMHx as below presents for complaint of loose stool He was recently discharged form [facility] for MSSA bacteremia likely 2/2 recent urologic intervention(cystoscopy with fulguration)> He was discharged on cefazolin x 4 weeks form negative Cx EOT 09/12/21. ?Today: Pt presents due to loose stools that  started on Saturday. Daughter accompanied pt to the visit. Seen by PCP on Monday and place don BRAT diet. Started taking on ammodium /day since yeasterday. Pt had 3 stool overnight, more controled and formed.  Denies, fever, chill, N/V/D.  ? ?Past Medical History:  ?Diagnosis Date  ? Anxiety   ? Bladder cancer ([place]) 1993  ? Being followed by [name]  ? ED (erectile dysfunction)   ? GERD (gastroesophageal reflux disease)   ? Heart murmur   ? Hypercholesterolemia   ? Hyperlipidemia   ? Hypertension   ? with a component of whitecoat syndrome  ? Prostate cancer ([place]) 1993  ? Hx ostatectomyof remote treated with radial pr  ? Shingles   ? Stress incontinence   ? Ureteral stricture   ? Vertigo   ? ? ?Social History  ? ?Tobacco Use  ?  Smoking status: Former  ?  Types: Cigarettes  ?  Quit date: 01/16/1986  ?  Years since quitting: 35.6  ? Smokeless tobacco: Never  ?Vaping Use  ? Vaping Use: Never used  ?Substance Use Topics  ? Alcohol use: No  ? Drug use: No  ? ? ?Family History  ?Problem Relation Age of Onset  ? Dementia Mother   ? Arthritis/Rheumatoid Father   ? Healthy Sister   ? Healthy Sister   ? ? ?Allergies  ?Allergen Reactions  ? Ramipril   ?  cough  ? ? ?Health Maintenance  ?Topic Date Due  ? Pneumonia Vaccine 64+ Years old (2 - PCV) 04/25/2019  ? Zoster Vaccines- Shingrix (1 of 2) 11/25/2021 (Originally 09/21/1949)  ? TETANUS/TDAP  03/05/2028  ? INFLUENZA VACCINE  Completed  ? COVID-19 Vaccine  Completed  ? HPV VACCINES  Aged Out  ? ? ?Objective: ? ?Vitals:   ? 08/30/21 1010  ?BP: 124/77  ?Pulse: 68  ?Temp: 98.2 ?F (36.8 ?C)  ?TempSrc: Temporal  ?SpO2: 97%  ? ?There is no height or weight on file to calculate BMI. ? ?Physical Exam ?Constitutional:   ?   General: He is not in acute distress. ?   Appearance: He is normal weight. He is not toxic-appearing.  ?HENT:  ?   Head: Normocephalic and atraumatic.  ?   Right Ear: External ear normal.  ?   Left Ear: External ear normal.  ?   Nose: No congestion or rhinorrhea.  ?   Mouth/Throat:  ?   Mouth: Mucous membranes are moist.  ?   Pharynx: Oropharynx is clear.  ?Eyes:  ?   Extraocular Movements: Extraocular movements intact.  ?   Conjunctiva/sclera: Conjunctivae normal.  ?   Pupils: Pupils are equal, round, and reactive to light.  ?Cardiovascular:  ?   Rate and Rhythm: Normal rate and regular rhythm.  ?   Heart sounds: No murmur heard. ?  No friction rub. No gallop.  ?Pulmonary:  ?   Effort: Pulmonary effort is normal.  ?   Breath sounds: Normal breath sounds.  ?Abdominal:  ?   General: Abdomen is flat. Bowel sounds are normal.  ?   Palpations: Abdomen is soft.  ?Musculoskeletal:     ?   General: No swelling. Normal range of motion.  ?   Cervical back: Normal range of motion and neck supple.  ?Skin: ?   General: Skin is warm and dry.  ?Neurological:  ?   General: No focal deficit present.  ?   Mental Status: He is oriented to person, place, and time.  ?Psychiatric:     ?   Mood and Affect: Mood normal.  ?  ? ?Lab Results ?Lab Results  ?Component Value Date  ? WBC 9.7 08/25/2021  ? HGB 12.3 (L) 08/25/2021  ? HCT 37.2 (L) 08/25/2021  ? MCV 90.7 08/25/2021  ? PLT 353 08/25/2021  ?  ?Lab Results  ?Component Value Date  ? CREATININE 1.77 (H) 08/25/2021  ? BUN 29 (H) 08/25/2021  ? NA 137 08/25/2021  ? K 4.5 08/25/2021  ? CL 105 08/25/2021  ? CO2 20 08/25/2021  ?  ?Lab Results  ?Component Value Date  ? ALT <3 (L) 08/25/2021  ? AST 22 08/25/2021  ? ALKPHOS 101 08/14/2021  ? BILITOT 0.3 08/25/2021  ?  ?Lab Results  ?Component Value  Date  ? CHOL 137 04/01/2021  ? HDL 54 04/01/2021  ? March ARB 65 04/01/2021  ? TRIG 97  04/01/2021  ? CHOLHDL 2.5 04/01/2021  ? ?No results found for: LABRPR, RPRTITER ?No results found for: HIV1RNAQUANT, HIV1RNAVL, CD4TABS ?  ?A/P ?#Diarrhea suspected 2/2 antibiotic use ?-Stools are more formed today. Low suspicion for C. Diff. ?Plan: ?-Continue IV cefazolin ?-cbc, cmp, C. diff and GIP ?-Follow-up on 3/23 ? ? ?Laurice Record, MD ?[facility] ?[facility] ?08/30/2021, 11:51 AM  ?

## 2021-08-30 NOTE — Telephone Encounter (Signed)
Orders for PT and OT evaluation/treatment need to be sent to [facility] per Ebony Hail at fax [phone] ?

## 2021-08-31 ENCOUNTER — Encounter: Payer: Self-pay | Admitting: Internal Medicine

## 2021-08-31 ENCOUNTER — Other Ambulatory Visit: Payer: Self-pay

## 2021-08-31 ENCOUNTER — Telehealth: Payer: Self-pay

## 2021-08-31 ENCOUNTER — Other Ambulatory Visit: Payer: Non-veteran care

## 2021-08-31 DIAGNOSIS — R197 Diarrhea, unspecified: Secondary | ICD-10-CM

## 2021-08-31 LAB — CBC WITH DIFFERENTIAL/PLATELET
Absolute Monocytes: 1156 cells/uL — ABNORMAL HIGH (ref 200–950)
Basophils Absolute: 41 cells/uL (ref 0–200)
Basophils Relative: 0.3 %
Eosinophils Absolute: 136 cells/uL (ref 15–500)
Eosinophils Relative: 1 %
HCT: 35 % — ABNORMAL LOW (ref 38.5–50.0)
Hemoglobin: 11.4 g/dL — ABNORMAL LOW (ref 13.2–17.1)
Lymphs Abs: 1346 cells/uL (ref 850–3900)
MCH: 29.8 pg (ref 27.0–33.0)
MCHC: 32.6 g/dL (ref 32.0–36.0)
MCV: 91.6 fL (ref 80.0–100.0)
MPV: 11.6 fL (ref 7.5–12.5)
Monocytes Relative: 8.5 %
Neutro Abs: 10921 cells/uL — ABNORMAL HIGH (ref 1500–7800)
Neutrophils Relative %: 80.3 %
Platelets: 333 10*3/uL (ref 140–400)
RBC: 3.82 10*6/uL — ABNORMAL LOW (ref 4.20–5.80)
RDW: 12.8 % (ref 11.0–15.0)
Total Lymphocyte: 9.9 %
WBC: 13.6 10*3/uL — ABNORMAL HIGH (ref 3.8–10.8)

## 2021-08-31 LAB — COMPLETE METABOLIC PANEL WITH GFR
AG Ratio: 1 (calc) (ref 1.0–2.5)
ALT: <3 U/L — ABNORMAL LOW (ref 9–46)
AST: 28 U/L (ref 10–35)
Albumin: 3.2 g/dL — ABNORMAL LOW (ref 3.6–5.1)
Alkaline phosphatase (APISO): 75 U/L (ref 35–144)
BUN/Creatinine Ratio: 15 (calc) (ref 6–22)
BUN: 29 mg/dL — ABNORMAL HIGH (ref 7–25)
CO2: 20 mmol/L (ref 20–32)
Calcium: 8.3 mg/dL — ABNORMAL LOW (ref 8.6–10.3)
Chloride: 104 mmol/L (ref 98–110)
Creat: 1.94 mg/dL — ABNORMAL HIGH (ref 0.70–1.22)
Globulin: 3.3 g/dL (calc) (ref 1.9–3.7)
Glucose, Bld: 93 mg/dL (ref 65–99)
Potassium: 3.8 mmol/L (ref 3.5–5.3)
Sodium: 137 mmol/L (ref 135–146)
Total Bilirubin: 0.2 mg/dL (ref 0.2–1.2)
Total Protein: 6.5 g/dL (ref 6.1–8.1)
eGFR: 33 mL/min/{1.73_m2} — ABNORMAL LOW (ref >=60)

## 2021-08-31 NOTE — Telephone Encounter (Signed)
Patient dropped off C.diff specimen today, he dropped off GI Pathogen collection container, but there was no stool sample present in the container. GI panel order canceled. Will route to provider.  ? ?Beryle Flock, RN ? ?

## 2021-08-31 NOTE — Addendum Note (Signed)
Addended by: Tomi Bamberger on: 08/31/2021 12:34 PM ? ? Modules accepted: Orders ? ?

## 2021-08-31 NOTE — Addendum Note (Signed)
Addended by: Lucie Leather D on: 08/31/2021 01:50 PM ? ? Modules accepted: Orders ? ?

## 2021-09-01 ENCOUNTER — Telehealth: Payer: Self-pay | Admitting: Internal Medicine

## 2021-09-01 NOTE — Telephone Encounter (Signed)
LVM for [facility] to CB. ?

## 2021-09-01 NOTE — Telephone Encounter (Signed)
This message was sent via [employer], a product from [employer] http://www.biscom.com/ ? ?                  -------Fax Transmission Report------- ? ?To:               Recipient at [phone] ?Subject:          FW: Hp Scans ?Result:           The transmission was successful. ?Explanation:      All Pages Ok ?Pages Sent:       84 ?Connect Time:     11 minutes, 23 seconds ?Transmit Time:    09/01/2021 13:06 ?Transfer Rate:    14400 ?Status Code:      0000 ?Retry Count:      0 ?Job Id:           [id number] ?Unique Id:        [id number] ?Fax Line:         36 ?Fax Server:       [id number] ? ?

## 2021-09-01 NOTE — Telephone Encounter (Signed)
I have personally contacted Said at [facility] PT to clarify fax number and services requested by Family. He suggested possibly OT as well and that additional order will be included with PT order which will be faxed to him this afternoon along with medical information he requested. MJB, MD ?

## 2021-09-01 NOTE — Telephone Encounter (Signed)
Jasmine from [employer] called wanting [place] to fax the PT, OT order to them at [phone], I let her know we had already faxed it to [place] [facility] [place]. She said she would get in touch with [facility], that they had to put this order in the portal before Thornell Mule could go out and see patient because they actual contract them to go out.  ? ?Jasmine called back and said she got the order from [place], that they do not need anything else. ?

## 2021-09-01 NOTE — Telephone Encounter (Signed)
Faxed order to [facility] along with office notes, discharge summary and demographics [phone], phone number is [phone] or [phone] ?

## 2021-09-02 ENCOUNTER — Encounter: Payer: Self-pay | Admitting: Internal Medicine

## 2021-09-05 ENCOUNTER — Other Ambulatory Visit: Payer: Self-pay

## 2021-09-05 ENCOUNTER — Telehealth: Payer: Self-pay | Admitting: Internal Medicine

## 2021-09-05 DIAGNOSIS — A0472 Enterocolitis due to Clostridium difficile, not specified as recurrent: Secondary | ICD-10-CM

## 2021-09-05 LAB — C. DIFFICILE GDH AND TOXIN A/B
GDH ANTIGEN: DETECTED
MICRO NUMBER:: 13137440
SPECIMEN QUALITY:: ADEQUATE
TOXIN A AND B: NOT DETECTED

## 2021-09-05 LAB — CLOSTRIDIUM DIFFICILE TOXIN B, QUALITATIVE, REAL-TIME PCR: Toxigenic C. Difficile by PCR: DETECTED — AB

## 2021-09-05 MED ORDER — VANCOMYCIN 50 MG/ML ORAL SOLUTION
125.0000 mg | Freq: Four times a day (QID) | ORAL | 0 refills | Status: DC
Start: 2021-09-05 — End: 2021-09-05

## 2021-09-05 MED ORDER — VANCOMYCIN 50 MG/ML ORAL SOLUTION: 125.0000 mg | Freq: Four times a day (QID) | ORAL | 0 refills | Status: DC

## 2021-09-05 MED ORDER — VANCOMYCIN 50 MG/ML ORAL SOLUTION: 125.0000 mg | Freq: Four times a day (QID) | ORAL | 0 refills | Status: AC

## 2021-09-05 NOTE — Telephone Encounter (Signed)
Called pt and left message to call back. Cdiff PCR returned positive, which could represent colonization. But given pt has diarrhea will treat with PO vancomycin.  ?To triage: ?Rx vancomycin qid x 10 days sent to [facility] on [address] ?If pt is taking imodium-please stop ?

## 2021-09-05 NOTE — Addendum Note (Signed)
Addended by: Lucie Leather D on: 09/05/2021 11:35 AM ? ? Modules accepted: Orders ? ?

## 2021-09-05 NOTE — Telephone Encounter (Signed)
Spoke with patient's son, [name]. Relayed that C.diff testing was positive and that Vancomycin has been sent to the pharmacy. Advised that patient should stop imodium. [name] verbalized understanding and has no further questions.  ? ?Beryle Flock, RN ? ?

## 2021-09-08 ENCOUNTER — Encounter: Payer: Self-pay | Admitting: Internal Medicine

## 2021-09-08 ENCOUNTER — Telehealth: Payer: Self-pay

## 2021-09-08 ENCOUNTER — Other Ambulatory Visit: Payer: Self-pay

## 2021-09-08 ENCOUNTER — Ambulatory Visit (INDEPENDENT_AMBULATORY_CARE_PROVIDER_SITE_OTHER): Payer: Medicare Other | Admitting: Internal Medicine

## 2021-09-08 VITALS — BP 181/73 | HR 66 | Temp 98.1°F | Wt 177.0 lb

## 2021-09-08 DIAGNOSIS — R7881 Bacteremia: Secondary | ICD-10-CM | POA: Diagnosis not present

## 2021-09-08 MED ORDER — VANCOMYCIN HCL 125 MG PO CAPS: 125.0000 mg | ORAL_CAPSULE | Freq: Four times a day (QID) | ORAL | 0 refills | Status: DC

## 2021-09-08 NOTE — Telephone Encounter (Signed)
Per Md pull picc after last dose of IV abx on 09/12/21. Called advance and spoke with the pharmacist Aniceto Boss and she verbalized her understanding. ?Adelfa Koh, CMA ? ?

## 2021-09-08 NOTE — Progress Notes (Addendum)
? ?   ? ? ? ? ?Patient Active Problem List  ? Diagnosis Date Noted  ? Sepsis secondary to UTI ([place]) 08/14/2021  ? Hypertension 08/14/2021  ? Acute renal failure superimposed on stage 3b chronic kidney disease ([place]) 08/14/2021  ? Urinary retention 08/14/2021  ? Weight loss, unintentional 04/20/2014  ? Right bundle branch block 10/14/2013  ? Sciatica neuralgia 05/02/2012  ? Hypercholesterolemia 01/18/2011  ? Benign hypertensive heart disease without heart failure 01/18/2011  ? Hx of bladder cancer 01/18/2011  ? History of prostate cancer 01/18/2011  ? ? ?Patient's Medications  ?New Prescriptions  ? No medications on file  ?Previous Medications  ? ACETAMINOPHEN (TYLENOL) 500 MG TABLET    Take 1,000 mg by mouth every 6 (six) hours as needed for mild pain.  ? AMLODIPINE (NORVASC) 2.5 MG TABLET    Take 1 tablet (2.5 mg total) by mouth daily.  ? ATORVASTATIN (LIPITOR) 20 MG TABLET    Take 1 tablet (20 mg total) by mouth daily.  ? CEFAZOLIN (ANCEF) IVPB    Inject 4 g into the vein daily for 25 days. Indication:  MSSA Bacteremia ?First Dose: Yes ?Last Day of Therapy:  09/12/21 ?Labs - Once weekly:  CBC/D and BMP, ?Labs - Every other week:  ESR and CRP ?Method of administration: Elastomeric (Continuous infusion) ?Method of administration may be changed at the discretion of home infusion pharmacist based upon assessment of the patient and/or caregiver's ability to self-administer the medication ordered.  ? FUROSEMIDE (LASIX) 20 MG TABLET    Take 1 tablet (20 mg total) by mouth daily.  ? LORAZEPAM (ATIVAN) 0.5 MG TABLET    TAKE 1 TABLET BY MOUTH EVERY DAY AS NEEDED FOR ANXIETY  ? MELATONIN 5 MG TABS    Take 5 mg by mouth at bedtime as needed (sleep).  ? ONDANSETRON (ZOFRAN) 4 MG TABLET    Take 1 tablet (4 mg total) by mouth every 8 (eight) hours as needed for nausea or vomiting.  ? POTASSIUM CHLORIDE (KLOR-CON M) 10 MEQ TABLET    Take 1 tablet (10 mEq total) by mouth daily.  ? VANCOMYCIN (VANCOCIN) 50 MG/ML SOLN ORAL SOLUTION     Take 2.5 mLs (125 mg total) by mouth every 6 (six) hours for 10 days.  ? VITAMIN B-12 (CYANOCOBALAMIN) 1000 MCG TABLET    Take 1,000 mcg by mouth daily.  ?Modified Medications  ? No medications on file  ?Discontinued Medications  ? No medications on file  ? ? ?Subjective: ?16 YM with PMHx as below presents for complaint of loose stool He was recently discharged form [place] for MSSA bacteremia likely 2/2 recent urologic intervention(cystoscopy with fulguration)> He was discharged on cefazolin x 4 weeks form negative Cx EOT 09/12/21. ?Today: Pt presents due to loose stools that  started on Saturday. Daughter accompanied pt to the visit. Seen by PCP on Monday and place don BRAT diet. Started taking on ammodium /day since yeasterday. Pt had 3 stool overnight, more controled and formed.  Denies, fever, chill, N/V/D.  ?Interval:. Last labs: 3/14  WBC 13, Scr 1.94 with Cdiff PCR returned positive. PCR  could represent colonization. But given pt has diarrhea and labs  Rx vancomycin qid x 10 days ?Today: Diarrhea has resolved. Would like to change vancomycin capsule form liquid due to ease of administration. No issues with PICC line site. Denies fever/chills.  ?Review of Systems: ?Review of Systems  ?All other systems reviewed and are negative. ? ?Past Medical History:  ?Diagnosis  Date  ? Anxiety   ? Bladder cancer ([place]) 1993  ? Being followed by [name]  ? ED (erectile dysfunction)   ? GERD (gastroesophageal reflux disease)   ? Heart murmur   ? Hypercholesterolemia   ? Hyperlipidemia   ? Hypertension   ? with a component of whitecoat syndrome  ? Prostate cancer ([place]) 1993  ? Hx ostatectomyof remote treated with radial pr  ? Shingles   ? Stress incontinence   ? Ureteral stricture   ? Vertigo   ? ? ?Social History  ? ?Tobacco Use  ? Smoking status: Former  ?  Types: Cigarettes  ?  Quit date: 01/16/1986  ?  Years since quitting: 35.6  ? Smokeless tobacco: Never  ?Vaping Use  ? Vaping Use: Never used  ?Substance Use  Topics  ? Alcohol use: No  ? Drug use: No  ? ? ?Family History  ?Problem Relation Age of Onset  ? Dementia Mother   ? Arthritis/Rheumatoid Father   ? Healthy Sister   ? Healthy Sister   ? ? ?Allergies  ?Allergen Reactions  ? Ramipril   ?  cough  ? ? ?Health Maintenance  ?Topic Date Due  ? Pneumonia Vaccine 80+ Years old (2 - PCV) 04/25/2019  ? Zoster Vaccines- Shingrix (1 of 2) 11/25/2021 (Originally 09/21/1949)  ? TETANUS/TDAP  03/05/2028  ? INFLUENZA VACCINE  Completed  ? COVID-19 Vaccine  Completed  ? HPV VACCINES  Aged Out  ? ? ?Objective: ? ?Vitals:  ? 09/08/21 0907  ?Weight: 177 lb (80.3 kg)  ? ?Body mass index is 25.4 kg/m?. ? ?Physical Exam ?Constitutional:   ?   General: He is not in acute distress. ?   Appearance: He is normal weight. He is not toxic-appearing.  ?HENT:  ?   Head: Normocephalic and atraumatic.  ?   Right Ear: External ear normal.  ?   Left Ear: External ear normal.  ?   Nose: No congestion or rhinorrhea.  ?   Mouth/Throat:  ?   Mouth: Mucous membranes are moist.  ?   Pharynx: Oropharynx is clear.  ?Eyes:  ?   Extraocular Movements: Extraocular movements intact.  ?   Conjunctiva/sclera: Conjunctivae normal.  ?   Pupils: Pupils are equal, round, and reactive to light.  ?Cardiovascular:  ?   Rate and Rhythm: Normal rate and regular rhythm.  ?   Heart sounds: No murmur heard. ?  No friction rub. No gallop.  ?   Comments: RUE PICC ?Pulmonary:  ?   Effort: Pulmonary effort is normal.  ?   Breath sounds: Normal breath sounds.  ?Abdominal:  ?   General: Abdomen is flat. Bowel sounds are normal.  ?   Palpations: Abdomen is soft.  ?Musculoskeletal:     ?   General: No swelling. Normal range of motion.  ?   Cervical back: Normal range of motion and neck supple.  ?Skin: ?   General: Skin is warm and dry.  ?Neurological:  ?   General: No focal deficit present.  ?   Mental Status: He is oriented to person, place, and time.  ?Psychiatric:     ?   Mood and Affect: Mood normal.  ? ? ?Lab Results ?Lab Results   ?Component Value Date  ? WBC 13.6 (H) 08/30/2021  ? HGB 11.4 (L) 08/30/2021  ? HCT 35.0 (L) 08/30/2021  ? MCV 91.6 08/30/2021  ? PLT 333 08/30/2021  ?  ?Lab Results  ?Component Value Date  ?  CREATININE 1.94 (H) 08/30/2021  ? BUN 29 (H) 08/30/2021  ? NA 137 08/30/2021  ? K 3.8 08/30/2021  ? CL 104 08/30/2021  ? CO2 20 08/30/2021  ?  ?Lab Results  ?Component Value Date  ? ALT <3 (L) 08/30/2021  ? AST 28 08/30/2021  ? ALKPHOS 101 08/14/2021  ? BILITOT 0.2 08/30/2021  ?  ?Lab Results  ?Component Value Date  ? CHOL 137 04/01/2021  ? HDL 54 04/01/2021  ? [place] 65 04/01/2021  ? TRIG 97 04/01/2021  ? CHOLHDL 2.5 04/01/2021  ? ?No results found for: LABRPR, RPRTITER ?No results found for: HIV1RNAQUANT, HIV1RNAVL, CD4TABS ?  ?A/P ?#MSSA bacteremia likely 2/2 recent urologic intervention(cystoscopy with fulguration) ?-Complete 4 weeks of antibiotics with cefazolin from  negative blood Cx(EOT 3/27) ?-Pull PICC after last dose of antibiotics ?-Follow-up last set of labs ?-Follow-up PRN ? ? ?#Cdiff colitis ?-Diarrhea resolved.  ?-PO vanc complete x 10 days qid 3/30  ?-Secondary PPX with vancomycin bid on 3/31-09/18/21 to complete 7 days of vancomycin from last dose of IV antibiotics. ? ? ?Laurice Record, MD ?[facility] ?09/08/2021, 9:11 AM  ?

## 2021-09-13 ENCOUNTER — Telehealth: Payer: Self-pay | Admitting: Internal Medicine

## 2021-09-13 DIAGNOSIS — M6281 Muscle weakness (generalized): Secondary | ICD-10-CM | POA: Diagnosis not present

## 2021-09-13 DIAGNOSIS — R6520 Severe sepsis without septic shock: Secondary | ICD-10-CM

## 2021-09-13 DIAGNOSIS — N390 Urinary tract infection, site not specified: Secondary | ICD-10-CM

## 2021-09-13 NOTE — Telephone Encounter (Signed)
Faxed signed Certified [place] Orders to [phone], Phone [phone] ? ?Certification period 08/29/2021 to 10/27/2021 ?OT and PT ?

## 2021-09-13 NOTE — Telephone Encounter (Signed)
This message was sent via [place], a product from [employer] http://www.[employer].com/ ? ?                  -------Fax Transmission Report------- ? ?To:               Recipient at [phone] ?Subject:          FW: Hp Scans ?Result:           The transmission was successful. ?Explanation:      All Pages Ok ?Pages Sent:       7 ?Connect Time:     6 minutes, 5 seconds ?Transmit Time:    09/13/2021 11:34 ?Transfer Rate:    14400 ?Status Code:      0000 ?Retry Count:      0 ?Job Id:           [id number] ?Unique Id:        [id number] ?Fax Line:         18 ?Fax Server:       [id number] ? ? ?

## 2021-09-20 ENCOUNTER — Telehealth: Payer: Self-pay | Admitting: Internal Medicine

## 2021-09-20 DIAGNOSIS — Z0289 Encounter for other administrative examinations: Secondary | ICD-10-CM

## 2021-09-20 NOTE — Telephone Encounter (Addendum)
Received Fax from [facility], Dr Collene Leyden phone [phone] and fax [phone] for Medical Records. Patient is transferring care, he seen Dr Collene Leyden yesterday. 09/19/2021. I have faxed 41 pages of medical records to his office, removed Dr Renold Genta name as PCP and canceled all upcoming office appointments. ?

## 2021-09-30 ENCOUNTER — Ambulatory Visit: Payer: Medicare Other | Admitting: Internal Medicine

## 2021-10-01 IMAGING — US US ABDOMEN COMPLETE
1 series · 13 of 25 positions shown · non-contrast
Comparison: Renal ultrasound 03/08/2018. CT abdomen and pelvis
05/23/2006.

CLINICAL DATA: Vomiting and abdominal pain for 2 weeks.

EXAM:
ABDOMEN ULTRASOUND COMPLETE

[Series 1: us abdomen complete · 0.19mm/px · 13 of 93 slices shown]
[im 1/93]
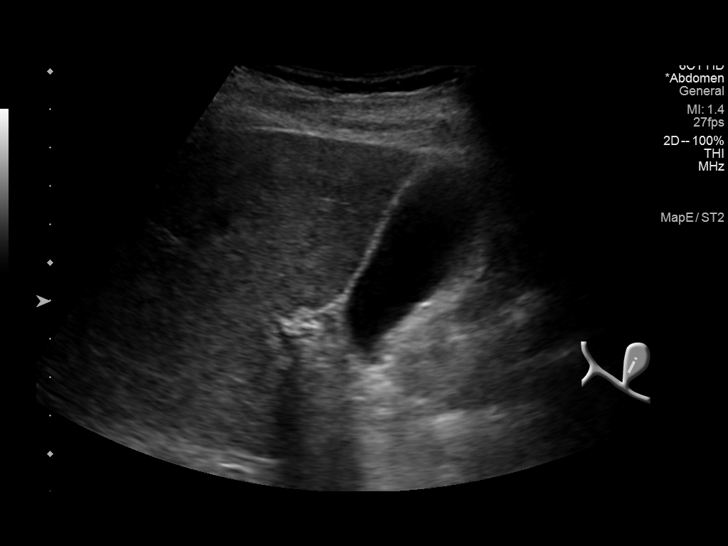
[im 8/93]
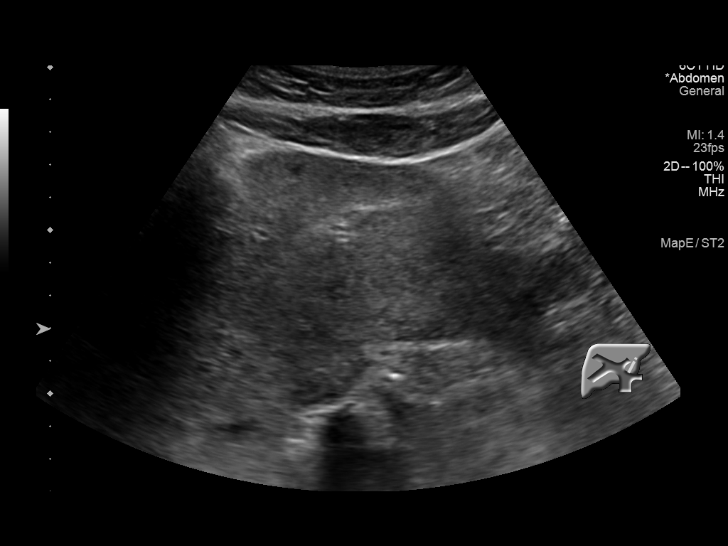
[im 16/93]
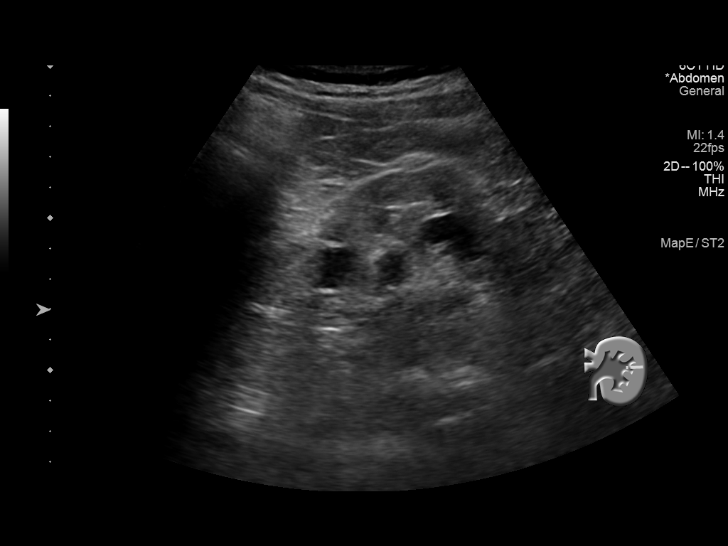
[im 24/93]
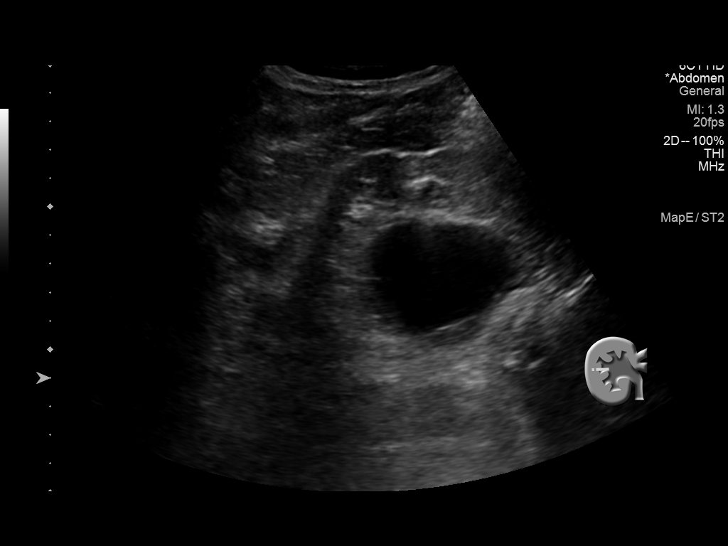
[im 31/93]
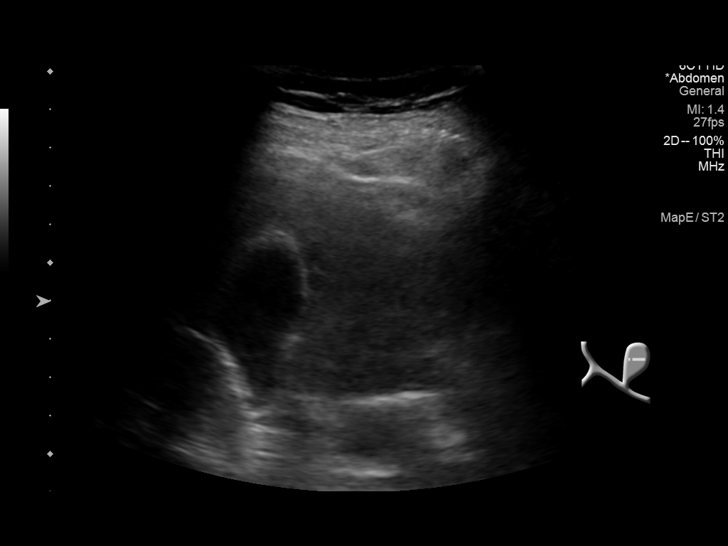
[im 39/93]
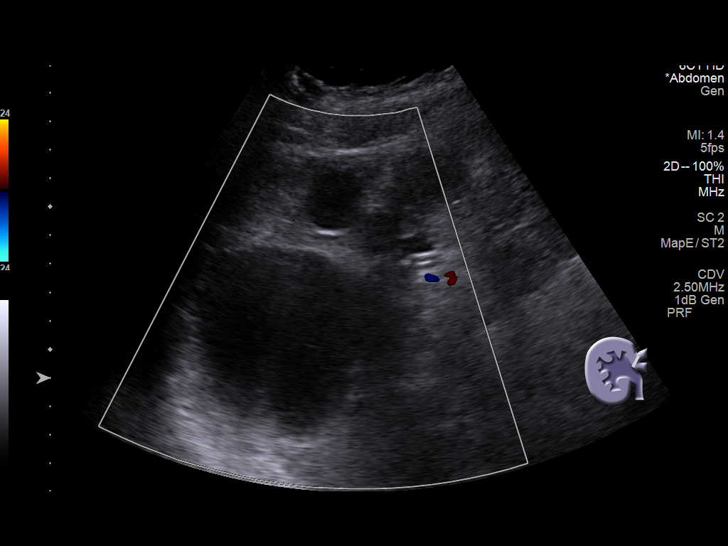
[im 47/93]
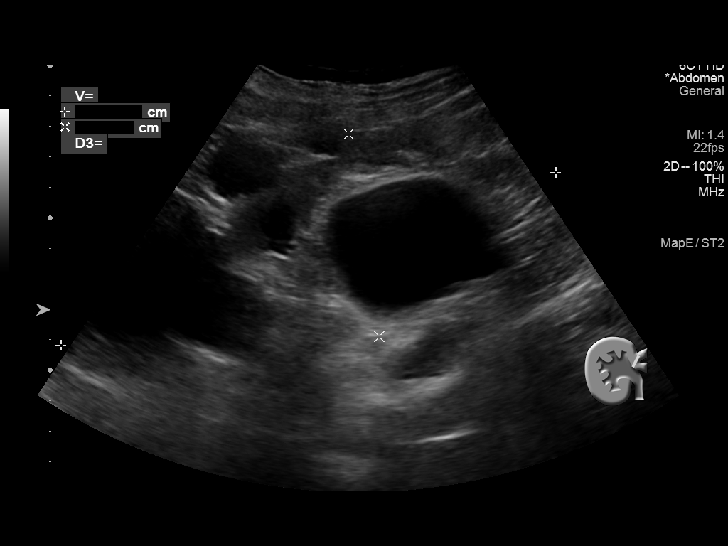
[im 54/93]
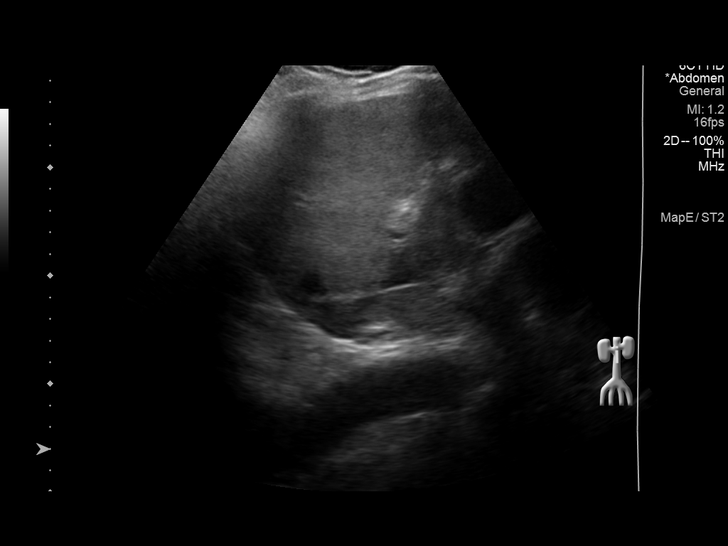
[im 62/93]
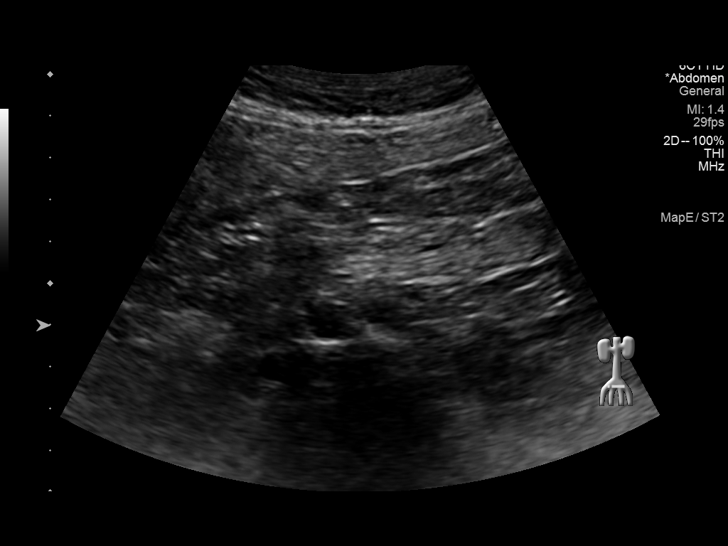
[im 70/93]
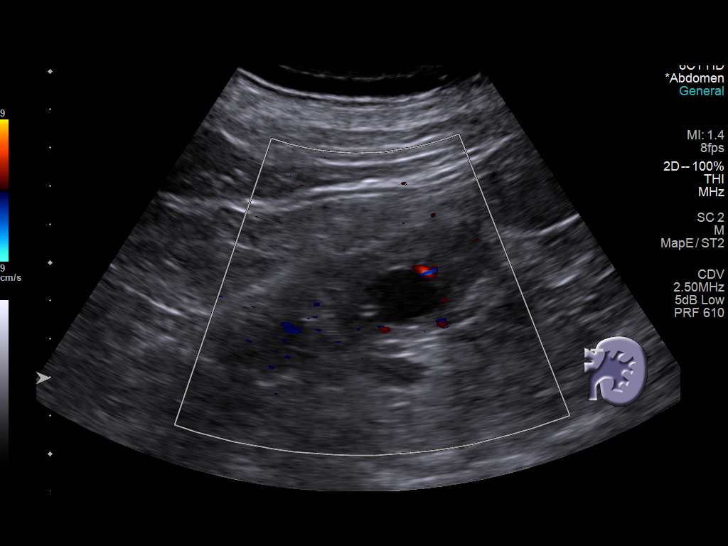
[im 77/93]
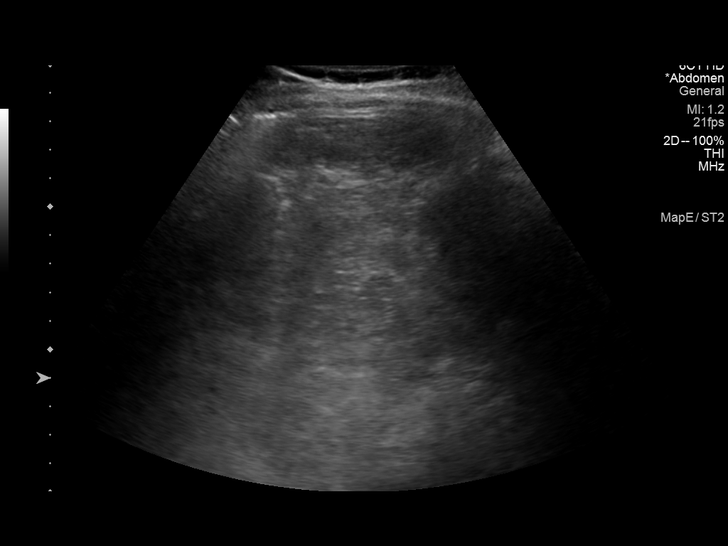
[im 85/93]
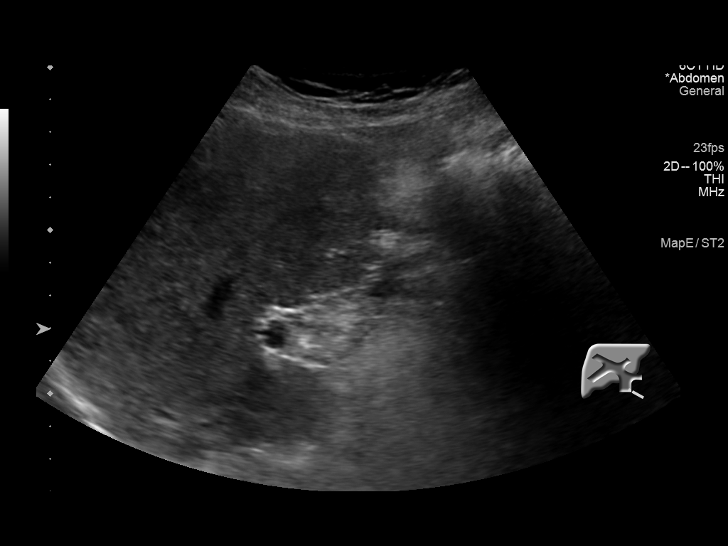
[im 93/93]
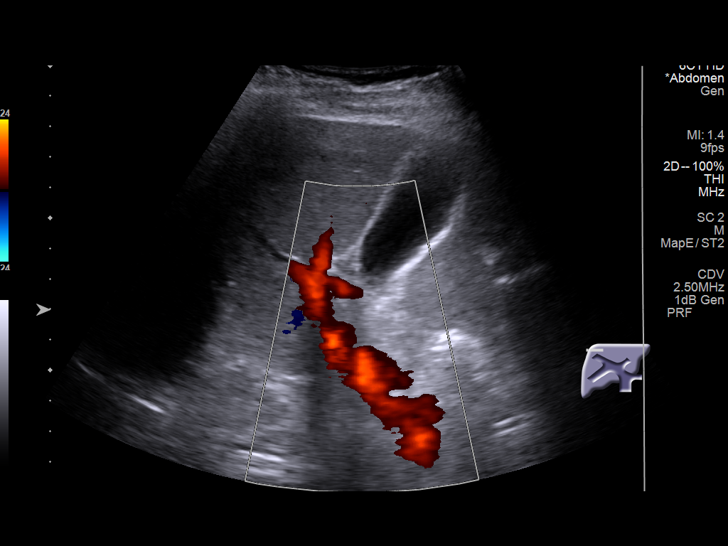

[13 of 25 positions shown; findings below may reference images not displayed]

FINDINGS: Gallbladder: No gallstones or wall thickening visualized. No
sonographic Murphy sign noted by sonographer.

Common bile duct: Diameter: 3 mm

Liver: Diffusely increased parenchymal echogenicity without a focal
lesion identified. Suboptimal assessment of the left hepatic lobe
due to bowel gas. Portal vein is patent on color Doppler imaging
with normal direction of blood flow towards the liver.

IVC: Infrahepatic portion not visualized. No abnormality identified
in the visualized portion.

Pancreas: Poorly visualized due to bowel gas. Visualized portion
grossly unremarkable.

Spleen: Size and appearance within normal limits.

Right Kidney: Length: 17.2 cm. Echogenicity within normal limits. No
hydronephrosis. 7.1 cm and 4.9 cm cysts (previously 6.7 cm and
cm, respectively).

Left Kidney: Length: 11.5 cm. Echogenicity within normal limits. No
hydronephrosis. Multiple cysts with the largest measuring 2.2 cm
(previously 1.6 cm).

Abdominal aorta: Mid abdominal aorta obscured by bowel gas. No
aneurysm visualized.

Other findings: None.
IMPRESSION: 1. No gallstones or evidence of cholecystitis.
2. Echogenic liver, nonspecific though may reflect steatosis.
3. Bilateral renal cysts. No hydronephrosis.
4. Suboptimal assessment of multiple structures due to bowel gas.

## 2021-11-15 ENCOUNTER — Other Ambulatory Visit: Payer: Self-pay | Admitting: Internal Medicine

## 2021-12-28 ENCOUNTER — Other Ambulatory Visit: Payer: Self-pay | Admitting: Urology

## 2022-01-09 NOTE — Progress Notes (Addendum)
COVID Vaccine received:  []  No [x]  Yes  Date of any COVID positive Test in last 90 days: None  PCP - Irven Coe, MD Cardiologist - Kristeen Miss, MD Infectious Disease - Danelle Earthly, MD  Chest x-ray -  EKG -  08-16-21  Epic Stress Test - 2004 ECHO - 08-16-21 Epic Cardiac Cath -   Pacemaker/ICD device last checked: Date:       [x]  N/A Spinal Cord Stimulator:[]  No []  Yes   Other Implants:   History of Sleep Apnea? [x]  No []  Yes  []  unknown Sleep Study Date:   CPAP used?- []  No []  Yes  (Instruct to bring their mask & Tubing)  Does the patient monitor blood sugar? [x]  No []  Yes  []  N/A  Blood Thinner Instructions: none Aspirin Instructions: Last Dose:  Activity level:  Can go up a flight of stairs and perform activities of daily living without stopping and without symptoms of chest pain or shortness of breath.[]  No [x]  Yes  []  N/A  Able to do exercises without symptoms []  No [x]  Yes  []  N/A  Patient able to complete ADLs without assistance []  No [x]  Yes  []  N/A    Comments: Hx Bladder Cancer, GERD,   Anesthesia review: Heart Murmur (aortic valve regurg. No AS on Echo) anemia, HTN  Patient denies shortness of breath, fever, cough and chest pain at PAT appointment. Patient verbalized understanding and agreement to the Pre-Surgical Instructions that were given to them at this PAT appointment. Patient was also educated of the need to review these PAT instructions again prior to his/her surgery.I reviewed the appropriate phone numbers to call if they have any and questions or concerns.

## 2022-01-09 NOTE — Patient Instructions (Signed)
DUE TO SPACE LIMITATIONS, ONLY TWO VISITORS  (aged 86 and older) ARE ALLOWED TO COME WITH YOU AND STAY IN THE WAITING ROOM DURING YOUR PRE OP AND PROCEDURE.   **NO VISITORS ARE ALLOWED IN THE SHORT STAY AREA OR RECOVERY ROOM!!**  You are not required to quarantine at this time prior to your surgery. However, you must do this: Hand Hygiene often Do NOT share personal items Notify your provider if you are in close contact with someone who has COVID or you develop fever 100.4 or greater, new onset of sneezing, cough, sore throat, shortness of breath or body aches.       Your procedure is scheduled on:  Thursday January 19, 2022  Report to [facility] Main Entrance.  Report to admitting at:  2:30  PM +++++Call this number if you have any questions or problems the morning of surgery [phone]  Do not eat food :After Midnight the night prior to your surgery/procedure.  After Midnight you may have the following liquids until  1:45  PM DAY OF SURGERY  Clear Liquid Diet Water Black Coffee (sugar ok, NO MILK/CREAM OR CREAMERS)  Tea (sugar ok, NO MILK/CREAM OR CREAMERS) regular and decaf                             Plain Jell-O (NO RED)                                           Fruit ices (not with fruit pulp, NO RED)                                     Popsicles (NO RED)                                                                  Juice: apple, WHITE grape, WHITE cranberry Sports drinks like Gatorade (NO RED)                    FOLLOW ANY ADDITIONAL PRE OP INSTRUCTIONS YOU RECEIVED FROM YOUR SURGEON'S OFFICE!!!   Oral Hygiene is also important to reduce your risk of infection.        Remember - BRUSH YOUR TEETH THE MORNING OF SURGERY WITH YOUR REGULAR TOOTHPASTE  Take ONLY these medicines the morning of surgery with A SIP OF WATER: Amlodipine. You may also take Tylenol and Ativan as needed.   Bring CPAP mask and tubing day of surgery.                   You may not have  any metal on your body including jewelry, and body piercing  Do not wear lotions, powders, cologne, or deodorant  Men may shave face and neck.  Contacts, Hearing Aids, dentures or bridgework may not be worn into surgery.   DO NOT [place]. PHARMACY WILL DISPENSE MEDICATIONS LISTED ON YOUR MEDICATION LIST TO YOU DURING YOUR ADMISSION [place]!   Patients discharged on the day  of surgery will not be allowed to drive home.  Someone NEEDS to stay with you for the first 24 hours after anesthesia.  Special Instructions: Bring a copy of your healthcare power of attorney and living will documents the day of surgery, if you wish to have them scanned into your [place] Medical Records- EPIC  Please read over the following fact sheets you were given: IF YOU HAVE QUESTIONS ABOUT YOUR PRE-OP INSTRUCTIONS, PLEASE CALL [phone]  ([place])   [place] - Preparing for Surgery Before surgery, you can play an important role.  Because skin is not sterile, your skin needs to be as free of germs as possible.  You can reduce the number of germs on your skin by washing with CHG (chlorahexidine gluconate) soap before surgery.  CHG is an antiseptic cleaner which kills germs and bonds with the skin to continue killing germs even after washing. Please DO NOT use if you have an allergy to CHG or antibacterial soaps.  If your skin becomes reddened/irritated stop using the CHG and inform your nurse when you arrive at Short Stay. Do not shave (including legs and underarms) for at least 48 hours prior to the first CHG shower.  You may shave your face/neck.  Please follow these instructions carefully:  1.  Shower with CHG Soap the night before surgery and the  morning of surgery.  2.  If you choose to wash your hair, wash your hair first as usual with your normal  shampoo.  3.  After you shampoo, rinse your hair and body thoroughly to remove the shampoo.                              4.  Use CHG as you would any other liquid soap.  You can apply chg directly to the skin and wash.  Gently with a scrungie or clean washcloth.  5.  Apply the CHG Soap to your body ONLY FROM THE NECK DOWN.   Do not use on face/ open                           Wound or open sores. Avoid contact with eyes, ears mouth and genitals (private parts).                       Wash face,  Genitals (private parts) with your normal soap.             6.  Wash thoroughly, paying special attention to the area where your  surgery  will be performed.  7.  Thoroughly rinse your body with warm water from the neck down.  8.  DO NOT shower/wash with your normal soap after using and rinsing off the CHG Soap.            9.  Pat yourself dry with a clean towel.            10.  Wear clean pajamas.            11.  Place clean sheets on your bed the night of your first shower and do not  sleep with pets.  ON THE DAY OF SURGERY : Do not apply any lotions/deodorants the morning of surgery.  Please wear clean clothes to the hospital/surgery center.    FAILURE TO FOLLOW THESE INSTRUCTIONS MAY RESULT IN THE CANCELLATION OF YOUR SURGERY  PATIENT SIGNATURE_________________________________  NURSE SIGNATURE__________________________________  ________________________________________________________________________

## 2022-01-10 ENCOUNTER — Encounter (HOSPITAL_COMMUNITY): Payer: Self-pay

## 2022-01-10 ENCOUNTER — Encounter (HOSPITAL_COMMUNITY)
Admission: RE | Admit: 2022-01-10 | Discharge: 2022-01-10 | Disposition: A | Discharge status: Home / Self Care | Payer: Medicare Other | Source: Ambulatory Visit | Attending: Urology | Admitting: Urology

## 2022-01-10 ENCOUNTER — Other Ambulatory Visit: Payer: Self-pay

## 2022-01-10 VITALS — BP 153/76 | HR 55 | Temp 98.4°F | Resp 24 | Ht 70.0 in | Wt 166.0 lb

## 2022-01-10 DIAGNOSIS — Z01812 Encounter for preprocedural laboratory examination: Secondary | ICD-10-CM | POA: Diagnosis present

## 2022-01-10 DIAGNOSIS — Z01818 Encounter for other preprocedural examination: Secondary | ICD-10-CM

## 2022-01-10 DIAGNOSIS — I10 Essential (primary) hypertension: Secondary | ICD-10-CM | POA: Insufficient documentation

## 2022-01-10 HISTORY — DX: Unspecified osteoarthritis, unspecified site: M19.90

## 2022-01-10 LAB — BASIC METABOLIC PANEL
Anion gap: 7 (ref 5–15)
BUN: 35 mg/dL — ABNORMAL HIGH (ref 8–23)
CO2: 25 mmol/L (ref 22–32)
Calcium: 9.3 mg/dL (ref 8.9–10.3)
Chloride: 108 mmol/L (ref 98–111)
Creatinine, Ser: 1.98 mg/dL — ABNORMAL HIGH (ref 0.61–1.24)
GFR, Estimated: 32 mL/min — ABNORMAL LOW (ref >60)
Glucose, Bld: 99 mg/dL (ref 70–99)
Potassium: 4.4 mmol/L (ref 3.5–5.1)
Sodium: 140 mmol/L (ref 135–145)

## 2022-01-18 NOTE — H&P (Signed)
Office Visit Report     12/23/2021   --------------------------------------------------------------------------------   Robert Bright  MRN: [id number]  DOB: 1930-10-07, 88 year old Male  SSN: [id number]   PRIMARY CARE:  Emeline General, MD  REFERRING:  Daine Gravel, NP  PROVIDER:  Raynelle Bring, M.D.  TREATING:  Jiles Crocker, NP  LOCATION:  [facility] [phone]     --------------------------------------------------------------------------------   CC/HPI: 1. Bladder cancer  2. Prostate cancer   08/12/2021: Robert Bright follows up today for surveillance of his bladder cancer and prostate cancer. Unfortunately, he lost his wife last summer. He has denied any hematuria or change in voiding habits. He follows up with his most recent PSA in for surveillance cystoscopy today.   09/09/2021: In brief: After last visit, the patient developed urinary retention as well as systemic infection secondary to MSSA bacteremia. Also with concurrent C. difficile colitis. Being followed by infectious disease he has a PICC line in place and is receiving Ancef daily through 3/27. Also on oral vancomycin through 2 April. His catheter was removed here by nursing staff on 3/17. He follows up today for repeat exam with PVR.   Patient is doing quite well. He denies any new or worsening difficulty with starting his stream. He is voiding every couple of hours during the day without significant or painful/leaking urgency. He denies any recurrence of hematuria or dysuria since catheter removal. Nocturia remained stable x1. Overall he has been progressing quite nicely with improved strength and mobility. Denies recent fevers or chills, nausea/vomiting.   11/02/2021: Robert Bright is an 88 year old man whose past urological history includes bladder cancer and prostate cancer. He has also had a bladder neck contracture in the past requiring dilation. He was last dilated the end of February beginning of  March which is approximately 3 months ago. Over the past week he endorses increased incontinence associated with frequency and urgency. He is also waking up with some leakage in the middle of the night. He denies fevers, chills. His urine is not concerning for infection today. His PVR shows approximately 500 mL of urine. Attempted to place straight catheter due to conern for stricture recurrence and I was unable to pass a size 14 french or size 16 french in to the bladder.   11/22/2021: 88 year old man who presents today for trial of void. At last office visit he was found to have a severe bulbous urethral stricture which was dilated up to an 32 [place] using Hyman dilators. This was completed by Dr. Diona Fanti.   11/24/2021: Robert Bright presents today for an office visit and PVR. He reports he is voiding well without difficulty. He denies fevers, chills, gross hematuria. He denies straining on urination.   12/23/2021: Robert Bright returns. He has been having worse incontinence usually associated with painful urgency relieved when voiding. With frequency 1-2 times per hour and multiple times at night. Stream is weak, intermittent and he is not having some associated hesitancy/straining as well. Despite this he is not having any dysuria, gross hematuria or other correlating signs/symptoms of systemic infection. UA with abnormalities consistent with an underlying infectious process. He has 554 mL in his bladder. I attempted to pass a 30 [place] coud catheter without success. We then attempted a straight in and out catheter with a 14 [place] but again that was unsuccessful. His primary urologist, [name] is on-call. He was consulted and brought to the bedside.     ALLERGIES: No Allergies  MEDICATIONS: Acetaminophen  Amlodipine Besylate 2.5 mg tablet  Atorvastatin Calcium 20 mg tablet  Furosemide 20 mg tablet  Lorazepam 0.5 mg tablet Oral  Melatonin 5 mg capsule  Norvasc 5 mg tablet Oral  Ondansetron Hcl 4  mg tablet  Potassium Chloride 10 meq capsule, extended release  Vitamin B12     GU PSH: Catheterization For Collection Of Specimen, Single Patient, All Places Of Service - 09/02/2021 Complex Uroflow - 2017 Cysto Bladder Ureth Biopsy - 2009 Cysto Dilate Stricture (M or F) - 11/02/2021, 2018, 2010 Cystoscopy - 08/05/2021, 07/23/2020, 2021, 2020, 2020, 2019, 2019, 2018, 2018, 2017, 2017 Cystoscopy TURBT <2 cm - 2017, 2010 Insert Bladder Cath; Complex - 11/02/2021 Radical Prostatectomy - 2008 Subsequent Male F & F's - 2018       PSH Notes: Eye Surgery   NON-GU PSH: No Non-GU PSH    GU PMH: Bladder-neck stenosis/contracture - 11/24/2021, - 11/22/2021, Significant bulbous urethral stricture, dilated today, catheter placed., - 11/02/2021, - 07/23/2020, Bladder neck contracture, - 2017 Urinary Retention - 11/24/2021, (Stable), - 11/22/2021 (Stable), - 09/09/2021, - 09/02/2021 (Acute), Foley placed and will begin Rapaflo 8 mg 1 po daily. RTC in 2 days for NV voiding trail , - 2017 Acute Cystitis/UTI - 09/09/2021 Bladder Cancer overlapping sites - 08/05/2021, - 2017, Cancer of overlapping sites of bladder, - 2017 History of prostate cancer - 08/05/2021, - 07/23/2020, - 2017 History of bladder cancer - 07/23/2020, - 2018 ED due to arterial insufficiency (Stable) - 2018, Erectile dysfunction due to arterial insufficiency, - 2017 Prostate Cancer, Prostate cancer - 2017 Stress Incontinence, Male stress incontinence - 2014 Urethral Stricture, Unspec, Urethral stricture - 2014      PMH Notes:  1) Prostate cancer: He is status post a radical retropubic prostatectomy by Dr. Laurence Ferrari in 1993. His PSA has thus far been undetectable.   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   2003: Initial TURBT by Dr. Joelyn Oms  2005: Recurrence - TURBT by Dr. Joelyn Oms  April 2009: Recurrence - TURBT, Post-op [facility] -- Low grade Ta  July 2009: Recurrence - TURBT -- Low grade Ta  Feb 2010:  Recurrence - TURBT -- Low grade Ta  Mar 2013: Recurrence with fulguration of two small papillary tumors  Jul 2013: Recurrence with fulguration of two small papillary tumors  Jul 2014: Recurrence with fulguration of one small papillary tumor  Jun 2015: Recurrence with fulguration of small papillary tumor  Nov 2017: TURBT - Low grade, Ta   Last upper tract evaluation: Nov 2017 - negative   3) Bladder neck contracture / urethral stricture: He has had difficulty undergoing surveillance office cystoscopy as a result of a bladder neck contracture likely related to his prostatectomy. He underwent balloon dilation of his bladder neck at his TURBT in April 2009 which has since made office cystoscopy possible. He required a repeat dilation in February 2010. He has been able to tolerate dilation with filiform and followers in the office. He was noted to have a small membranous urethral stricture in March 2013 requiring dilation and intermittently since then.   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   5) Incontinence: He did develop symptoms of stress incontinence following dilation of his bladder neck in April 2009 which subsequently improved.     NON-GU PMH: Bacteriuria - 2017 Anxiety, Anxiety -  2014 Hypercholesterolemia Hypertension    FAMILY HISTORY: Breast Cancer - Daughter No pertinent family history - Other   SOCIAL HISTORY: Marital Status: Married Preferred Language: English; Ethnicity: Not Hispanic Or Latino; Race: White Current Smoking Status: Patient has never smoked.  Does not drink anymore.  Does not use drugs. Drinks 2 caffeinated drinks per day.    REVIEW OF SYSTEMS:    GU Review Male:   Patient denies get up at night to urinate, penile pain, hard to postpone urination, stream starts and stops, burning/ pain with urination, frequent urination,  erection problems, have to strain to urinate , leakage of urine, and trouble starting your stream.  Gastrointestinal (Upper):   Patient denies nausea, vomiting, and indigestion/ heartburn.  Gastrointestinal (Lower):   Patient denies diarrhea and constipation.  Constitutional:   Patient denies fever, night sweats, weight loss, and fatigue.  Skin:   Patient denies skin rash/ lesion and itching.  Eyes:   Patient denies blurred vision and double vision.  Ears/ Nose/ Throat:   Patient denies sore throat and sinus problems.  Hematologic/Lymphatic:   Patient denies swollen glands and easy bruising.  Cardiovascular:   Patient denies leg swelling and chest pains.  Respiratory:   Patient denies cough and shortness of breath.  Endocrine:   Patient denies excessive thirst.  Musculoskeletal:   Patient denies back pain and joint pain.  Neurological:   Patient denies headaches and dizziness.  Psychologic:   Patient denies depression and anxiety.   VITAL SIGNS:      12/23/2021 02:55 PM  BP 171/94 mmHg  Heart Rate 99 /min  Temperature 97.5 F / 36.3 C   GU PHYSICAL EXAMINATION:    Urethral Meatus: Normal size. No lesion, no wart, no discharge, no polyp. Normal location.  Penis: Circumcised, no warts, no cracks. No dorsal Peyronie's plaques, no left corporal Peyronie's plaques, no right corporal Peyronie's plaques, no scarring, no warts. No balanitis, no meatal stenosis.   MULTI-SYSTEM PHYSICAL EXAMINATION:    Constitutional: Well-nourished. No physical deformities. Normally developed. Good grooming.  Neck: Neck symmetrical, not swollen. Normal tracheal position.  Respiratory: No labored breathing, no use of accessory muscles.   Cardiovascular: Normal temperature, normal extremity pulses, no swelling, no varicosities.  Skin: No paleness, no jaundice, no cyanosis. No lesion, no ulcer, no rash.  Neurologic / Psychiatric: Oriented to time, oriented to place, oriented to person. No depression, no anxiety, no  agitation.  Gastrointestinal: No mass, no tenderness, no rigidity, non obese abdomen.  Musculoskeletal: Normal gait and station of head and neck.     Complexity of Data:  Source Of History:  Patient, Medical Record Summary  Records Review:   Previous Doctor Records, Previous Hospital Records, Previous Patient Records  Urine Test Review:   Urinalysis, Urine Culture  Urodynamics Review:   Review Bladder Scan   07/29/21 06/15/20 04/29/19 03/22/18 02/25/18 02/28/17 12/01/15 03/18/15  PSA  Total PSA 0.015 ng/mL <0.015 ng/mL 0.028 ng/mL <0.015 ng/mL 0.1 ng/dl 0.026 ng/dL 0.04  0.02     12/23/21  Urinalysis  Urine Appearance Cloudy   Urine Color Yellow   Urine Glucose Neg mg/dL  Urine Bilirubin Neg mg/dL  Urine Ketones Neg mg/dL  Urine Specific Gravity 1.020   Urine Blood 2+ ery/uL  Urine pH 5.5   Urine Protein 1+ mg/dL  Urine Urobilinogen 0.2 mg/dL  Urine Nitrites Neg   Urine Leukocyte Esterase 3+ leu/uL  Urine WBC/hpf >60/hpf   Urine RBC/hpf 3 - 10/hpf   Urine Epithelial Cells NS (Not  Seen)   Urine Bacteria Mod (26-50/hpf)   Urine Mucous Not Present   Urine Yeast NS (Not Seen)   Urine Trichomonas Not Present   Urine Cystals NS (Not Seen)   Urine Casts NS (Not Seen)   Urine Sperm Not Present    PROCEDURES:         Flexible Cystoscopy - 52000  Risks, benefits, and some of the potential complications of the procedure were discussed at length with the patient including infection, bleeding, voiding discomfort, urinary retention, fever, chills, sepsis, and others. All questions were answered. Informed consent was obtained. Antibiotic prophylaxis was given. Sterile technique and intraurethral analgesia were used.  Meatus:  Normal size. Normal location. Normal condition.  Urethra:  Severe bulbar urethral stricture was noted. I was able to pass a Sensor wire and then used the Brewster sounds to dilate him from 12 Fr up to 20 Fr. I was then able to place a 16 Fr Council catheter with  return of clear urine.      The lower urinary tract was carefully examined. The procedure was well-tolerated and without complications. Antibiotic instructions were given. Instructions were given to call the office immediately for bloody urine, difficulty urinating, urinary retention, painful or frequent urination, fever, chills, nausea, vomiting or other illness. The patient stated that he understood these instructions and would comply with them.        PVR Ultrasound - 65465  Scanned Volume: 554 cc         Urinalysis w/Scope Dipstick Dipstick Cont'd Micro  Color: Yellow Bilirubin: Neg mg/dL WBC/hpf: >60/hpf  Appearance: Cloudy Ketones: Neg mg/dL RBC/hpf: 3 - 10/hpf  Specific Gravity: 1.020 Blood: 2+ ery/uL Bacteria: Mod (26-50/hpf)  pH: 5.5 Protein: 1+ mg/dL Cystals: NS (Not Seen)  Glucose: Neg mg/dL Urobilinogen: 0.2 mg/dL Casts: NS (Not Seen)    Nitrites: Neg Trichomonas: Not Present    Leukocyte Esterase: 3+ leu/uL Mucous: Not Present      Epithelial Cells: NS (Not Seen)      Yeast: NS (Not Seen)      Sperm: Not Present    Notes: QNS for spun micro    ASSESSMENT:      ICD-10 Details  1 GU:   Urinary Retention - K35.4 Acute, Complicated Injury  2   Bulbar urethral stricture - S56.812 Acute, Complicated Injury   PLAN:            Medications New Meds: Cipro 250 mg tablet 1 tablet PO BID   #14  0 Refill(s)  Pharmacy Name:  [id number]  Address:  [address]   [place], [place] [id number]  Phone:  [phone]  Fax:  [phone]            Orders Labs Urine Culture          Schedule Return Visit/Planned Activity: Next Available Appointment - Schedule Surgery          Document Letter(s):  Created for Patient: Clinical Summary         Notes:   Patient with recurrence of urinary retention in the setting of a severe bulbar urethral stricture which has recurred. [name] was able to pass a guidewire through the stricture and into the  bladder. Using the [facility] dilators the stricture was then dilated from 12-20 [place]. A 16 French council tip catheter was placed into the bladder and the bladder was drained. Patient was able to void upon arrival and UA certainly appears infected but he is  not systemically ill nor having bothersome dysuria or gross hematuria. I did go ahead and empirically treat, antibiotics sent to pharmacy as well as a urine culture sent to lab. [name] had a discussion regarding plans to take him to the operating room in the near future for formal balloon dilation of his stricture. Risk versus benefits discussed in detail, all questions answered with understanding expressed by the patient. He elects to proceed. His catheter will remain in situ until this occurs.        Next Appointment:      Next Appointment: 04/05/2022 01:45 PM    Appointment Type: Male Cysto    Location: [facility] [phone]    Provider: Raynelle Bring, M.D.    Reason for Visit: 88moov, cysto      * Signed by LJiles Crocker NP on 12/26/21 at 4:45 PM (EDT)*

## 2022-01-19 ENCOUNTER — Ambulatory Visit (HOSPITAL_COMMUNITY)
Admission: RE | Admit: 2022-01-19 | Discharge: 2022-01-19 | Disposition: A | Discharge status: Home / Self Care | Payer: Medicare Other | Source: Ambulatory Visit | Attending: Urology | Admitting: Urology

## 2022-01-19 ENCOUNTER — Encounter (HOSPITAL_COMMUNITY)
Admission: RE | Disposition: A | Discharge status: Home / Self Care | Payer: Self-pay | Source: Ambulatory Visit | Attending: Urology

## 2022-01-19 ENCOUNTER — Encounter (HOSPITAL_COMMUNITY): Payer: Self-pay | Admitting: Urology

## 2022-01-19 ENCOUNTER — Ambulatory Visit (HOSPITAL_BASED_OUTPATIENT_CLINIC_OR_DEPARTMENT_OTHER): Payer: Medicare Other | Admitting: Certified Registered Nurse Anesthetist

## 2022-01-19 ENCOUNTER — Ambulatory Visit (HOSPITAL_COMMUNITY): Payer: Medicare Other | Admitting: Physician Assistant

## 2022-01-19 DIAGNOSIS — I10 Essential (primary) hypertension: Secondary | ICD-10-CM

## 2022-01-19 DIAGNOSIS — Z87891 Personal history of nicotine dependence: Secondary | ICD-10-CM

## 2022-01-19 DIAGNOSIS — I4510 Unspecified right bundle-branch block: Secondary | ICD-10-CM | POA: Insufficient documentation

## 2022-01-19 DIAGNOSIS — I080 Rheumatic disorders of both mitral and aortic valves: Secondary | ICD-10-CM | POA: Diagnosis not present

## 2022-01-19 DIAGNOSIS — Z8551 Personal history of malignant neoplasm of bladder: Secondary | ICD-10-CM | POA: Insufficient documentation

## 2022-01-19 DIAGNOSIS — Z79899 Other long term (current) drug therapy: Secondary | ICD-10-CM | POA: Insufficient documentation

## 2022-01-19 DIAGNOSIS — N35919 Unspecified urethral stricture, male, unspecified site: Secondary | ICD-10-CM

## 2022-01-19 DIAGNOSIS — F419 Anxiety disorder, unspecified: Secondary | ICD-10-CM | POA: Insufficient documentation

## 2022-01-19 DIAGNOSIS — Z8546 Personal history of malignant neoplasm of prostate: Secondary | ICD-10-CM | POA: Diagnosis not present

## 2022-01-19 DIAGNOSIS — K219 Gastro-esophageal reflux disease without esophagitis: Secondary | ICD-10-CM | POA: Insufficient documentation

## 2022-01-19 HISTORY — PX: CYSTOSCOPY WITH URETHRAL DILATATION: SHX5125

## 2022-01-19 SURGERY — CYSTOSCOPY, WITH URETHRAL DILATION
Anesthesia: General

## 2022-01-19 MED ORDER — PHENYLEPHRINE 80 MCG/ML (10ML) SYRINGE FOR IV PUSH (FOR BLOOD PRESSURE SUPPORT)
PREFILLED_SYRINGE | INTRAVENOUS | Status: AC
  Filled 2022-01-19: qty 10

## 2022-01-19 MED ORDER — DEXAMETHASONE SODIUM PHOSPHATE 10 MG/ML IJ SOLN
INTRAMUSCULAR | Status: DC | PRN
  Administered 2022-01-19: 5 mg via INTRAVENOUS

## 2022-01-19 MED ORDER — PROPOFOL 10 MG/ML IV BOLUS
INTRAVENOUS | Status: DC | PRN
  Administered 2022-01-19 (×2): 50 mg via INTRAVENOUS

## 2022-01-19 MED ORDER — ONDANSETRON HCL 4 MG/2ML IJ SOLN
INTRAMUSCULAR | Status: DC | PRN
  Administered 2022-01-19: 4 mg via INTRAVENOUS

## 2022-01-19 MED ORDER — STERILE WATER FOR IRRIGATION IR SOLN
Status: DC | PRN
  Administered 2022-01-19: 3000 mL

## 2022-01-19 MED ORDER — PROPOFOL 10 MG/ML IV BOLUS
INTRAVENOUS | Status: AC
  Filled 2022-01-19: qty 20

## 2022-01-19 MED ORDER — LIDOCAINE HCL (PF) 2 % IJ SOLN
INTRAMUSCULAR | Status: AC
  Filled 2022-01-19: qty 5

## 2022-01-19 MED ORDER — ORAL CARE MOUTH RINSE: 15.0000 mL | Freq: Once | OROMUCOSAL | Status: AC

## 2022-01-19 MED ORDER — CHLORHEXIDINE GLUCONATE 0.12 % MT SOLN
15.0000 mL | Freq: Once | OROMUCOSAL | Status: AC
  Administered 2022-01-19: 15 mL via OROMUCOSAL

## 2022-01-19 MED ORDER — ONDANSETRON HCL 4 MG/2ML IJ SOLN
INTRAMUSCULAR | Status: AC
Start: 2022-01-19 — End: ?
  Filled 2022-01-19: qty 2

## 2022-01-19 MED ORDER — EPHEDRINE 5 MG/ML INJ
INTRAVENOUS | Status: AC
  Filled 2022-01-19: qty 5

## 2022-01-19 MED ORDER — CEFAZOLIN SODIUM-DEXTROSE 2-4 GM/100ML-% IV SOLN
2.0000 g | Freq: Once | INTRAVENOUS | Status: AC
  Administered 2022-01-19: 2 g via INTRAVENOUS
  Filled 2022-01-19: qty 100

## 2022-01-19 MED ORDER — FENTANYL CITRATE (PF) 100 MCG/2ML IJ SOLN
INTRAMUSCULAR | Status: AC
  Filled 2022-01-19: qty 2

## 2022-01-19 MED ORDER — PHENYLEPHRINE 80 MCG/ML (10ML) SYRINGE FOR IV PUSH (FOR BLOOD PRESSURE SUPPORT)
PREFILLED_SYRINGE | INTRAVENOUS | Status: DC | PRN
  Administered 2022-01-19: 160 ug via INTRAVENOUS

## 2022-01-19 MED ORDER — LACTATED RINGERS IV SOLN: INTRAVENOUS | Status: DC

## 2022-01-19 MED ORDER — DEXAMETHASONE SODIUM PHOSPHATE 10 MG/ML IJ SOLN
INTRAMUSCULAR | Status: AC
  Filled 2022-01-19: qty 1

## 2022-01-19 MED ORDER — FENTANYL CITRATE (PF) 100 MCG/2ML IJ SOLN
INTRAMUSCULAR | Status: DC | PRN
  Administered 2022-01-19: 25 ug via INTRAVENOUS

## 2022-01-19 MED ORDER — EPHEDRINE SULFATE-NACL 50-0.9 MG/10ML-% IV SOSY
PREFILLED_SYRINGE | INTRAVENOUS | Status: DC | PRN
  Administered 2022-01-19: 5 mg via INTRAVENOUS

## 2022-01-19 MED ORDER — ACETAMINOPHEN 500 MG PO TABS
1000.0000 mg | ORAL_TABLET | Freq: Once | ORAL | Status: AC
  Administered 2022-01-19: 1000 mg via ORAL
  Filled 2022-01-19: qty 2

## 2022-01-19 MED ORDER — LIDOCAINE HCL (CARDIAC) PF 100 MG/5ML IV SOSY
PREFILLED_SYRINGE | INTRAVENOUS | Status: DC | PRN
  Administered 2022-01-19: 60 mg via INTRAVENOUS

## 2022-01-19 SURGICAL SUPPLY — 21 items
BAG URINE DRAIN 2000ML AR STRL (UROLOGICAL SUPPLIES) ×2 IMPLANT
BAG URINE LEG 500ML (DRAIN) ×1 IMPLANT
BALLN NEPHROSTOMY (BALLOONS) ×2
BALLOON NEPHROSTOMY (BALLOONS) IMPLANT
CATH FOLEY 2W COUNCIL 20FR 5CC (CATHETERS) ×1 IMPLANT
CATH ROBINSON RED A/P 14FR (CATHETERS) IMPLANT
CATH URET 5FR 28IN CONE TIP (BALLOONS)
CATH URET 5FR 70CM CONE TIP (BALLOONS) IMPLANT
CATH URETL OPEN END 6FR 70 (CATHETERS) IMPLANT
CLOTH BEACON ORANGE TIMEOUT ST (SAFETY) ×2 IMPLANT
GLOVE BIO SURGEON STRL SZ7.5 (GLOVE) ×2 IMPLANT
GOWN STRL REUS W/ TWL XL LVL3 (GOWN DISPOSABLE) ×1 IMPLANT
GOWN STRL REUS W/TWL XL LVL3 (GOWN DISPOSABLE) ×2
GUIDEWIRE ANG ZIPWIRE 038X150 (WIRE) IMPLANT
GUIDEWIRE STR DUAL SENSOR (WIRE) ×1 IMPLANT
KIT TURNOVER KIT A (KITS) IMPLANT
MANIFOLD NEPTUNE II (INSTRUMENTS) IMPLANT
NS IRRIG 1000ML POUR BTL (IV SOLUTION) IMPLANT
PACK CYSTO (CUSTOM PROCEDURE TRAY) ×2 IMPLANT
PENCIL SMOKE EVACUATOR (MISCELLANEOUS) IMPLANT
WATER STERILE IRR 3000ML UROMA (IV SOLUTION) ×2 IMPLANT

## 2022-01-19 NOTE — Anesthesia Preprocedure Evaluation (Addendum)
Anesthesia Evaluation  Patient identified by MRN, date of birth, ID band Patient awake    Reviewed: Allergy & Precautions, NPO status , Patient's Chart, lab work & pertinent test results  Airway Mallampati: II  TM Distance: >3 FB Neck ROM: Full    Dental  (+) Missing, Dental Advisory Given,    Pulmonary neg pulmonary ROS, former smoker,    Pulmonary exam normal breath sounds clear to auscultation       Cardiovascular hypertension, Pt. on medications Normal cardiovascular exam+ dysrhythmias + Valvular Problems/Murmurs (mild/mod AI, mild MR) AI and MR  Rhythm:Regular Rate:Normal  RBBB  TTE 2023 1. Left ventricular ejection fraction, by estimation, is 60 to 65%. The  left ventricle has normal function. The left ventricle has no regional  wall motion abnormalities. There is mild concentric left ventricular  hypertrophy. Left ventricular diastolic  parameters are consistent with Grade I diastolic dysfunction (impaired  relaxation).  2. Right ventricular systolic function is normal. The right ventricular  size is normal. There is mildly elevated pulmonary artery systolic  pressure.  3. The mitral valve is normal in structure. Mild mitral valve  regurgitation. No evidence of mitral stenosis.  4. The aortic valve is tricuspid. Aortic valve regurgitation is mild to  moderate. Aortic valve sclerosis is present, with no evidence of aortic  valve stenosis.  5. The inferior vena cava is dilated in size with >50% respiratory  variability, suggesting right atrial pressure of 8 mmHg.   Neuro/Psych PSYCHIATRIC DISORDERS Anxiety negative neurological ROS     GI/Hepatic Neg liver ROS, GERD  ,  Endo/Other  negative endocrine ROS  Renal/GU negative Renal ROS  negative genitourinary   Musculoskeletal negative musculoskeletal ROS (+)   Abdominal   Peds  Hematology negative hematology ROS (+)   Anesthesia Other Findings    Reproductive/Obstetrics                           Anesthesia Physical Anesthesia Plan  ASA: 3  Anesthesia Plan: General   Post-op Pain Management: Tylenol PO (pre-op)*   Induction: Intravenous  PONV Risk Score and Plan: 2 and Ondansetron and Dexamethasone  Airway Management Planned: LMA  Additional Equipment:   Intra-op Plan:   Post-operative Plan: Extubation in OR  Informed Consent: I have reviewed the patients History and Physical, chart, labs and discussed the procedure including the risks, benefits and alternatives for the proposed anesthesia with the patient or authorized representative who has indicated his/her understanding and acceptance.     Dental advisory given  Plan Discussed with: CRNA  Anesthesia Plan Comments:         Anesthesia Quick Evaluation

## 2022-01-19 NOTE — Anesthesia Procedure Notes (Signed)
Procedure Name: LMA Insertion Date/Time: 01/19/2022 4:06 PM  Performed by: Raenette Rover, CRNAPre-anesthesia Checklist: Patient identified, Emergency Drugs available, Suction available and Patient being monitored Patient Re-evaluated:Patient Re-evaluated prior to induction Oxygen Delivery Method: Circle system utilized Preoxygenation: Pre-oxygenation with 100% oxygen Induction Type: IV induction Ventilation: Mask ventilation without difficulty LMA: LMA inserted LMA Size: 4.0 Grade View: Grade I Tube type: Oral Number of attempts: 1 Placement Confirmation: positive ETCO2 and breath sounds checked- equal and bilateral Tube secured with: Tape Dental Injury: Teeth and Oropharynx as per pre-operative assessment

## 2022-01-19 NOTE — Op Note (Signed)
Preoperative diagnosis: Urethral stricture  Postoperative diagnosis: Urethral stricture  Procedure: Cystoscopy with balloon dilation of urethral stricture  Surgeon: Lester S. Borden, Jr MD  Anesthesia: General  Complications: None  EBL: Minimal  Intraoperative findings: There were remnants of a previously dilated bulbar urethral stricture.  Indication: Mr. Speirs is an 88-year-old gentleman with a history of a bulbar urethral stricture status post radical prostatectomy for prostate cancer in the past.  He also has a history of urothelial carcinoma and has required transurethral resection of bladder tumors.  He recently developed almost complete urethral obstruction due to a pinpoint bulbar urethral stricture.  This required serial dilation in the office.  Due to repeated episodes, he presents today for more definitive balloon dilation of his stricture.  The potential risks, complications, and expected recovery process were discussed in detail.  Informed consent was obtained.  Description of procedure: The patient was taken to the operating room and a general anesthetic was administered.  He was given preoperative antibiotics, placed in the dorsolithotomy position, and prepped and draped in the usual sterile fashion.  Next, a preoperative timeout was performed.  Cystourethroscopy was then performed which revealed a normal anterior urethra.  There was evidence of the previously noted bulbar urethral stricture although the 22 French cystoscope was able to bypass this area.  However, there were noted to be changes consistent with his previous stricture with devascularized mucosa.  Inspection the bladder revealed no bladder tumors or other significant mucosal pathology.  A 0.38 sensor guidewire was then advanced into the bladder.  The 24 French dilating balloon was then passed over the wire and across the strictured site under direct vision.  This was then inflated to 18 atm of pressure and left inflated  for 5 minutes.  The balloon was then deflated.  Reinspection revealed good dilation of the urethral stricture.  A 20 French Foley catheter was inserted.  The patient tolerated the procedure well without complications.  He was able to be awakened and transferred to recovery in satisfactory condition. 

## 2022-01-19 NOTE — Interval H&P Note (Signed)
History and Physical Interval Note:  01/19/2022 3:31 PM  Robert Bright  has presented today for surgery, with the diagnosis of URETHRAL STRICTURE.  The various methods of treatment have been discussed with the patient and family. After consideration of risks, benefits and other options for treatment, the patient has consented to  Procedure(s): CYSTOSCOPY WITH  BALLOON URETHRAL DILATATION (N/A) as a surgical intervention.  The patient's history has been reviewed, patient examined, no change in status, stable for surgery.  I have reviewed the patient's chart and labs.  Questions were answered to the patient's satisfaction.     [employer]

## 2022-01-19 NOTE — Discharge Instructions (Signed)

## 2022-01-19 NOTE — Interval H&P Note (Signed)
History and Physical Interval Note:  01/19/2022 2:48 PM  Robert Bright  has presented today for surgery, with the diagnosis of URETHRAL STRICTURE.  The various methods of treatment have been discussed with the patient and family. After consideration of risks, benefits and other options for treatment, the patient has consented to  Procedure(s): CYSTOSCOPY WITH  BALLOON URETHRAL DILATATION (N/A) as a surgical intervention.  The patient's history has been reviewed, patient examined, no change in status, stable for surgery.  I have reviewed the patient's chart and labs.  Questions were answered to the patient's satisfaction.     [employer]

## 2022-01-19 NOTE — Anesthesia Postprocedure Evaluation (Signed)
Anesthesia Post Note  Patient: Robert Bright  Procedure(s) Performed: CYSTOSCOPY WITH  BALLOON URETHRAL DILATATION     Patient location during evaluation: PACU Anesthesia Type: General Level of consciousness: awake and alert Pain management: pain level controlled Vital Signs Assessment: post-procedure vital signs reviewed and stable Respiratory status: spontaneous breathing, nonlabored ventilation, respiratory function stable and patient connected to nasal cannula oxygen Cardiovascular status: blood pressure returned to baseline and stable Postop Assessment: no apparent nausea or vomiting Anesthetic complications: no   No notable events documented.  Last Vitals:  Vitals:   01/19/22 1632 01/19/22 1645  BP: (!) 132/55 123/73  Pulse: 63 (!) 45  Resp: 17 13  Temp: (!) 36.4 C   SpO2: 100% 95%    Last Pain:  Vitals:   01/19/22 1632  TempSrc:   PainSc: 0-No pain                 Lucyann Romano L Zayaan Kozak

## 2022-01-19 NOTE — Transfer of Care (Signed)
Immediate Anesthesia Transfer of Care Note  Patient: Robert Bright  Procedure(s) Performed: CYSTOSCOPY WITH  BALLOON URETHRAL DILATATION  Patient Location: PACU  Anesthesia Type:General  Level of Consciousness: awake, drowsy and patient cooperative  Airway & Oxygen Therapy: Patient Spontanous Breathing and Patient connected to face mask oxygen  Post-op Assessment: Report given to RN and Post -op Vital signs reviewed and stable  Post vital signs: Reviewed and stable  Last Vitals:  Vitals Value Taken Time  BP 132/55 01/19/22 1632  Temp    Pulse 57 01/19/22 1633  Resp 16 01/19/22 1633  SpO2 100 % 01/19/22 1633  Vitals shown include unvalidated device data.  Last Pain:  Vitals:   01/19/22 1451  TempSrc:   PainSc: 0-No pain         Complications: No notable events documented.

## 2022-01-20 ENCOUNTER — Encounter (HOSPITAL_COMMUNITY): Payer: Self-pay | Admitting: Urology

## 2022-03-20 ENCOUNTER — Telehealth: Payer: Self-pay

## 2022-03-20 MED ORDER — FUROSEMIDE 20 MG PO TABS: 20.0000 mg | ORAL_TABLET | Freq: Every day | ORAL | 3 refills | Status: DC

## 2022-03-20 NOTE — Telephone Encounter (Signed)
[facility] pharmacy is requesting a refill on pt's medication furosemide 40 mg tablets. There are two different strength on medication list. Please clarify the directions and what strength pt is supposed to be taking. Thanks

## 2022-03-20 NOTE — Telephone Encounter (Signed)
Seen here by Dr Acie Fredrickson on 05/16/21 and started on lasix '40mg'$  daily.  Per ED notes on 08/14/21: Hypertension -Continue Norvasc 2.5 mg daily -We will cut down the dose of Lasix to 20 mg daily  Patient has had multiple encounters since this admission and has switched PCP's since also. He was struggling with foley cath and sepsis secondary to urinary retention, but from what I can see in the electronic trail, he should be on '20mg'$  daily. Per chart, pt's daughter reported on 01/10/22 that he was taking '20mg'$  every other day. Called and spoke with daughter who states that the QOD was temporary dosing. She confirms that his current dose and what he's actually taking is '20mg'$  DAILY. Will send refill to pharmacy.

## 2022-04-03 ENCOUNTER — Other Ambulatory Visit: Payer: Medicare Other

## 2022-04-06 ENCOUNTER — Ambulatory Visit: Payer: Medicare Other | Admitting: Internal Medicine

## 2022-04-14 ENCOUNTER — Other Ambulatory Visit: Payer: Self-pay | Admitting: Urology

## 2022-04-14 NOTE — Progress Notes (Signed)
PCP - Collene Leyden, MD Cardiologist - Cleatrice Burke, MD  PPM/ICD -  Device Orders -  Rep Notified -   Chest x-ray - 08-14-21 epic EKG - 07-27-21 epic Stress Test -  ECHO - 08-16-21 epic Cardiac Cath -   Sleep Study -  CPAP -   Fasting Blood Sugar -  Checks Blood Sugar _____ times a day  Blood Thinner Instructions: Aspirin Instructions:  ERAS Protcol - PRE-SURGERY Ensure or G2-    COVID vaccine -  Activity-- Anesthesia review: HTN, Murmur  Patient denies shortness of breath, fever, cough and chest pain at PAT appointment   All instructions explained to the patient, with a verbal understanding of the material. Patient agrees to go over the instructions while at home for a better understanding. Patient also instructed to self quarantine after being tested for COVID-19. The opportunity to ask questions was provided.

## 2022-04-14 NOTE — Progress Notes (Signed)
Please place orders in epic. 

## 2022-04-18 ENCOUNTER — Encounter (HOSPITAL_COMMUNITY): Payer: Self-pay

## 2022-04-18 ENCOUNTER — Encounter (HOSPITAL_COMMUNITY)
Admission: RE | Admit: 2022-04-18 | Discharge: 2022-04-18 | Disposition: A | Discharge status: Home / Self Care | Payer: Medicare Other | Source: Ambulatory Visit | Attending: Urology | Admitting: Urology

## 2022-04-18 ENCOUNTER — Other Ambulatory Visit: Payer: Self-pay

## 2022-04-18 VITALS — Ht 70.0 in | Wt 172.0 lb

## 2022-04-18 DIAGNOSIS — I10 Essential (primary) hypertension: Secondary | ICD-10-CM

## 2022-04-18 DIAGNOSIS — E7800 Pure hypercholesterolemia, unspecified: Secondary | ICD-10-CM

## 2022-04-18 NOTE — Progress Notes (Signed)
PCP - Collene Leyden, MD Cardiologist - Cleatrice Burke, MD  LOV 05-16-21  PPM/ICD -  Device Orders -  Rep Notified -   Chest x-ray - 08-14-21 epic EKG - 07-27-21 epic Stress Test -  ECHO - 08-16-21 epic Cardiac Cath -   Sleep Study -  CPAP -   Fasting Blood Sugar -  Checks Blood Sugar _____ times a day  Blood Thinner Instructions: Aspirin Instructions:  ERAS Protcol -N/A PRE-SURGERY Ensure or G2-    COVID vaccine -yes  Activity--Walks with cane with no SOB Lives at [facility] assisted living Anesthesia review: HTN, Murmur  Patient denies shortness of breath, fever, cough and chest pain at PAT appointment   All instructions explained to the patient, with a verbal understanding of the material. Patient agrees to go over the instructions while at home for a better understanding. Patient also instructed to self quarantine after being tested for COVID-19. The opportunity to ask questions was provided.

## 2022-04-19 NOTE — H&P (Signed)
Office Visit Report     04/13/2022   --------------------------------------------------------------------------------   Robert Bright  MRN: [id number]  DOB: [birth year], 88 year old Male  SSN: [id number]   PRIMARY CARE:  Emeline General, MD  REFERRING:  Raynelle Bring, Eduardo Osier  PROVIDER:  Raynelle Bring, M.D.  LOCATION:  [facility] [phone]     --------------------------------------------------------------------------------   CC/HPI: 1. Bladder cancer  2. Prostate cancer  3. Bladder neck stenosis/urethral stricture   Mr. Kanitz follows up today. He had undergone balloon dilation of his urethral stricture in August. He returned last week for cystoscopy but was noted to have bacteriuria. He was treated appropriately based on his culture and follows up today. He still has 1 more tablet of his antibiotic to take. He still has the same difficulties voiding. These have not changed over the past week. He denies hematuria.     ALLERGIES: No Allergies    MEDICATIONS: Acetaminophen  Amlodipine Besylate 5 mg tablet tablet  Atorvastatin Calcium 20 mg tablet  Furosemide 20 mg tablet  Lorazepam 0.5 mg tablet Oral  Melatonin 5 mg capsule  Ondansetron Hcl 4 mg tablet  Potassium Chloride 10 meq capsule, extended release  Vitamin B12     GU PSH: Catheterization For Collection Of Specimen, Single Patient, All Places Of Service - 09/02/2021 Complex Uroflow - 2017 Cysto Bladder Ureth Biopsy - 2009 Cysto Dilate Stricture (M or F) - 12/23/2021, 11/02/2021, 2018, 2010 Cysto Dilate Ureteral Stricture - 01/19/2022 Cystoscopy - 12/23/2021, 08/05/2021, 2022, 2021, 2020, 2020, 2019, 2019, 2018, 2018, 2017, 2017 Cystoscopy TURBT <2 cm - 2017, 2010 Insert Bladder Cath; Complex - 11/02/2021 Radical Prostatectomy - 2008 Subsequent Male F & F's - 2018       PSH Notes: Eye Surgery   NON-GU PSH: No Non-GU PSH    GU PMH: History of bladder cancer - 04/05/2022, - 2022, - 2018 Bulbar  urethral stricture - 02/23/2022, - 01/26/2022, - 12/23/2021 Urinary Retention - 02/23/2022, - 01/26/2022, - 12/23/2021, - 11/24/2021 (Stable), - 11/22/2021 (Stable), - 09/09/2021, - 09/02/2021 (Acute), Foley placed and will begin Rapaflo 8 mg 1 po daily. RTC in 2 days for NV voiding trail , - 2017 Bladder-neck stenosis/contracture - 11/24/2021, - 11/22/2021, Significant bulbous urethral stricture, dilated today, catheter placed., - 11/02/2021, - 2022, Bladder neck contracture, - 2017 Acute Cystitis/UTI - 09/09/2021 Bladder Cancer overlapping sites - 08/05/2021, - 2017, Cancer of overlapping sites of bladder, - 2017 History of prostate cancer - 08/05/2021, - 2022, - 2017 ED due to arterial insufficiency (Stable) - 2018, Erectile dysfunction due to arterial insufficiency, - 2017 Prostate Cancer, Prostate cancer - 2017 Stress Incontinence, Male stress incontinence - 2014 Urethral Stricture, Unspec, Urethral stricture - 2014      PMH Notes:   1) Prostate cancer: He is status post a radical retropubic prostatectomy by Dr. Laurence Ferrari in 1993. His PSA has thus far been undetectable.   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   2003: Initial TURBT by Dr. Joelyn Oms  2005: Recurrence - TURBT by Dr. Joelyn Oms  April 2009: Recurrence - TURBT, Post-op [facility] -- Low grade Ta  July 2009: Recurrence - TURBT -- Low grade Ta  Feb 2010: Recurrence - TURBT -- Low grade Ta  Mar 2013: Recurrence with fulguration of two small papillary tumors  Jul 2013: Recurrence with fulguration of two small papillary tumors  Jul 2014: Recurrence with fulguration of one small papillary tumor  Jun 2015: Recurrence with fulguration of small papillary tumor  Nov 2017: TURBT - Low grade, Ta   Last upper tract evaluation: Nov 2017 - negative   3) Bladder neck contracture / urethral stricture: He has had difficulty undergoing surveillance office cystoscopy as a result of a bladder neck contracture likely  related to his prostatectomy. He underwent balloon dilation of his bladder neck at his TURBT in April 2009 which has since made office cystoscopy possible. He required a repeat dilation in February 2010. He has been able to tolerate dilation with filiform and followers in the office. He was noted to have a small membranous urethral stricture in March 2013 requiring dilation and intermittently since then. He developed recurrent episodes of urinary retention in 2023 requiring catheter placement multiple times with failed voiding trials and eventually underwent balloon dilation.   Sep 2018: Dilated with filiforms and followers  Aug 2023: Balloon dilation   4) Erectile dysfunction: He does have post-prostatectomy erectile dysfunction and does use MUSE 1000 mcg with good results.   5) Incontinence: He did develop symptoms of stress incontinence following dilation of his bladder neck in April 2009 which subsequently improved.     NON-GU PMH: Bacteriuria (Stable) - 04/05/2022, - 2017 Anxiety, Anxiety - 2014 Hypercholesterolemia Hypertension    FAMILY HISTORY: Breast Cancer - Daughter No pertinent family history - Other   SOCIAL HISTORY: Marital Status: Married Preferred Language: English; Ethnicity: Not Hispanic Or Latino; Race: White Current Smoking Status: Patient has never smoked.  Does not drink anymore.  Does not use drugs. Drinks 2 caffeinated drinks per day.    REVIEW OF SYSTEMS:    GU Review Male:   Patient denies frequent urination, hard to postpone urination, burning/ pain with urination, get up at night to urinate, leakage of urine, stream starts and stops, trouble starting your streams, and have to strain to urinate .  Gastrointestinal (Upper):   Patient denies nausea and vomiting.  Gastrointestinal (Lower):   Patient denies diarrhea and constipation.  Constitutional:   Patient denies fever, night sweats, weight loss, and fatigue.  Skin:   Patient denies skin rash/ lesion and  itching.  Eyes:   Patient denies blurred vision and double vision.  Ears/ Nose/ Throat:   Patient denies sore throat and sinus problems.  Hematologic/Lymphatic:   Patient denies swollen glands and easy bruising.  Cardiovascular:   Patient denies leg swelling and chest pains.  Respiratory:   Patient denies cough and shortness of breath.  Endocrine:   Patient denies excessive thirst.  Musculoskeletal:   Patient denies joint pain and back pain.  Neurological:   Patient denies headaches and dizziness.  Psychologic:   Patient denies depression and anxiety.   VITAL SIGNS: None   MULTI-SYSTEM PHYSICAL EXAMINATION:    Constitutional: Well-nourished. No physical deformities. Normally developed. Good grooming.  Respiratory: No labored breathing, no use of accessory muscles. Clear bilaterally.  Cardiovascular: Normal temperature, normal extremity pulses, no swelling, no varicosities. Regular rate and rhythm.     Complexity of Data:  Records Review:   Previous Patient Records   07/29/21 06/15/20 04/29/19 03/22/18 02/25/18 02/28/17 12/01/15 03/18/15  PSA  Total PSA 0.015 ng/mL <0.015 ng/mL 0.028 ng/mL <0.015 ng/mL 0.1 ng/dl 0.026 ng/dL 0.04  0.02     PROCEDURES:         Flexible Cystoscopy - 52000  Indication: Bladder neck stenosis/bladder cancer Risks, benefits, and potential complications of the procedure were discussed with the patient including infection, bleeding, voiding discomfort, urinary retention, fever,  chills, sepsis, and others. All questions were answered. Informed consent was obtained. Sterile technique and intraurethral analgesia were used.  Meatus:  Normal size. Normal location. Normal condition.  Urethra:  There is a dense bulbar urethral stricture. I attempted to use filiforms and followers but could not bypassed this area. I even performed flexible urethroscopy next to the filiform but could not directed through the small pinpoint opening.  External Sphincter:  Normal.   Verumontanum:  Verumontanum Surgically Absent.  Prostate:  Prostate Surgically Absent.      Chaperone: BS The procedure was well-tolerated and without complications. Instructions were given to call the office immediately if questions or problems.         Urinalysis w/Scope Dipstick Dipstick Cont'd Micro  Color: Yellow Bilirubin: Neg mg/dL WBC/hpf: NS (Not Seen)  Appearance: Clear Ketones: Neg mg/dL RBC/hpf: 0 - 2/hpf  Specific Gravity: 1.020 Blood: Neg ery/uL Bacteria: NS (Not Seen)  pH: 6.0 Protein: 1+ mg/dL Cystals: NS (Not Seen)  Glucose: Neg mg/dL Urobilinogen: 0.2 mg/dL Casts: NS (Not Seen)    Nitrites: Neg Trichomonas: Not Present    Leukocyte Esterase: Neg leu/uL Mucous: Present      Epithelial Cells: 0 - 5/hpf      Yeast: NS (Not Seen)      Sperm: Not Present    ASSESSMENT:      ICD-10 Details  1 GU:   Bladder-neck stenosis/contracture - N32.0   2   Bulbar urethral stricture - N35.011    PLAN:           Schedule Return Visit/Planned Activity: Other See Visit Notes             Note: Will call to schedule surgery.          Document Letter(s):  Created for Patient: Clinical Summary         Notes:   1. Bladder cancer: I was unable to perform cystoscopy today. This will be evaluated on his upcoming operative cystoscopic procedure.   2. Prostate cancer: His PSA will be due to be checked in February 2024.   3. Bladder neck stenosis/urethral stricture: Unfortunately, this has recurred rather quickly. Office cystoscopy is unable to be performed and I am unable to dilate him in the office. I therefore recommended that he proceed with cystoscopy under anesthesia with balloon dilation and possible Optilume balloon dilation. We will then leave a catheter and plan to have him return to remove his catheter for voiding trial and to begin teaching for CIC.   CC: Dr. Emeline General        Next Appointment:      Next Appointment: 04/20/2022 11:30 AM    Appointment Type:  Surgery     Location: [facility] [phone]    Provider: Raynelle Bring, M.D.    Reason for Visit: WL/OP Lezlie Lye DILATION      * Signed by Raynelle Bring, M.D. on 04/14/22 at 7:35 AM (EDT)*

## 2022-04-20 ENCOUNTER — Ambulatory Visit (HOSPITAL_COMMUNITY): Payer: Medicare Other | Admitting: Registered Nurse

## 2022-04-20 ENCOUNTER — Ambulatory Visit (HOSPITAL_BASED_OUTPATIENT_CLINIC_OR_DEPARTMENT_OTHER): Payer: Medicare Other | Admitting: Registered Nurse

## 2022-04-20 ENCOUNTER — Ambulatory Visit (HOSPITAL_COMMUNITY)
Admission: RE | Admit: 2022-04-20 | Discharge: 2022-04-20 | Disposition: A | Discharge status: Home / Self Care | Payer: Medicare Other | Attending: Urology | Admitting: Urology

## 2022-04-20 ENCOUNTER — Encounter (HOSPITAL_COMMUNITY)
Admission: RE | Disposition: A | Discharge status: Home / Self Care | Payer: Self-pay | Source: Home / Self Care | Attending: Urology

## 2022-04-20 ENCOUNTER — Ambulatory Visit (HOSPITAL_COMMUNITY): Payer: Medicare Other

## 2022-04-20 ENCOUNTER — Encounter (HOSPITAL_COMMUNITY): Payer: Self-pay | Admitting: Urology

## 2022-04-20 DIAGNOSIS — Z8546 Personal history of malignant neoplasm of prostate: Secondary | ICD-10-CM | POA: Diagnosis not present

## 2022-04-20 DIAGNOSIS — C679 Malignant neoplasm of bladder, unspecified: Secondary | ICD-10-CM | POA: Diagnosis not present

## 2022-04-20 DIAGNOSIS — F418 Other specified anxiety disorders: Secondary | ICD-10-CM | POA: Diagnosis not present

## 2022-04-20 DIAGNOSIS — N35912 Unspecified bulbous urethral stricture, male: Secondary | ICD-10-CM

## 2022-04-20 DIAGNOSIS — I10 Essential (primary) hypertension: Secondary | ICD-10-CM | POA: Diagnosis not present

## 2022-04-20 HISTORY — PX: BALLOON DILATION: SHX5330

## 2022-04-20 LAB — BASIC METABOLIC PANEL WITH GFR
Anion gap: 7 (ref 5–15)
BUN: 33 mg/dL — ABNORMAL HIGH (ref 8–23)
CO2: 20 mmol/L — ABNORMAL LOW (ref 22–32)
Calcium: 9.1 mg/dL (ref 8.9–10.3)
Chloride: 113 mmol/L — ABNORMAL HIGH (ref 98–111)
Creatinine, Ser: 2.06 mg/dL — ABNORMAL HIGH (ref 0.61–1.24)
GFR, Estimated: 30 mL/min — ABNORMAL LOW (ref >60)
Glucose, Bld: 90 mg/dL (ref 70–99)
Potassium: 3.9 mmol/L (ref 3.5–5.1)
Sodium: 140 mmol/L (ref 135–145)

## 2022-04-20 LAB — CBC
HCT: 39.1 % (ref 39.0–52.0)
Hemoglobin: 12.8 g/dL — ABNORMAL LOW (ref 13.0–17.0)
MCH: 31.1 pg (ref 26.0–34.0)
MCHC: 32.7 g/dL (ref 30.0–36.0)
MCV: 94.9 fL (ref 80.0–100.0)
Platelets: 179 10*3/uL (ref 150–400)
RBC: 4.12 MIL/uL — ABNORMAL LOW (ref 4.22–5.81)
RDW: 13.9 % (ref 11.5–15.5)
WBC: 7.7 10*3/uL (ref 4.0–10.5)
nRBC: 0 % (ref 0.0–0.2)

## 2022-04-20 SURGERY — BALLOON DILATION
Anesthesia: General | Site: Urethra

## 2022-04-20 MED ORDER — ORAL CARE MOUTH RINSE: 15.0000 mL | Freq: Once | OROMUCOSAL | Status: AC

## 2022-04-20 MED ORDER — ACETAMINOPHEN 500 MG PO TABS
1000.0000 mg | ORAL_TABLET | Freq: Once | ORAL | Status: AC
  Administered 2022-04-20: 1000 mg via ORAL
  Filled 2022-04-20: qty 2

## 2022-04-20 MED ORDER — PROPOFOL 10 MG/ML IV BOLUS
INTRAVENOUS | Status: DC | PRN
  Administered 2022-04-20: 150 mg via INTRAVENOUS

## 2022-04-20 MED ORDER — CHLORHEXIDINE GLUCONATE 0.12 % MT SOLN
15.0000 mL | Freq: Once | OROMUCOSAL | Status: AC
  Administered 2022-04-20: 15 mL via OROMUCOSAL

## 2022-04-20 MED ORDER — LIDOCAINE 2% (20 MG/ML) 5 ML SYRINGE
INTRAMUSCULAR | Status: DC | PRN
  Administered 2022-04-20: 60 mg via INTRAVENOUS

## 2022-04-20 MED ORDER — CEFAZOLIN SODIUM-DEXTROSE 2-4 GM/100ML-% IV SOLN
2.0000 g | INTRAVENOUS | Status: AC
  Administered 2022-04-20: 2 g via INTRAVENOUS

## 2022-04-20 MED ORDER — LIDOCAINE HCL (PF) 2 % IJ SOLN
INTRAMUSCULAR | Status: AC
  Filled 2022-04-20: qty 5

## 2022-04-20 MED ORDER — AMISULPRIDE (ANTIEMETIC) 5 MG/2ML IV SOLN: 10.0000 mg | Freq: Once | INTRAVENOUS | Status: DC | PRN

## 2022-04-20 MED ORDER — 0.9 % SODIUM CHLORIDE (POUR BTL) OPTIME
TOPICAL | Status: DC | PRN
  Administered 2022-04-20: 1000 mL

## 2022-04-20 MED ORDER — ONDANSETRON HCL 4 MG/2ML IJ SOLN
INTRAMUSCULAR | Status: DC | PRN
  Administered 2022-04-20: 4 mg via INTRAVENOUS

## 2022-04-20 MED ORDER — GLYCOPYRROLATE 0.2 MG/ML IJ SOLN
INTRAMUSCULAR | Status: DC | PRN
  Administered 2022-04-20: .2 mg via INTRAVENOUS

## 2022-04-20 MED ORDER — STERILE WATER FOR IRRIGATION IR SOLN
Status: DC | PRN
  Administered 2022-04-20: 1000 mL

## 2022-04-20 MED ORDER — IOHEXOL 300 MG/ML  SOLN
INTRAMUSCULAR | Status: DC | PRN
  Administered 2022-04-20: 50 mL

## 2022-04-20 MED ORDER — CEFAZOLIN SODIUM-DEXTROSE 2-4 GM/100ML-% IV SOLN
INTRAVENOUS | Status: AC
  Filled 2022-04-20: qty 100

## 2022-04-20 MED ORDER — ONDANSETRON HCL 4 MG/2ML IJ SOLN: 4.0000 mg | Freq: Once | INTRAMUSCULAR | Status: DC | PRN

## 2022-04-20 MED ORDER — ONDANSETRON HCL 4 MG/2ML IJ SOLN
INTRAMUSCULAR | Status: AC
  Filled 2022-04-20: qty 2

## 2022-04-20 MED ORDER — DIATRIZOATE MEGLUMINE 30 % UR SOLN
URETHRAL | Status: AC
  Filled 2022-04-20: qty 100

## 2022-04-20 MED ORDER — SODIUM CHLORIDE 0.9 % IR SOLN
Status: DC | PRN
  Administered 2022-04-20: 3000 mL

## 2022-04-20 MED ORDER — DEXAMETHASONE SODIUM PHOSPHATE 10 MG/ML IJ SOLN
INTRAMUSCULAR | Status: DC | PRN
  Administered 2022-04-20: 8 mg via INTRAVENOUS

## 2022-04-20 MED ORDER — FENTANYL CITRATE (PF) 100 MCG/2ML IJ SOLN
INTRAMUSCULAR | Status: AC
  Filled 2022-04-20: qty 2

## 2022-04-20 MED ORDER — DEXAMETHASONE SODIUM PHOSPHATE 10 MG/ML IJ SOLN
INTRAMUSCULAR | Status: AC
  Filled 2022-04-20: qty 1

## 2022-04-20 MED ORDER — LACTATED RINGERS IV SOLN: INTRAVENOUS | Status: DC

## 2022-04-20 MED ORDER — FENTANYL CITRATE PF 50 MCG/ML IJ SOSY: 25.0000 ug | PREFILLED_SYRINGE | INTRAMUSCULAR | Status: DC | PRN

## 2022-04-20 MED ORDER — PROPOFOL 10 MG/ML IV BOLUS
INTRAVENOUS | Status: AC
  Filled 2022-04-20: qty 20

## 2022-04-20 SURGICAL SUPPLY — 22 items
BAG URINE DRAIN 2000ML AR STRL (UROLOGICAL SUPPLIES) ×1 IMPLANT
BALLN NEPHROSTOMY (BALLOONS)
BALLN OPTILUME DCB 30X5X75 (BALLOONS) ×1
BALLOON NEPHROSTOMY (BALLOONS) IMPLANT
BALLOON OPTILUME DCB 30X5X75 (BALLOONS) IMPLANT
CATH FOLEY 2W COUNCIL 20FR 5CC (CATHETERS) IMPLANT
CATH ROBINSON RED A/P 14FR (CATHETERS) IMPLANT
CATH URET 5FR 28IN CONE TIP (BALLOONS)
CATH URET 5FR 70CM CONE TIP (BALLOONS) IMPLANT
CATH URETL OPEN END 6FR 70 (CATHETERS) IMPLANT
CLOTH BEACON ORANGE TIMEOUT ST (SAFETY) ×1 IMPLANT
GLOVE BIO SURGEON STRL SZ7.5 (GLOVE) ×1 IMPLANT
GOWN STRL REUS W/ TWL XL LVL3 (GOWN DISPOSABLE) ×1 IMPLANT
GOWN STRL REUS W/TWL XL LVL3 (GOWN DISPOSABLE) ×1
GUIDEWIRE ANG ZIPWIRE 038X150 (WIRE) IMPLANT
GUIDEWIRE STR DUAL SENSOR (WIRE) IMPLANT
KIT TURNOVER KIT A (KITS) IMPLANT
MANIFOLD NEPTUNE II (INSTRUMENTS) IMPLANT
NS IRRIG 1000ML POUR BTL (IV SOLUTION) IMPLANT
PACK CYSTO (CUSTOM PROCEDURE TRAY) ×1 IMPLANT
PENCIL SMOKE EVACUATOR (MISCELLANEOUS) IMPLANT
WATER STERILE IRR 3000ML UROMA (IV SOLUTION) ×1 IMPLANT

## 2022-04-20 NOTE — Anesthesia Procedure Notes (Signed)
Procedure Name: LMA Insertion Date/Time: 04/20/2022 11:08 AM  Performed by: Victoriano Lain, CRNAPre-anesthesia Checklist: Patient identified, Emergency Drugs available, Suction available, Patient being monitored and Timeout performed Patient Re-evaluated:Patient Re-evaluated prior to induction Oxygen Delivery Method: Circle system utilized Preoxygenation: Pre-oxygenation with 100% oxygen Induction Type: IV induction LMA: LMA with gastric port inserted LMA Size: 4.0 Number of attempts: 1 Placement Confirmation: positive ETCO2 and breath sounds checked- equal and bilateral Tube secured with: Tape Dental Injury: Teeth and Oropharynx as per pre-operative assessment

## 2022-04-20 NOTE — Transfer of Care (Signed)
Immediate Anesthesia Transfer of Care Note  Patient: Robert Bright  Procedure(s) Performed: BALLOON DILATION, CYSTO optilume (Urethra)  Patient Location: PACU  Anesthesia Type:General  Level of Consciousness: awake, alert , oriented, and patient cooperative  Airway & Oxygen Therapy: Patient Spontanous Breathing and Patient connected to face mask oxygen  Post-op Assessment: Report given to RN, Post -op Vital signs reviewed and stable, and Patient moving all extremities  Post vital signs: Reviewed and stable  Last Vitals:  Vitals Value Taken Time  BP 132/73 04/20/22 1147  Temp    Pulse 41 04/20/22 1150  Resp 15 04/20/22 1150  SpO2 100 % 04/20/22 1150  Vitals shown include unvalidated device data.  Last Pain:  Vitals:   04/20/22 0956  TempSrc:   PainSc: 0-No pain         Complications: No notable events documented.

## 2022-04-20 NOTE — Op Note (Signed)
Preoperative diagnosis: Bulbar urethral stricture  Postoperative diagnosis: Bulbar urethral stricture  Procedure: Cystoscopy with Optilume balloon dilation of urethral stricture with retrograde urethrogram  Surgeon: Pryor Curia MD  Anesthesia: General  Complications: None  Specimens: None  Intraoperative findings: A retrograde urethrogram was performed with Omnipaque contrast.  This demonstrated a short less than 1 cm bulbar urethral stricture without other abnormalities.  Indication: Robert Bright is an 88 year old gentleman with a history of a bulbar urethral stricture who underwent standard balloon dilation approximately 3 months ago.  The stricture recurred and prevented him from undergoing surveillance cystoscopy in the office for follow-up of his bladder cancer.  This was unable to be dilated in the office and he was therefore scheduled for cystoscopy with balloon dilation utilizing the Optilume balloon to try to ensure a more durable response.  The potential risks, complications, and the expected recovery process was discussed in detail.  Informed consent was obtained.  Description of procedure: The patient was taken the operating room and a general anesthetic was administered.  He was given preoperative antibiotics, placed in the dorsolithotomy position, and prepped and draped in usual sterile fashion.  A preoperative timeout was performed.  Cystourethroscopy was performed.  This revealed a pinpoint bulbar urethral stricture.  Omnipaque contrast was then injected under fluoroscopy and revealed a less than 1 cm bulbar urethral stricture without other abnormalities.  A 0.38 sensor guidewire was then advanced through the pinpoint opening and curled into the bladder under fluoroscopy.  The Optilume balloon was then inserted over the wire under fluoroscopic guidance and positioned across the urethral stricture.  This was then inflated to 10 atm of pressure.  All waste was seen to have  been removed from the balloon.  This was left inflated for 5 minutes.  The balloon was then deflated and repeat cystoscopy revealed the stricture to be opened allowing the 60 French rigid cystoscope to bypass this without difficulty.  The bladder was then examined systematically and there were no evidence of any recurrent bladder tumors or other abnormalities.  A 20 French Foley catheter was then inserted into the bladder without resistance or difficulty and left indwelling.  The patient tolerated the procedure well without complications.  He was able to be awakened and transferred to recovery unit in satisfactory condition.

## 2022-04-20 NOTE — Interval H&P Note (Signed)
History and Physical Interval Note:  04/20/2022 10:03 AM  Robert Bright  has presented today for surgery, with the diagnosis of urethral stricture.  The various methods of treatment have been discussed with the patient and family. After consideration of risks, benefits and other options for treatment, the patient has consented to  Procedure(s): BALLOON DILATION, CYSTO (N/A) as a surgical intervention.  The patient's history has been reviewed, patient examined, no change in status, stable for surgery.  I have reviewed the patient's chart and labs.  Questions were answered to the patient's satisfaction.     [employer]

## 2022-04-20 NOTE — Discharge Instructions (Addendum)

## 2022-04-20 NOTE — Anesthesia Preprocedure Evaluation (Addendum)
Anesthesia Evaluation  Patient identified by MRN, date of birth, ID band Patient awake    Reviewed: Allergy & Precautions, NPO status , Patient's Chart, lab work & pertinent test results  Airway Mallampati: II  TM Distance: >3 FB Neck ROM: Full    Dental  (+) Missing   Pulmonary former smoker   Pulmonary exam normal        Cardiovascular hypertension, Pt. on medications Normal cardiovascular exam     Neuro/Psych   Anxiety      Neuromuscular disease    GI/Hepatic negative GI ROS, Neg liver ROS,,,  Endo/Other  negative endocrine ROS    Renal/GU Renal disease     Musculoskeletal  (+) Arthritis ,    Abdominal   Peds  Hematology negative hematology ROS (+)   Anesthesia Other Findings urethral stricture  Reproductive/Obstetrics                             Anesthesia Physical Anesthesia Plan  ASA: 2  Anesthesia Plan: General   Post-op Pain Management:    Induction: Intravenous  PONV Risk Score and Plan: 2 and Ondansetron, Dexamethasone and Treatment may vary due to age or medical condition  Airway Management Planned: LMA  Additional Equipment:   Intra-op Plan:   Post-operative Plan: Extubation in OR  Informed Consent: I have reviewed the patients History and Physical, chart, labs and discussed the procedure including the risks, benefits and alternatives for the proposed anesthesia with the patient or authorized representative who has indicated his/her understanding and acceptance.     Dental advisory given  Plan Discussed with: CRNA  Anesthesia Plan Comments:        Anesthesia Quick Evaluation

## 2022-04-20 NOTE — Anesthesia Postprocedure Evaluation (Signed)
Anesthesia Post Note  Patient: Robert Bright  Procedure(s) Performed: BALLOON DILATION, CYSTO optilume (Urethra)     Patient location during evaluation: PACU Anesthesia Type: General Level of consciousness: awake Pain management: pain level controlled Vital Signs Assessment: post-procedure vital signs reviewed and stable Respiratory status: spontaneous breathing, nonlabored ventilation, respiratory function stable and patient connected to nasal cannula oxygen Cardiovascular status: blood pressure returned to baseline and stable Postop Assessment: no apparent nausea or vomiting Anesthetic complications: no   No notable events documented.  Last Vitals:  Vitals:   04/20/22 1215 04/20/22 1230  BP: (!) 145/69 (!) 164/87  Pulse: (!) 58 (!) 44  Resp: 16 20  Temp: (!) 36.3 C (!) 36.4 C  SpO2: 99% 98%    Last Pain:  Vitals:   04/20/22 1230  TempSrc:   PainSc: 0-No pain                 Raymonde Hamblin P Mantaj Chamberlin

## 2022-04-20 NOTE — OR Nursing (Addendum)
Gave patient a leg bag at his request and explained that he should use the larger drainage bag but he insisted on only using a leg bag. Educated patient on switching bags and warned on the risk of getting an infection if the urine is not able to drain correctly. Pt repeatedly insisted on only wearing the leg bag. Standard drainage bag sent home with patient in case he changes his mind and decides to use it.   Delay in discharge due to patient asking repeat questions during foley education.

## 2022-04-21 ENCOUNTER — Encounter (HOSPITAL_COMMUNITY): Payer: Self-pay | Admitting: Urology

## 2022-04-28 ENCOUNTER — Encounter (HOSPITAL_COMMUNITY): Payer: Self-pay | Admitting: Cardiovascular Disease

## 2022-05-21 ENCOUNTER — Encounter: Payer: Self-pay | Admitting: Cardiovascular Disease

## 2022-05-21 NOTE — Progress Notes (Unsigned)
Cardiology Office Note   Date:  05/22/2022   ID:  Robert Bright, DOB 12/22/1930, MRN [id number]  PCP:  Collene Leyden, MD  Cardiologist:  Previous Darlin Coco MD,  Now Nijee Heatwole   Chief Complaint  Patient presents with   Hypertension           Notes from Dr. Liborio Nixon is a 88 y.o. male who presents for scheduled 6 month follow-up visit.  He is a retired IT trainer.Robert Bright He has a history of essential hypertension and a history of hypercholesterolemia. He does not have any history of chest pain or ischemic heart disease. He had a normal nuclear stress test in 2004. He does have a past history of prostate cancer and a more recent history of bladder cancer. [name] is his urologist.  Since last visit he has been feeling well .  When we last saw him he [place].  However he states that the events to many doctors visits scheduled that they never got to the beach.  He is disappointed in that. From the cardiac standpoint he has not been experiencing chest pain or shortness of breath.  We reviewed his lab work which is satisfactory.  January 04, 2016:  Seen for the first time today .  Transfer from 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 31, 2018:   Robert Bright is seen back today for his HTN and hyperlipidemia .    Had a skin cancer removed from his nose .  Also has had a bladder cancer removed.   April 17, 2018:  Robert Bright is seen today ( with daughter, [name])   for follow-up of his hypertension and hyperlipidemia. He was seen by Vin  in May, 2019. Has been seen by Dr. Johnney Ou ( nephrology )  She stopped the Diovan several weeks ago . BP has been a bit elevated ( by his BP cuff)   Eats at [employer] almost every day  Still eats a very salty diet.  Eats [employer] sandwiches 3-4 times a week.  He has chicken noodle soup almost every evening.  Nov. 24, 2020 Seen with daughter Robert Bright is seen today for follow-up of his hypertension and hyperlipidemia.  He also has chronic kidney disease.  I saw him several years ago.    Duaghter is [name] .   Is a [employer] graduate .   We discuss the [employer]  / [employer] rivalry .    Nov. 29, 2021: Robert Bright is seen today for follow up of his HTN and HLD ,  Seen with daughter  , [name] and wife.  Is a [employer] grad.   [employer] beat [employer] [employer] 45-0 this past weekend.  Walks occasionally   Nov. 28, 2022 Robert Bright is seen today for folllow up of his HTN and HLD . Is a [employer] grad.   [employer] beat [place] [employer]( 37-14)  this past weekend No CP , no dyspnea.  Has some leg swelling  Legs ache on occasion    Dec. 4, 2023   Robert Bright is seen today for follow of HLD,  Robert Bright,  he discuss the [place] -  [employer] rivalry  [employer] lost to [place] .   No CP ,  Minimal leg swelling  Has had several urology procedure [name], MD )  Hx of HTN    Past Medical History:  Diagnosis Date   Anxiety    Arthritis    Bladder cancer [facility]) 1993   Being followed by [name]   ED (erectile dysfunction)    GERD (gastroesophageal reflux disease)    pt. denies   Heart murmur    Hypercholesterolemia    Hyperlipidemia    Hypertension    with a component of whitecoat syndrome   Prostate cancer ([place]) 1993   Hx ostatectomyof remote treated with radial pr   Shingles    Stress incontinence    Ureteral stricture    Vertigo     Past Surgical History:  Procedure Laterality Date   BALLOON DILATION N/A 04/20/2022   Procedure: BALLOON DILATION, CYSTO optilume;  Surgeon: Raynelle Bring, MD;  Location: [facility];  Service: Urology;  Laterality: N/A;   BLADDER TUMOR EXCISION     CARDIOVASCULAR STRESS TEST  01/02/2003   EF 68%   COLONOSCOPY      CYSTOSCOPY     CYSTOSCOPY W/ RETROGRADES Bilateral 04/24/2016   Procedure: CYSTOSCOPY WITH BILATERAL RETROGRADE PYELOGRAM;  Surgeon: Raynelle Bring, MD;  Location: [facility];  Service: Urology;  Laterality: Bilateral;   CYSTOSCOPY WITH URETHRAL DILATATION N/A 01/19/2022   Procedure: CYSTOSCOPY WITH  BALLOON URETHRAL DILATATION;  Surgeon: Raynelle Bring, MD;  Location: [facility];  Service: Urology;  Laterality: N/A;   CYSTOSTOMY W/ BLADDER BIOPSY     EYE SURGERY     cataract   INGUINAL HERNIA REPAIR     LYMPHADENECTOMY     PROSTATECTOMY  06/20/1991   TRANSURETHRAL RESECTION OF BLADDER TUMOR N/A 04/24/2016   Procedure: TRANSURETHRAL RESECTION OF BLADDER TUMOR (TURBT);  Surgeon: Raynelle Bring, MD;  Location: [facility];  Service: Urology;  Laterality: N/A;     Current Outpatient Medications  Medication Sig Dispense Refill   acetaminophen (TYLENOL) 500 MG tablet Take 1,000 mg by mouth every 6 (six) hours as needed for mild pain.     amLODipine (NORVASC) 5 MG tablet Take 1 tablet (5 mg total) by mouth daily. 90 tablet 3   atorvastatin (LIPITOR) 20 MG tablet Take 1 tablet (20 mg total) by mouth daily. 90 tablet 3   desonide (DESOWEN) 0.05 % cream Apply 1 Application topically 2 (two) times daily as needed (irritation).     furosemide (LASIX) 20 MG tablet Take 1 tablet (20 mg total) by mouth daily. 90 tablet 3   LORazepam (ATIVAN) 0.5 MG tablet TAKE 1 TABLET BY MOUTH EVERY DAY AS NEEDED FOR ANXIETY 30 tablet 3   melatonin 5 MG TABS Take 5 mg by mouth at bedtime as needed (sleep).     Multiple Vitamins-Minerals (MULTIVITAMIN WITH MINERALS) tablet Take 1 tablet by mouth daily.     potassium chloride SA (KLOR-CON M) 20 MEQ tablet Take 20 mEq by mouth daily.     vitamin B-12 (CYANOCOBALAMIN) 100 MCG tablet Take 100 mcg by mouth daily.     No current facility-administered medications for this visit.    Allergies:   Altace [ramipril]    Social History:  The patient  reports that he quit smoking about 38  years ago. His smoking use included cigars. He has never used smokeless tobacco. He reports that he does not drink alcohol and does not use drugs.   Family History:  The patient's family history includes Arthritis/Rheumatoid in his father; Dementia in his mother; Healthy in his sister and sister.    ROS:  Please see the history of present illness.   Otherwise, review of systems are positive for none.   All other systems are reviewed and negative.    Physical Exam: Blood pressure (!) 145/80, pulse 78, height '5\' 10"'$  (1.778 m), weight 164 lb 6.4 oz (74.6 kg).  HYPERTENSION CONTROL Vitals:   05/22/22 1029 05/22/22 1710  BP: (!) 150/80 (!) 145/80    The patient's blood pressure is elevated above target today.  In order to address the patient's elevated BP: A current anti-hypertensive medication was adjusted today.       GEN:  Well nourished, well developed in no acute distress HEENT: Normal NECK: No JVD; No carotid bruits LYMPHATICS: No lymphadenopathy CARDIAC: RRR , no murmurs, rubs, gallops RESPIRATORY:  Clear to auscultation without rales, wheezing or rhonchi  ABDOMEN: Soft, non-tender, non-distended MUSCULOSKELETAL:  No edema; No deformity  SKIN: Warm and dry NEUROLOGIC:  Alert and oriented x 3   EKG:      Recent Labs: 08/25/2021: TSH 1.58 08/30/2021: ALT <3 04/20/2022: BUN 33; Creatinine, Ser 2.06; Hemoglobin 12.8; Platelets 179; Potassium 3.9; Sodium 140    Lipid Panel    Component Value Date/Time   CHOL 137 04/01/2021 1136   CHOL 255 (H) 10/17/2017 1101   TRIG 97 04/01/2021 1136   HDL 54 04/01/2021 1136   HDL 96 10/17/2017 1101   CHOLHDL 2.5 04/01/2021 1136   VLDL 25 06/26/2016 1113   LDLCALC 65 04/01/2021 1136      Wt Readings from Last 3 Encounters:  05/22/22 164 lb 6.4 oz (74.6 kg)  04/20/22 171 lb 15.3 oz (78 kg)  04/18/22 172 lb (78 kg)        ASSESSMENT AND PLAN:  1. essential hypertension:  BP remains elevated.   Will increase amlodipine to 5  mg a day .  Cont to watch salt   2. Hyperlipidemia -   lipids have been stable    3. right bundle branch block  ;      4. Sinus bradycardia:       Current medicines are reviewed at length with the patient today.  The patient does not have concerns regarding medicines.  The following changes have been made:  no change  Labs/ tests ordered today include:   No orders of the defined types were placed in this encounter.    To see an APP in 6 months ,  I will see him  in a year    Mertie Moores, MD  05/22/2022 5:11 PM    [address],  [place], [place]  [address] Pager [phone] Phone: [phone]; Fax: [phone]

## 2022-05-22 ENCOUNTER — Encounter: Payer: Self-pay | Admitting: Cardiovascular Disease

## 2022-05-22 ENCOUNTER — Ambulatory Visit: Payer: Medicare Other | Attending: Cardiovascular Disease | Admitting: Cardiovascular Disease

## 2022-05-22 VITALS — BP 145/80 | HR 78 | Ht 70.0 in | Wt 164.4 lb

## 2022-05-22 DIAGNOSIS — I10 Essential (primary) hypertension: Secondary | ICD-10-CM

## 2022-05-22 DIAGNOSIS — N189 Chronic kidney disease, unspecified: Secondary | ICD-10-CM | POA: Diagnosis not present

## 2022-05-22 MED ORDER — AMLODIPINE BESYLATE 5 MG PO TABS: 5.0000 mg | ORAL_TABLET | Freq: Every day | ORAL | 3 refills | Status: DC

## 2022-05-22 NOTE — Patient Instructions (Signed)
Medication Instructions:  INCREASE Amlodipine to '5mg'$  daily *If you need a refill on your cardiac medications before your next appointment, please call your pharmacy*   Lab Work: NONE If you have labs (blood work) drawn today and your tests are completely normal, you will receive your results only by: [place] (if you have MyChart) OR A paper copy in the mail If you have any lab test that is abnormal or we need to change your treatment, we will call you to review the results.   Testing/Procedures: NONE   Follow-Up: At [facility], you and your health needs are our priority.  As part of our continuing mission to provide you with exceptional heart care, we have created designated Provider Care Teams.  These Care Teams include your primary Cardiologist (physician) and Advanced Practice Providers (APPs -  Physician Assistants and Nurse Practitioners) who all work together to provide you with the care you need, when you need it.  We recommend signing up for the patient portal called "MyChart".  Sign up information is provided on this After Visit Summary.  MyChart is used to connect with patients for Virtual Visits (Telemedicine).  Patients are able to view lab/test results, encounter notes, upcoming appointments, etc.  Non-urgent messages can be sent to your provider as well.   To learn more about what you can do with MyChart, go to [email or web address].    Your next appointment:   6 month(s)  The format for your next appointment:   In Person  Provider:   Lauretta Grill, or [facility]      Important Information About Sugar

## 2022-06-16 ENCOUNTER — Ambulatory Visit (HOSPITAL_COMMUNITY): Payer: Medicare Other | Attending: Internal Medicine

## 2022-06-16 DIAGNOSIS — I77819 Aortic ectasia, unspecified site: Secondary | ICD-10-CM | POA: Diagnosis present

## 2022-06-16 DIAGNOSIS — I351 Nonrheumatic aortic (valve) insufficiency: Secondary | ICD-10-CM | POA: Insufficient documentation

## 2022-06-16 LAB — ECHOCARDIOGRAM COMPLETE
Area-P 1/2: 3.68 cm2
P 1/2 time: 520 msec
S' Lateral: 3.2 cm

## 2022-07-17 ENCOUNTER — Other Ambulatory Visit: Payer: Self-pay | Admitting: *Deleted

## 2022-07-17 MED ORDER — POTASSIUM CHLORIDE CRYS ER 20 MEQ PO TBCR: 20.0000 meq | EXTENDED_RELEASE_TABLET | Freq: Every day | ORAL | 2 refills | Status: DC

## 2022-08-02 ENCOUNTER — Other Ambulatory Visit: Payer: Self-pay

## 2022-08-02 MED ORDER — ATORVASTATIN CALCIUM 20 MG PO TABS: 20.0000 mg | ORAL_TABLET | Freq: Every day | ORAL | 3 refills | Status: DC

## 2022-10-17 LAB — LAB REPORT - SCANNED: EGFR: 30

## 2023-03-02 IMAGING — CT CT ABD-PELV W/O CM
2 of 4 series · 15 of 46 positions shown, 17 images · non-contrast
Comparison: 05/23/2006

CLINICAL DATA: Suprapubic tenderness.  Dysuria for 2 days.



[Series 2: abd pel wo · axial · 0.81mm/px · z∈[-472,-32]mm · 12 of 98 slices shown, 14 images]
[im 5/98  soft-tissue]
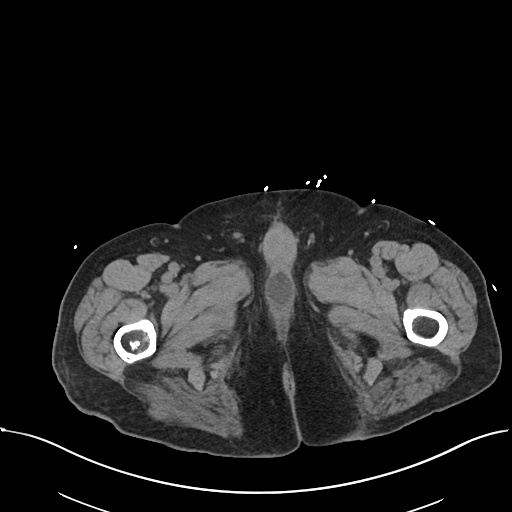
[im 5/98  bone]
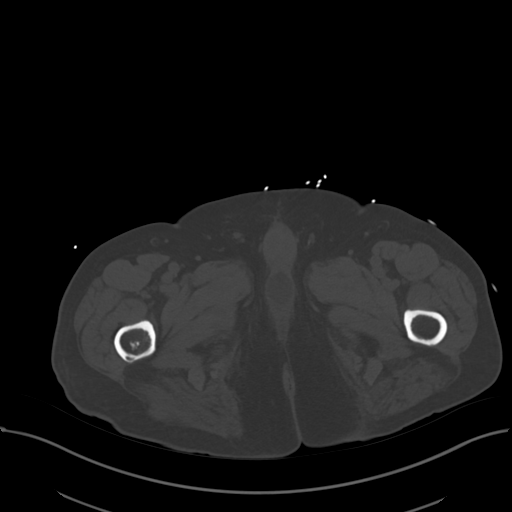
[im 13/98  soft-tissue]
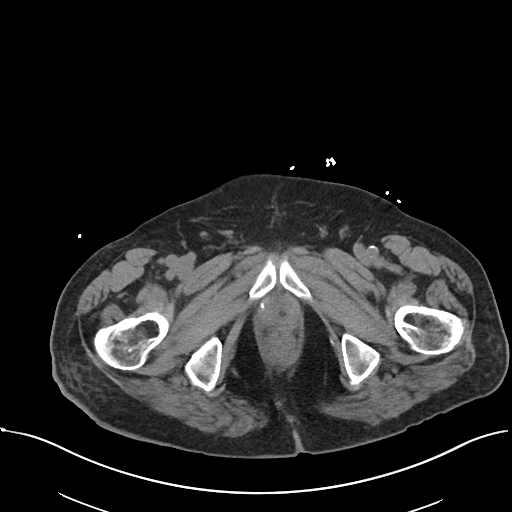
[im 21/98  soft-tissue]
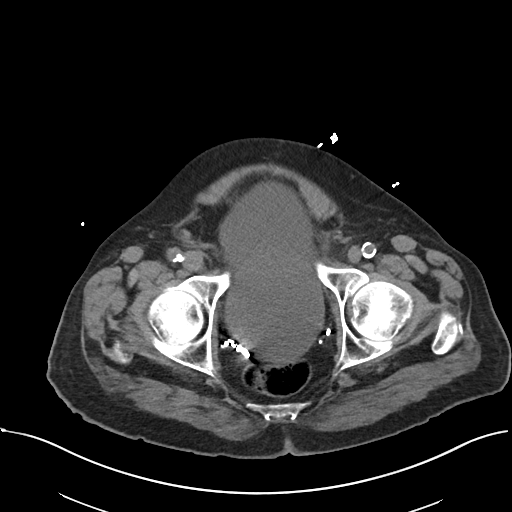
[im 29/98  soft-tissue]
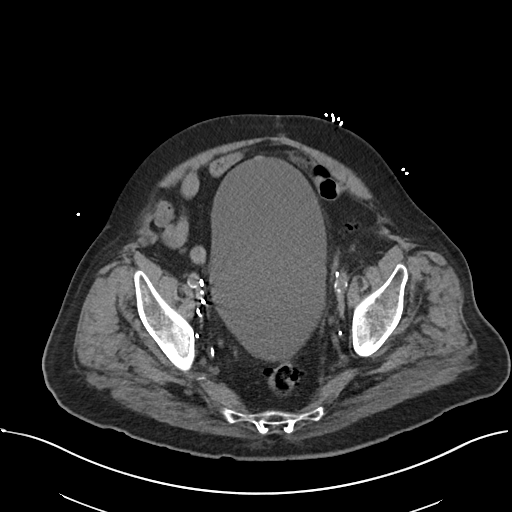
[im 37/98  soft-tissue]
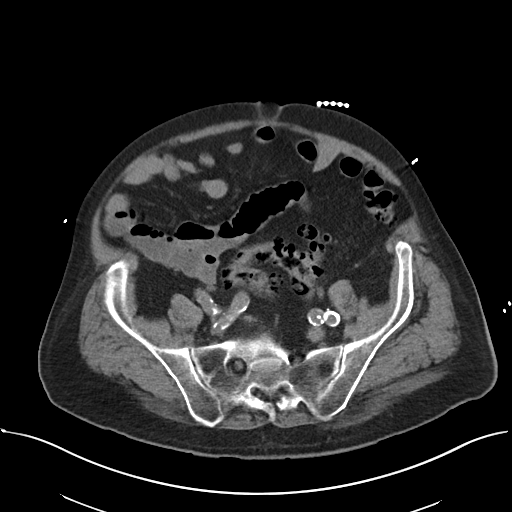
[im 45/98  soft-tissue]
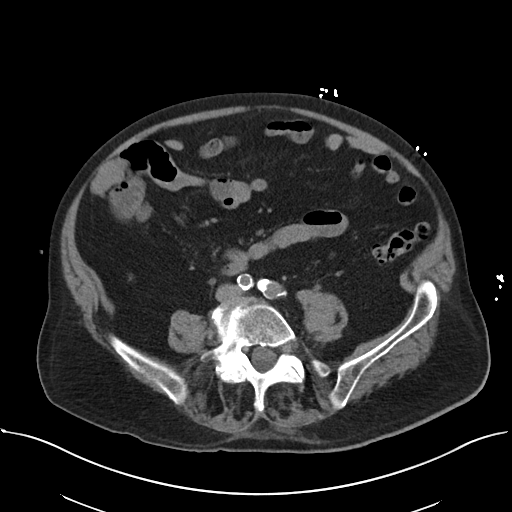
[im 53/98  soft-tissue]
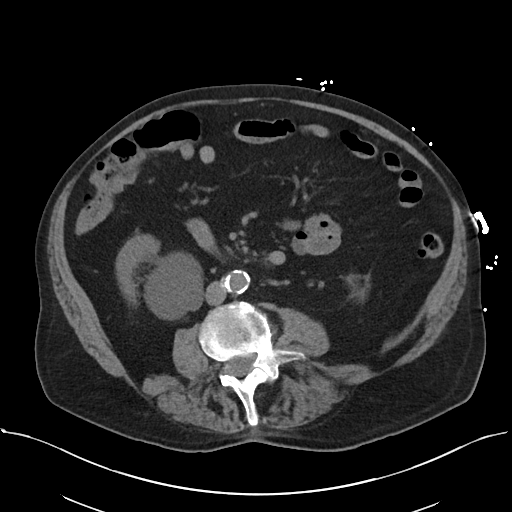
[im 61/98  soft-tissue]
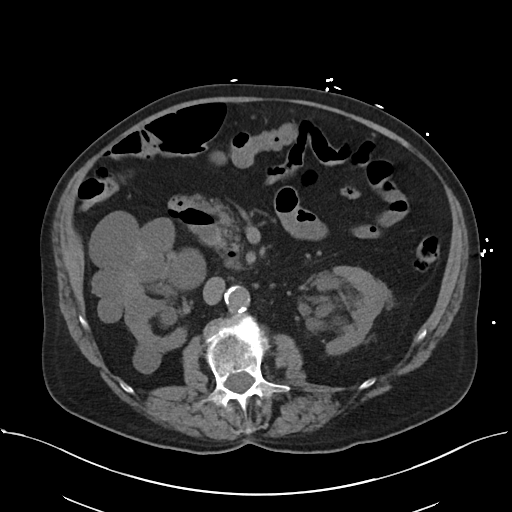
[im 69/98  soft-tissue]
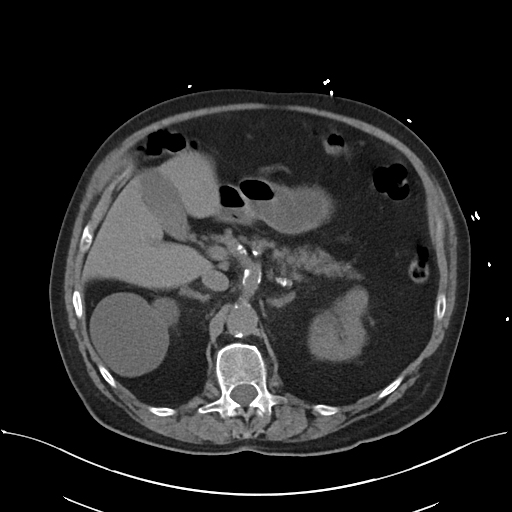
[im 69/98  bone]
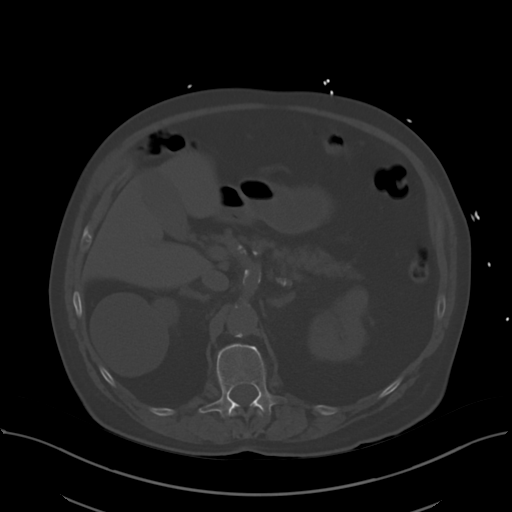
[im 77/98  soft-tissue]
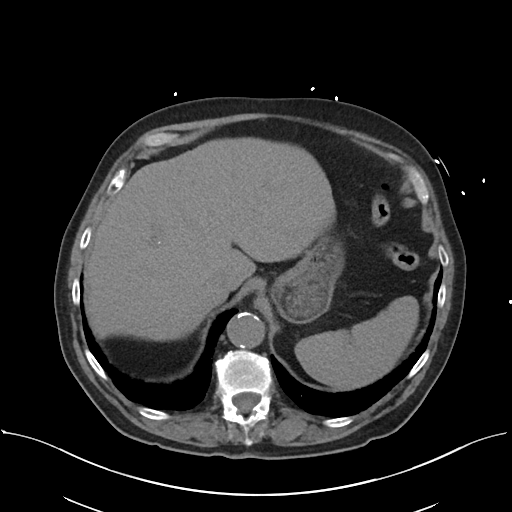
[im 85/98  soft-tissue]
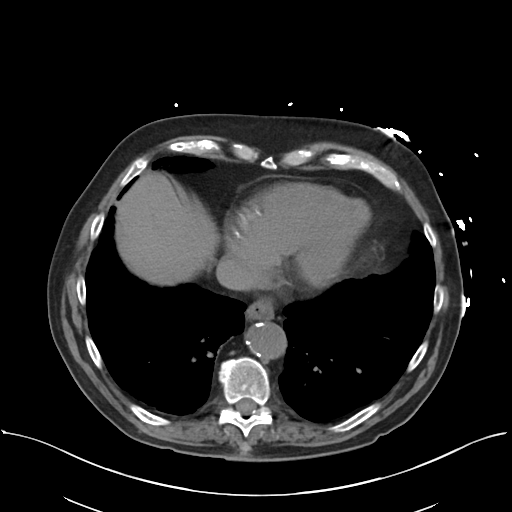
[im 93/98  soft-tissue]
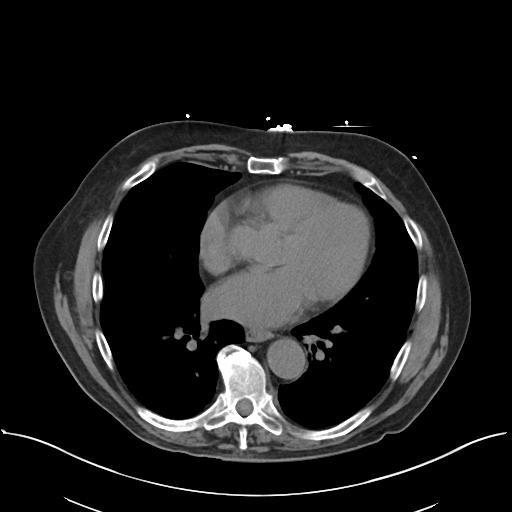

[Series 5: coronal · coronal · 0.75mm/px · 3 of 105 slices shown]
[im 35/105  soft-tissue]
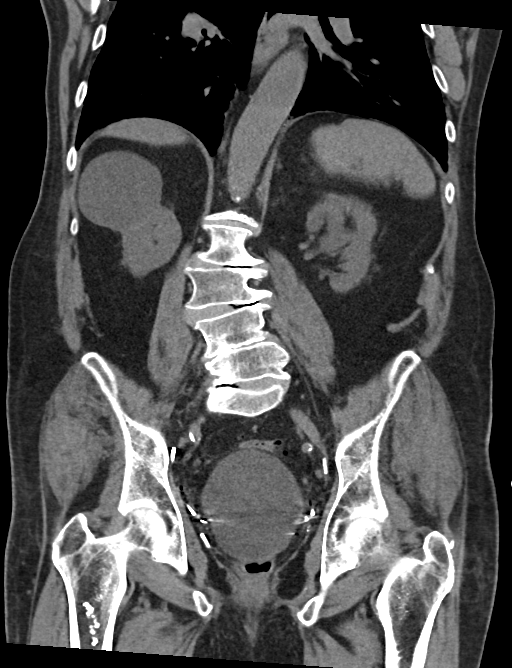
[im 47/105  soft-tissue]
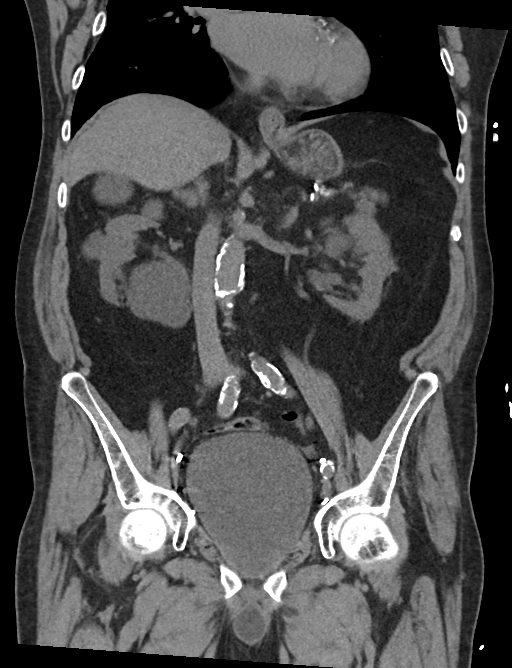
[im 58/105  soft-tissue]
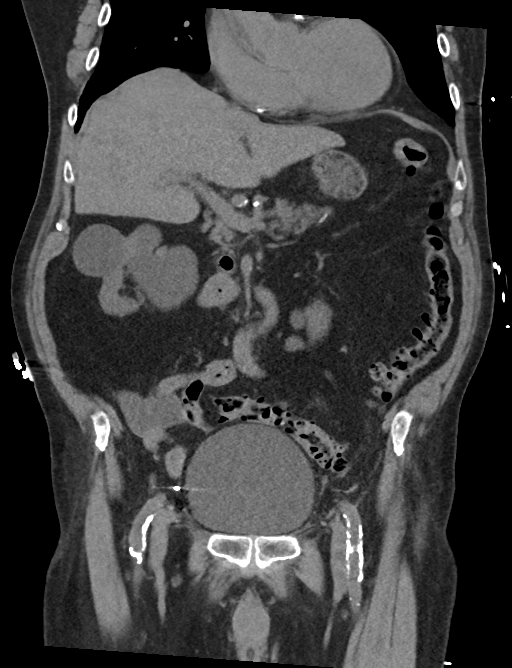

[15 of 46 positions shown; findings below may reference images not displayed]

FINDINGS: Lower chest: Minimal subsegmental atelectasis at the lung bases.
Coronary artery calcifications.

Hepatobiliary: The gallbladder is present.  No focal liver lesion.

Pancreas: Unremarkable. No pancreatic ductal dilatation or
surrounding inflammatory changes.

Spleen: Normal in size without focal abnormality.

Adrenals/Urinary Tract: Adrenal glands are normal.

RIGHT kidney: There are numerous cysts throughout the RIGHT kidney,
largest proximally 6.9 centimeters. Renal parenchymal thinning is
present. There is RIGHT ureteropelvic junction obstruction. RIGHT
ureter is normal.

LEFT kidney: Numerous cysts are present. No hydronephrosis. LEFT
ureter is unremarkable.

Urinary bladder is distended. The Foley balloon is within the
proximal penile urethra. Numerous clips are identified LATERAL and
posterior to the urinary bladder.

Stomach/Bowel: Stomach is normal. Small duodenal diverticulum. Small
bowel loops are normal in appearance. There is extensive colonic
diverticulosis. No acute diverticulitis. Appendix is not well seen.
No inflammatory changes in the RIGHT LOWER QUADRANT.

Vascular/Lymphatic: There is dense atherosclerotic calcification of
the abdominal aorta. No associated aneurysm. Although involved by
atherosclerosis, there is vascular opacification of the celiac axis,
superior mesenteric artery, and inferior mesenteric artery. Normal
appearance of the portal venous system and inferior vena cava. No
retroperitoneal or mesenteric adenopathy.

Reproductive: Prior prostatectomy.

Other: Small fat containing paraumbilical hernia.  No ascites.

Musculoskeletal: Moderate degenerative changes and scoliosis of the
thoracolumbar spine. No acute abnormality.
IMPRESSION: 1. Marked distension of the urinary bladder. The Foley catheter tip
is within the prostatic urethra, balloon inflated in the posterior
penile urethra. Recommend repositioning of Foley catheter after
deflating the balloon.
2. Postoperative changes in the pelvis surrounding the urinary
bladder.
3. Polycystic kidneys. RIGHT ureteropelvic junction obstruction and
RIGHT renal parenchymal thinning.
4. Extensive colonic diverticulosis without acute diverticulitis.
5.  Aortic atherosclerosis.  ([id number])
6. Prostatectomy.

These results were called by telephone at the time of interpretation
on 08/14/2021 at [DATE] to provider MELYNDA BILLIOT , who verbally
acknowledged these results.

## 2023-03-02 IMAGING — DX DG CHEST 1V PORT
1 series · 1 of 1 positions shown · non-contrast
Comparison: Radiograph 08/17/2010

CLINICAL DATA: Incontinence Questionable sepsis - evaluate for
abnormality

EXAM:
PORTABLE CHEST 1 VIEW

[chest ap]
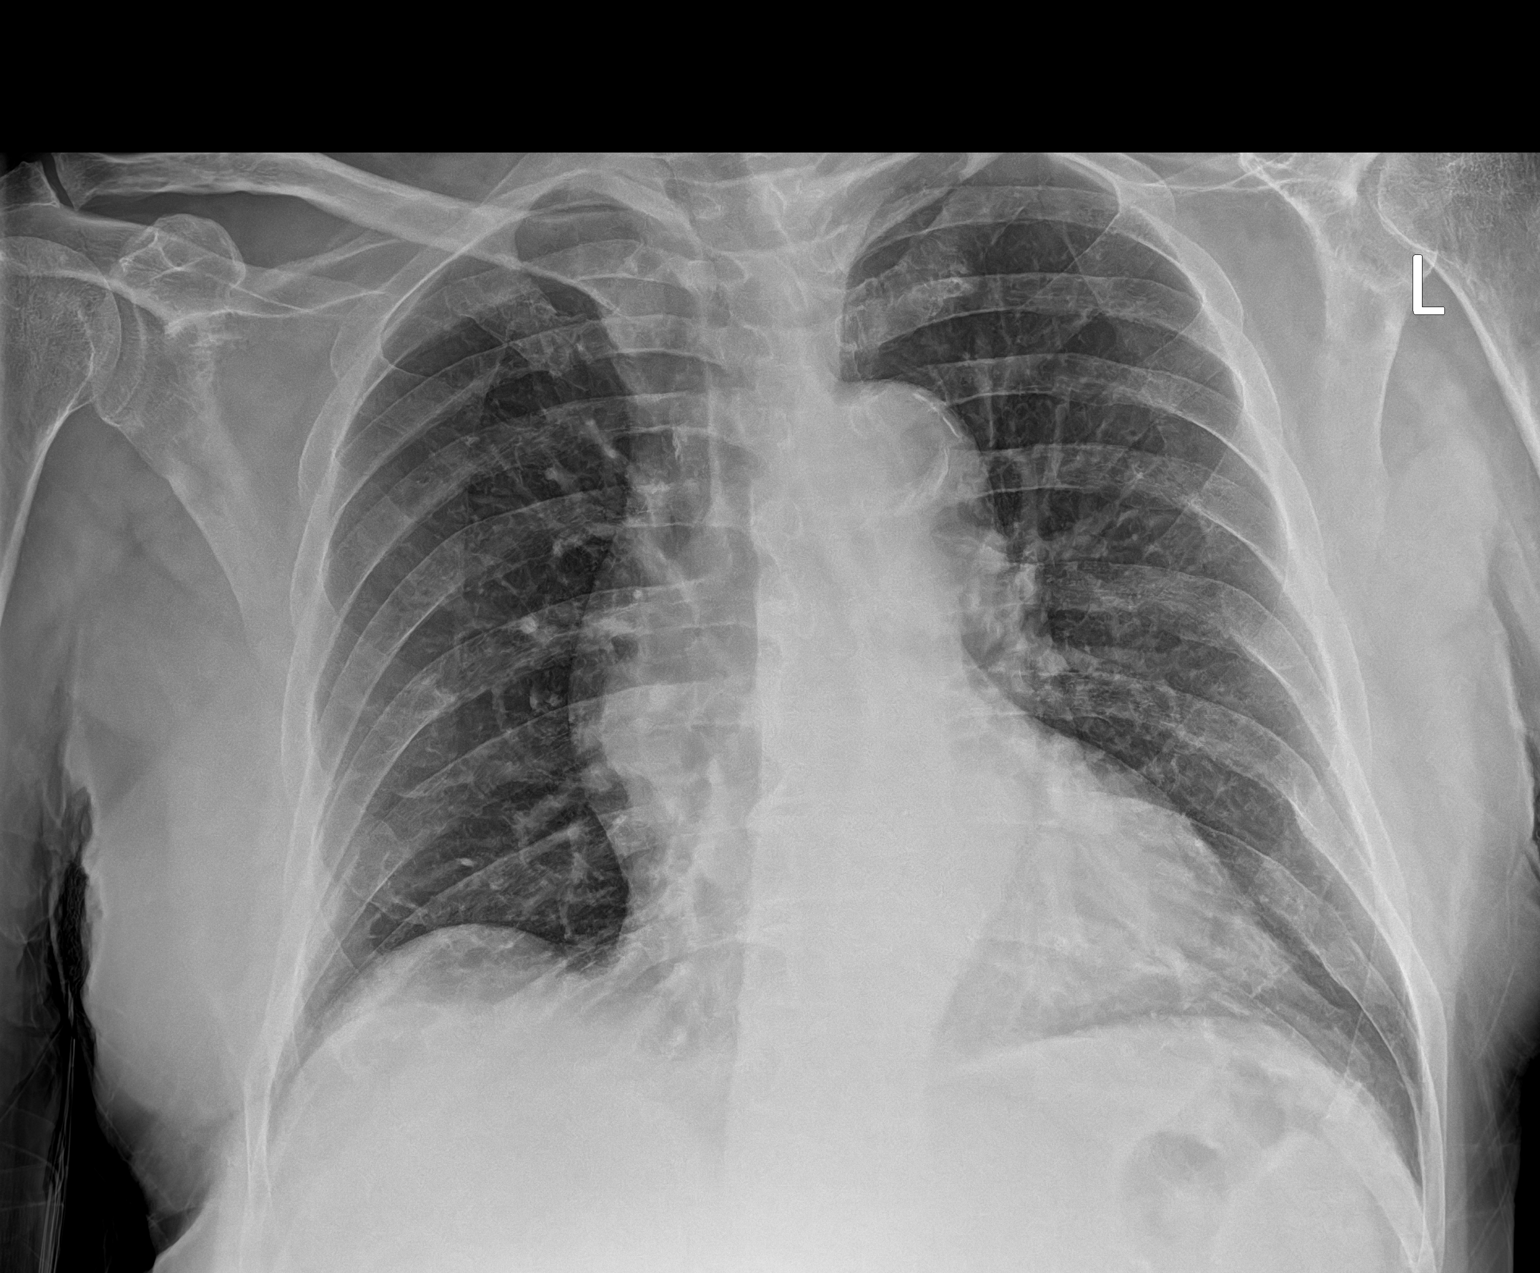

[1 of 1 positions shown; findings below may reference images not displayed]

FINDINGS: Normal cardiac silhouette with ectatic aorta. Rounded RIGHT hilar
density superimposed on the ascending thoracic aorta shadow
measuring 2 cm. Atherosclerotic calcification of the aorta. No acute
osseous abnormality.
IMPRESSION: 1. No acute cardiopulmonary findings.
2. Rounded density at the RIGHT hilum may represent vascular shadow.
Cannot exclude pulmonary nodule. Consider CT thorax without contrast

## 2023-03-28 ENCOUNTER — Other Ambulatory Visit: Payer: Self-pay | Admitting: Cardiovascular Disease

## 2023-03-28 ENCOUNTER — Telehealth: Payer: Self-pay | Admitting: Cardiovascular Disease

## 2023-03-28 MED ORDER — POTASSIUM CHLORIDE CRYS ER 20 MEQ PO TBCR: 20.0000 meq | EXTENDED_RELEASE_TABLET | Freq: Every day | ORAL | 0 refills | Status: DC

## 2023-03-28 NOTE — Telephone Encounter (Signed)
Pt's medication was sent to pt's pharmacy as requested. Confirmation received.  °

## 2023-03-28 NOTE — Telephone Encounter (Signed)
 *  STAT* If patient is at the pharmacy, call can be transferred to refill team.   1. Which medications need to be refilled? (please list name of each medication and dose if known)   potassium chloride SA (KLOR-CON M) 20 MEQ tablet     2. Would you like to learn more about the convenience, safety, & potential cost savings by using the [facility]?    3. Are you open to using the [facility] (Type [facility].    4. Which pharmacy/location (including street and city if local pharmacy) is medication to be sent to? [id number] - [place], [place] - [address] AT [facility] OF [address]     5. Do they need a 30 day or 90 day supply? 90 days

## 2023-03-30 ENCOUNTER — Ambulatory Visit: Payer: Medicare Other | Admitting: Cardiovascular Disease

## 2023-06-15 ENCOUNTER — Other Ambulatory Visit: Payer: Self-pay | Admitting: Cardiovascular Disease

## 2023-07-17 ENCOUNTER — Other Ambulatory Visit: Payer: Self-pay | Admitting: Cardiovascular Disease

## 2023-07-18 ENCOUNTER — Ambulatory Visit: Payer: Medicare Other | Admitting: Physician Assistant

## 2023-07-23 ENCOUNTER — Other Ambulatory Visit: Payer: Self-pay | Admitting: Cardiovascular Disease

## 2023-07-23 ENCOUNTER — Telehealth: Payer: Self-pay | Admitting: Cardiovascular Disease

## 2023-07-23 MED ORDER — AMLODIPINE BESYLATE 5 MG PO TABS: 5.0000 mg | ORAL_TABLET | Freq: Every day | ORAL | 0 refills | Status: DC

## 2023-07-23 NOTE — Telephone Encounter (Signed)
*  STAT* If patient is at the pharmacy, call can be transferred to refill team.   1. Which medications need to be refilled? (please list name of each medication and dose if known)   amLODipine (NORVASC) 5 MG tablet   2. Would you like to learn more about the convenience, safety, & potential cost savings by using the [facility]?   3. Are you open to using the [facility] (Type [facility]. ).  4. Which pharmacy/location (including street and city if local pharmacy) is medication to be sent to?  [id number] - [place], [place] - [address] AT [facility] OF [address]   5. Do they need a 30 day or 90 day supply?  90 day  Patient stated he had 3 tablets left.

## 2023-07-23 NOTE — Telephone Encounter (Signed)
Patient has not been seen in the office since 2023.

## 2023-07-31 ENCOUNTER — Encounter: Payer: Self-pay | Admitting: Cardiovascular Disease

## 2023-07-31 NOTE — Progress Notes (Unsigned)
Cardiology Office Note   Date:  08/03/2023   ID:  Robert Bright, DOB 09-30-1930, MRN [id number]  PCP:  Irven Coe, MD  Cardiologist:  Previous Cassell Clement MD,  Now Emilyann Banka   Chief Complaint  Patient presents with   Hypertension        Follow-up    6 months      Notes from Dr. Janalee Dane is a 89 y.o. male who presents for scheduled 6 month follow-up visit.  He is a retired Research officer, political party.[name] He has a history of essential hypertension and a history of hypercholesterolemia. He does not have any history of chest pain or ischemic heart disease. He had a normal nuclear stress test in 2004. He does have a past history of prostate cancer and a more recent history of bladder cancer. Dr. Laverle Patter is his urologist.  Since last visit he has been feeling well .  When we last saw him [place]  However he states that the events to many doctors visits scheduled that they never got to the beach.  He is disappointed in that. From the cardiac standpoint he has not been experiencing chest pain or shortness of breath.  We reviewed his lab work which is satisfactory.  January 04, 2016:  Seen for the first time today .  Transfer from Dr. Patty Sermons Followed for HTN and hyperlipidemia  Used to work for [employer] ( bought by [employer])   worked as a Designer, television/film set - narcotics , Sold BC pills, ativan, phenergan,  He gets Pfizer meds for free   HR has been slow but denies any syncope or presyncope   Jan. 31, 2018:   Robert Bright is seen back today for his HTN and hyperlipidemia .    Had a skin cancer removed from his nose .  Also has had a bladder cancer removed.   April 17, 2018:  Robert Bright is seen today ( with daughter, Karoline Caldwell)   for follow-up of his hypertension and hyperlipidemia. He was seen by Vin  in May, 2019. Has been seen by Dr. Glenna Fellows ( nephrology )  She stopped the Diovan several weeks ago . BP has been a bit  elevated ( by his BP cuff)  Eats at [employer] almost every day  Still eats a very salty diet.  Eats [employer] sandwiches 3-4 times a week.  He has chicken noodle soup almost every evening.  Nov. 24, 2020 Seen with daughter Windle Guard is seen today for follow-up of his hypertension and hyperlipidemia.  He also has chronic kidney disease.  I saw him several years ago.    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   Is a [place] graduate .   We discuss the [employer]  / [employer] rivalry .    Nov. 29, 2021: Robert Bright is seen today for follow up of his HTN and HLD ,  Seen with daughter  , Karoline Caldwell and wife.  Is a [employer] grad.   [place] beat [employer] 45-0 this past weekend.  Walks occasionally   Nov. 28, 2022 Robert Bright is seen today for folllow up of his HTN and HLD . Is a [employer].   [place] [employer]( 37[place]14[place]  this past weekend No CP , no dyspnea.  Has some leg swelling  Legs ache on occasion    Dec. 4, 2023   Robert Bright is seen today for follow of HLD,  Shanon Rosser,  he discuss the [place] [employer] rivalry  [employer] [place] .   No CP ,  Minimal leg swelling  Has had several urology procedure Crecencio Mc, MD )  Hx of HTN   Feb. 14, 2025 Seen with daughter, Alvino Chapel is seen for follow up of his HLD, HTN BP is pretty good at home .   Does not add salt  Lives in assisted living .  The food may be salted before      Past Medical History:  Diagnosis Date   Anxiety    Arthritis    Bladder cancer (HCC) 1993   Being followed by Dr. Laverle Patter   ED (erectile dysfunction)    GERD (gastroesophageal reflux disease)    pt. denies   Heart murmur    Hypercholesterolemia    Hyperlipidemia    Hypertension    with a component of whitecoat syndrome   Prostate cancer (HCC) 1993   Hx ostatectomyof remote treated with radial pr   Shingles    Stress incontinence    Ureteral stricture    Vertigo     Past Surgical History:  Procedure Laterality Date   BALLOON DILATION N/A 04/20/2022    Procedure: BALLOON DILATION, CYSTO optilume;  Surgeon: Heloise Purpura, MD;  Location: [facility];  Service: Urology;  Laterality: N/A;   BLADDER TUMOR EXCISION     CARDIOVASCULAR STRESS TEST  01/02/2003   EF 68%   COLONOSCOPY     CYSTOSCOPY     CYSTOSCOPY W/ RETROGRADES Bilateral 04/24/2016   Procedure: CYSTOSCOPY WITH BILATERAL RETROGRADE PYELOGRAM;  Surgeon: Heloise Purpura, MD;  Location: [facility];  Service: Urology;  Laterality: Bilateral;   CYSTOSCOPY WITH URETHRAL DILATATION N/A 01/19/2022   Procedure: CYSTOSCOPY WITH  BALLOON URETHRAL DILATATION;  Surgeon: Heloise Purpura, MD;  Location: [facility];  Service: Urology;  Laterality: N/A;   CYSTOSTOMY W/ BLADDER BIOPSY     EYE SURGERY     cataract   INGUINAL HERNIA REPAIR     LYMPHADENECTOMY     PROSTATECTOMY  06/20/1991   TRANSURETHRAL RESECTION OF BLADDER TUMOR N/A 04/24/2016   Procedure: TRANSURETHRAL RESECTION OF BLADDER TUMOR (TURBT);  Surgeon: Heloise Purpura, MD;  Location: [facility];  Service: Urology;  Laterality: N/A;     Current Outpatient Medications  Medication Sig Dispense Refill   acetaminophen (TYLENOL) 500 MG tablet Take 1,000 mg by mouth every 6 (six) hours as needed for mild pain.     amLODipine (NORVASC) 5 MG tablet Take 1 tablet (5 mg total) by mouth daily. 90 tablet 0   atorvastatin (LIPITOR) 20 MG tablet TAKE 1 TABLET(20 MG) BY MOUTH DAILY 90 tablet 0   desonide (DESOWEN) 0.05 % cream Apply 1 Application topically 2 (two) times daily as needed (irritation).     furosemide (LASIX) 20 MG tablet Take 1 tablet (20 mg total) by mouth daily. 90 tablet 3   LORazepam (ATIVAN) 0.5 MG tablet TAKE 1 TABLET BY MOUTH EVERY DAY AS NEEDED FOR ANXIETY 30 tablet 3   melatonin 5 MG TABS Take 5 mg by mouth at bedtime as needed (sleep).     Multiple Vitamins-Minerals (MULTIVITAMIN WITH MINERALS) tablet Take 1 tablet by mouth daily.     potassium chloride SA (KLOR-CON M) 20 MEQ tablet TAKE 1 TABLET(20 MEQ) BY MOUTH DAILY 90 tablet 0   vitamin B-12  (CYANOCOBALAMIN) 100 MCG tablet Take 100 mcg by mouth daily.     No current facility-administered medications for this  visit.    Allergies:   Altace [ramipril]    Social History:  The patient  reports that he quit smoking about 40 years ago. His smoking use included cigars. He has never used smokeless tobacco. He reports that he does not drink alcohol and does not use drugs.   Family History:  The patient's family history includes Arthritis/Rheumatoid in his father; Dementia in his mother; Healthy in his sister and sister.    ROS:  Please see the history of present illness.   Otherwise, review of systems are positive for none.   All other systems are reviewed and negative.    Physical Exam: Blood pressure (!) 160/88, pulse 82, resp. rate 16, height 5\' 10"  (1.778 m), weight 160 lb (72.6 kg), SpO2 98%.  HYPERTENSION CONTROL Vitals:   08/03/23 1537 08/03/23 1543  BP: (!) 182/90 (!) 160/88    The patient's blood pressure is elevated above target today.  In order to address the patient's elevated BP:        GEN: Elderly, very pleasant gentleman in no acute distress HEENT: Normal NECK: No JVD; No carotid bruits LYMPHATICS: No lymphadenopathy CARDIAC: RRR , no murmurs, rubs, gallops RESPIRATORY:  Clear to auscultation without rales, wheezing or rhonchi  ABDOMEN: Soft, non-tender, non-distended MUSCULOSKELETAL:  No edema; No deformity  SKIN: Warm and dry NEUROLOGIC:  Alert and oriented x 3     EKG:    EKG Interpretation Date/Time:  Friday August 03 2023 15:41:38 EST Ventricular Rate:  79 PR Interval:  264 QRS Duration:  138 QT Interval:  386 QTC Calculation: 442 R Axis:   -44  Text Interpretation: Sinus rhythm with 1st degree A-V block Left axis deviation Right bundle branch block When compared with ECG of 14-Aug-2021 15:32, No significant change since last tracing Confirmed by Kristeen Miss ([id number]) on 08/03/2023 4:40:03 PM      Recent Labs: No results found for  requested labs within last 365 days.    Lipid Panel    Component Value Date/Time   CHOL 137 04/01/2021 1136   CHOL 255 (H) 10/17/2017 1101   TRIG 97 04/01/2021 1136   HDL 54 04/01/2021 1136   HDL 96 10/17/2017 1101   CHOLHDL 2.5 04/01/2021 1136   VLDL 25 06/26/2016 1113   LDLCALC 65 04/01/2021 1136      Wt Readings from Last 3 Encounters:  08/03/23 160 lb (72.6 kg)  05/22/22 164 lb 6.4 oz (74.6 kg)  04/20/22 171 lb 15.3 oz (78 kg)        ASSESSMENT AND PLAN:  1. essential hypertension:   Blood pressure was normal upon recheck.  2. Hyperlipidemia -      3. right bundle branch block  ;   Right bundle branch block is stable.   4. Sinus bradycardia:       Current medicines are reviewed at length with the patient today.  The patient does not have concerns regarding medicines.  The following changes have been made:  no change  Labs/ tests ordered today include:   Orders Placed This Encounter  Procedures   EKG 12-Lead     Follow up in 1 year    Kristeen Miss, MD  08/03/2023 4:24 PM    [facility] [address]  [address] [place], [place]  [address] Pager [phone] Phone: [phone]; Fax: [phone]

## 2023-08-03 ENCOUNTER — Ambulatory Visit: Payer: Medicare Other | Attending: Cardiovascular Disease | Admitting: Cardiovascular Disease

## 2023-08-03 VITALS — BP 130/70 | HR 82 | Resp 16 | Ht 70.0 in | Wt 160.0 lb

## 2023-08-03 DIAGNOSIS — E785 Hyperlipidemia, unspecified: Secondary | ICD-10-CM

## 2023-08-03 DIAGNOSIS — I10 Essential (primary) hypertension: Secondary | ICD-10-CM | POA: Diagnosis not present

## 2023-08-03 MED ORDER — POTASSIUM CHLORIDE CRYS ER 20 MEQ PO TBCR: 20.0000 meq | EXTENDED_RELEASE_TABLET | Freq: Every day | ORAL | 3 refills | Status: AC

## 2023-08-03 MED ORDER — AMLODIPINE BESYLATE 5 MG PO TABS: 5.0000 mg | ORAL_TABLET | Freq: Every day | ORAL | 3 refills | Status: DC

## 2023-08-03 MED ORDER — FUROSEMIDE 20 MG PO TABS: 20.0000 mg | ORAL_TABLET | Freq: Every day | ORAL | 3 refills | Status: AC

## 2023-08-03 MED ORDER — ATORVASTATIN CALCIUM 20 MG PO TABS: 20.0000 mg | ORAL_TABLET | Freq: Every day | ORAL | 3 refills | Status: AC

## 2023-08-03 NOTE — Patient Instructions (Signed)
Follow-Up: At [facility], you and your health needs are our priority.  As part of our continuing mission to provide you with exceptional heart care, we have created designated Provider Care Teams.  These Care Teams include your primary Cardiologist (physician) and Advanced Practice Providers (APPs -  Physician Assistants and Nurse Practitioners) who all work together to provide you with the care you need, when you need it.  We recommend signing up for the patient portal called "MyChart".  Sign up information is provided on this After Visit Summary.  MyChart is used to connect with patients for Virtual Visits (Telemedicine).  Patients are able to view lab/test results, encounter notes, upcoming appointments, etc.  Non-urgent messages can be sent to your provider as well.   To learn more about what you can do with MyChart, go to [email or web address].    Your next appointment:   1 year(s)  Provider:   Kristeen Miss, MD     Other Instructions   1st Floor: - Lobby - Registration  - Pharmacy  - Lab - Cafe  2nd Floor: - PV Lab - Diagnostic Testing (echo, CT, nuclear med)  3rd Floor: - Vacant  4th Floor: - TCTS (cardiothoracic surgery) - AFib Clinic - Structural Heart Clinic - Vascular Surgery  - Vascular Ultrasound  5th Floor: - HeartCare Cardiology (general and EP) - Clinical Pharmacy for coumadin, hypertension, lipid, weight-loss medications, and med management appointments    Valet parking services will be available as well.

## 2023-08-03 NOTE — Addendum Note (Signed)
Addended by: Erick Alley on: 08/03/2023 04:47 PM   Modules accepted: Orders

## 2023-09-14 ENCOUNTER — Telehealth: Payer: Self-pay | Admitting: Cardiovascular Disease

## 2023-09-14 NOTE — Telephone Encounter (Signed)
 Pt's medications were already sent to pt's pharmacy on 08/03/2023 with a year supply. Confirmation for all of the prescriptions.

## 2023-09-14 NOTE — Telephone Encounter (Signed)
*  STAT* If patient is at the pharmacy, call can be transferred to refill team.   1. Which medications need to be refilled? (please list name of each medication and dose if known) Potassium Chloride, Amlodipine, Atorvastatin,    2. Would you like to learn more about the convenience, safety, & potential cost savings by using the [facility]?    3. Are you open to using the [facility] (Type [facility].   4. Which pharmacy/location (including street and city if local pharmacy) is medication to be sent to?[name] and [facility]   5. Do they need a 30 day or 90 day supply? 90 days and refills

## 2023-09-24 ENCOUNTER — Other Ambulatory Visit: Payer: Self-pay | Admitting: Cardiovascular Disease

## 2023-10-18 LAB — LAB REPORT - SCANNED
A1c: 5.6
Creatinine, POC: 107 mg/dL
EGFR: 28

## 2024-08-20 ENCOUNTER — Ambulatory Visit: Admitting: Cardiology
